# Patient Record
Sex: Male | Born: 1960
Health system: Southern US, Community
[De-identification: ages and names within clinical notes are randomized; demographics above are authoritative.]

## PROBLEM LIST (undated history)

## (undated) DIAGNOSIS — Z8619 Personal history of other infectious and parasitic diseases: Secondary | ICD-10-CM

## (undated) DIAGNOSIS — R739 Hyperglycemia, unspecified: Principal | ICD-10-CM

## (undated) DIAGNOSIS — N433 Hydrocele, unspecified: Secondary | ICD-10-CM

## (undated) DIAGNOSIS — T7840XA Allergy, unspecified, initial encounter: Secondary | ICD-10-CM

## (undated) DIAGNOSIS — I10 Essential (primary) hypertension: Secondary | ICD-10-CM

## (undated) DIAGNOSIS — N50812 Left testicular pain: Secondary | ICD-10-CM

## (undated) DIAGNOSIS — Z0000 Encounter for general adult medical examination without abnormal findings: Secondary | ICD-10-CM

## (undated) DIAGNOSIS — E785 Hyperlipidemia, unspecified: Secondary | ICD-10-CM

## (undated) DIAGNOSIS — D1720 Benign lipomatous neoplasm of skin and subcutaneous tissue of unspecified limb: Secondary | ICD-10-CM

## (undated) DIAGNOSIS — K219 Gastro-esophageal reflux disease without esophagitis: Secondary | ICD-10-CM

## (undated) HISTORY — DX: Personal history of other infectious and parasitic diseases: Z86.19

## (undated) HISTORY — DX: Gastro-esophageal reflux disease without esophagitis: K21.9

## (undated) HISTORY — PX: HERNIA REPAIR: SHX51

## (undated) HISTORY — DX: Hyperlipidemia, unspecified: E78.5

## (undated) HISTORY — DX: Allergy, unspecified, initial encounter: T78.40XA

## (undated) HISTORY — PX: WISDOM TOOTH EXTRACTION: SHX21

## (undated) HISTORY — DX: Hydrocele, unspecified: N43.3

## (undated) HISTORY — DX: Encounter for general adult medical examination without abnormal findings: Z00.00

## (undated) HISTORY — DX: Hyperglycemia, unspecified: R73.9

## (undated) HISTORY — DX: Essential (primary) hypertension: I10

## (undated) HISTORY — DX: Left testicular pain: N50.812

## (undated) HISTORY — PX: EYE SURGERY: SHX253

## (undated) HISTORY — DX: Benign lipomatous neoplasm of skin and subcutaneous tissue of unspecified limb: D17.20

---
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
## 1998-02-02 ENCOUNTER — Ambulatory Visit (HOSPITAL_BASED_OUTPATIENT_CLINIC_OR_DEPARTMENT_OTHER): Admission: RE | Admit: 1998-02-02 | Discharge: 1998-02-02 | Payer: Self-pay | Admitting: General Surgery

## 1999-03-21 ENCOUNTER — Encounter: Payer: Self-pay | Admitting: Family Medicine

## 1999-03-21 ENCOUNTER — Encounter: Admission: RE | Admit: 1999-03-21 | Discharge: 1999-03-21 | Payer: Self-pay | Admitting: Family Medicine

## 2003-03-23 ENCOUNTER — Encounter: Payer: Self-pay | Admitting: Internal Medicine

## 2003-03-30 ENCOUNTER — Encounter: Payer: Self-pay | Admitting: Internal Medicine

## 2007-08-24 ENCOUNTER — Encounter: Payer: Self-pay | Admitting: Internal Medicine

## 2008-02-03 ENCOUNTER — Ambulatory Visit: Payer: Self-pay | Admitting: Internal Medicine

## 2008-02-03 DIAGNOSIS — E782 Mixed hyperlipidemia: Secondary | ICD-10-CM | POA: Insufficient documentation

## 2008-02-03 DIAGNOSIS — J309 Allergic rhinitis, unspecified: Secondary | ICD-10-CM | POA: Insufficient documentation

## 2008-02-03 DIAGNOSIS — K219 Gastro-esophageal reflux disease without esophagitis: Secondary | ICD-10-CM | POA: Insufficient documentation

## 2008-02-03 DIAGNOSIS — E669 Obesity, unspecified: Secondary | ICD-10-CM | POA: Insufficient documentation

## 2008-02-03 DIAGNOSIS — I10 Essential (primary) hypertension: Secondary | ICD-10-CM | POA: Insufficient documentation

## 2008-02-03 LAB — CONVERTED CEMR LAB
ALT: 56 units/L — ABNORMAL HIGH (ref 0–53)
AST: 42 units/L — ABNORMAL HIGH (ref 0–37)
Albumin: 4.6 g/dL (ref 3.5–5.2)
Alkaline Phosphatase: 47 units/L (ref 39–117)
BUN: 18 mg/dL (ref 6–23)
Bilirubin, Direct: 0.3 mg/dL (ref 0.0–0.3)
CO2: 28 meq/L (ref 19–32)
Calcium: 9.9 mg/dL (ref 8.4–10.5)
Chloride: 100 meq/L (ref 96–112)
Cholesterol: 129 mg/dL (ref 0–200)
Creatinine, Ser: 1.4 mg/dL (ref 0.4–1.5)
GFR calc Af Amer: 70 mL/min
GFR calc non Af Amer: 58 mL/min
Glucose, Bld: 125 mg/dL — ABNORMAL HIGH (ref 70–99)
HDL: 30.7 mg/dL — ABNORMAL LOW (ref >39.0)
Hgb A1c MFr Bld: 6.1 % — ABNORMAL HIGH (ref 4.6–6.0)
LDL Cholesterol: 79 mg/dL (ref 0–99)
Potassium: 3.8 meq/L (ref 3.5–5.1)
Sodium: 138 meq/L (ref 135–145)
TSH: 1.16 microintl units/mL (ref 0.35–5.50)
Total Bilirubin: 1.5 mg/dL — ABNORMAL HIGH (ref 0.3–1.2)
Total CHOL/HDL Ratio: 4.2
Total Protein: 7.8 g/dL (ref 6.0–8.3)
Triglycerides: 97 mg/dL (ref 0–149)
VLDL: 19 mg/dL (ref 0–40)

## 2008-07-19 ENCOUNTER — Telehealth (INDEPENDENT_AMBULATORY_CARE_PROVIDER_SITE_OTHER): Payer: Self-pay | Admitting: *Deleted

## 2008-08-15 ENCOUNTER — Ambulatory Visit: Payer: Self-pay | Admitting: Internal Medicine

## 2008-08-15 DIAGNOSIS — R739 Hyperglycemia, unspecified: Secondary | ICD-10-CM | POA: Insufficient documentation

## 2008-08-15 DIAGNOSIS — E1169 Type 2 diabetes mellitus with other specified complication: Secondary | ICD-10-CM | POA: Insufficient documentation

## 2008-08-15 HISTORY — DX: Hyperglycemia, unspecified: R73.9

## 2008-11-09 ENCOUNTER — Ambulatory Visit: Payer: Self-pay | Admitting: Internal Medicine

## 2008-11-09 LAB — CONVERTED CEMR LAB
ALT: 37 units/L (ref 0–53)
AST: 26 units/L (ref 0–37)
Albumin: 4.7 g/dL (ref 3.5–5.2)
Alkaline Phosphatase: 49 units/L (ref 39–117)
BUN: 14 mg/dL (ref 6–23)
Bilirubin, Direct: 0.2 mg/dL (ref 0.0–0.3)
CO2: 25 meq/L (ref 19–32)
Calcium: 9.7 mg/dL (ref 8.4–10.5)
Chloride: 100 meq/L (ref 96–112)
Creatinine, Ser: 1.21 mg/dL (ref 0.40–1.50)
Glucose, Bld: 105 mg/dL — ABNORMAL HIGH (ref 70–99)
Hgb A1c MFr Bld: 5.9 % (ref 4.6–6.1)
Indirect Bilirubin: 0.4 mg/dL (ref 0.0–0.9)
Potassium: 4.8 meq/L (ref 3.5–5.3)
Sodium: 138 meq/L (ref 135–145)
Total Bilirubin: 0.6 mg/dL (ref 0.3–1.2)
Total Protein: 7.2 g/dL (ref 6.0–8.3)

## 2008-11-16 ENCOUNTER — Ambulatory Visit: Payer: Self-pay | Admitting: Internal Medicine

## 2008-11-16 DIAGNOSIS — R0789 Other chest pain: Secondary | ICD-10-CM | POA: Insufficient documentation

## 2009-03-14 ENCOUNTER — Ambulatory Visit: Payer: Self-pay | Admitting: Internal Medicine

## 2009-03-14 LAB — CONVERTED CEMR LAB
ALT: 39 units/L (ref 0–53)
AST: 23 units/L (ref 0–37)
Albumin: 4.4 g/dL (ref 3.5–5.2)
Alkaline Phosphatase: 46 units/L (ref 39–117)
BUN: 15 mg/dL (ref 6–23)
Bilirubin, Direct: 0.2 mg/dL (ref 0.0–0.3)
CO2: 25 meq/L (ref 19–32)
Calcium: 9.1 mg/dL (ref 8.4–10.5)
Chloride: 101 meq/L (ref 96–112)
Cholesterol: 125 mg/dL (ref 0–200)
Creatinine, Ser: 1.14 mg/dL (ref 0.40–1.50)
Glucose, Bld: 105 mg/dL — ABNORMAL HIGH (ref 70–99)
HDL: 34 mg/dL — ABNORMAL LOW (ref >39)
Hgb A1c MFr Bld: 6 % (ref 4.6–6.1)
Indirect Bilirubin: 0.5 mg/dL (ref 0.0–0.9)
LDL Cholesterol: 56 mg/dL (ref 0–99)
Potassium: 4.5 meq/L (ref 3.5–5.3)
Sodium: 140 meq/L (ref 135–145)
Total Bilirubin: 0.7 mg/dL (ref 0.3–1.2)
Total CHOL/HDL Ratio: 3.7
Total Protein: 6.7 g/dL (ref 6.0–8.3)
Triglycerides: 173 mg/dL — ABNORMAL HIGH (ref <150)
VLDL: 35 mg/dL (ref 0–40)

## 2009-03-16 ENCOUNTER — Ambulatory Visit: Payer: Self-pay | Admitting: Internal Medicine

## 2009-08-24 ENCOUNTER — Encounter: Payer: Self-pay | Admitting: Internal Medicine

## 2009-08-24 LAB — CONVERTED CEMR LAB
BUN: 15 mg/dL (ref 6–23)
CO2: 27 meq/L (ref 19–32)
Calcium: 9.2 mg/dL (ref 8.4–10.5)
Chloride: 102 meq/L (ref 96–112)
Creatinine, Ser: 1.28 mg/dL (ref 0.40–1.50)
Glucose, Bld: 106 mg/dL — ABNORMAL HIGH (ref 70–99)
Hgb A1c MFr Bld: 5.7 % — ABNORMAL HIGH (ref <5.7)
Potassium: 4.6 meq/L (ref 3.5–5.3)
Sodium: 137 meq/L (ref 135–145)

## 2009-08-29 ENCOUNTER — Ambulatory Visit: Payer: Self-pay | Admitting: Internal Medicine

## 2009-10-01 ENCOUNTER — Telehealth: Payer: Self-pay | Admitting: Internal Medicine

## 2010-02-05 NOTE — Assessment & Plan Note (Signed)
Summary: 6 month follow up/mhf   Vital Signs:  Patient profile:   50 year old male Height:      71 inches Weight:      300.50 pounds BMI:     42.06 O2 Sat:      96 % on Room air Temp:     98.3 degrees F oral Pulse rate:   75 / minute Pulse rhythm:   regular Resp:     16 per minute BP sitting:   122 / 80  (right arm) Cuff size:   large  Vitals Entered By: Glendell Docker CMA (August 29, 2009 8:04 AM)  O2 Flow:  Room air CC: 6 month follow up Is Patient Diabetic? Yes Pain Assessment Patient in pain? no      [facility]   Primary Care Provider:  D. Thomos Lemons DO  CC:  6 month follow up.  History of Present Illness:  Hypertension Follow-Up      This is a 50 year old man who presents for Hypertension follow-up.  The patient denies lightheadedness and edema.  The patient denies the following associated symptoms: chest pain.  Compliance with medications (by patient report) has been near 100%.  The patient reports that dietary compliance has been fair.    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   Head:  normocephalic and atraumatic.   Neck:  No deformities, masses, or tenderness noted.no carotid bruits.   Lungs:  normal respiratory effort and normal breath sounds.   Heart:   normal rate, regular rhythm, no murmur, and no gallop.   Extremities:  trace left pedal edema and trace right pedal edema.     Impression & Recommendations:  Problem # 1:  DIABETES MELLITUS, TYPE II, BORDERLINE (ICD-790.29) Assessment Unchanged  His updated medication list for this problem includes:    Metformin Hcl 500 Mg Tabs (Metformin hcl) ..... One by mouth three times a day  Labs Reviewed: Creat: 1.28 (08/24/2009)     Problem # 2:  HYPERTENSION (ICD-401.9) Assessment: Unchanged  His updated medication list for this problem includes:    Chlorthalidone 25 Mg Tabs (Chlorthalidone) .Marland Kitchen... Take 1 tablet by mouth once a day    Lisinopril 20 Mg Tabs (Lisinopril) .Marland Kitchen... Take 1 tablet by mouth once a day  BP today: 122/80 Prior BP: 122/80 (03/16/2009)  Labs Reviewed: K+: 4.6 (08/24/2009) Creat: : 1.28 (08/24/2009)   Chol: 125 (03/14/2009)   HDL: 34 (03/14/2009)   LDL: 56 (03/14/2009)   TG: 173 (03/14/2009)  Complete Medication List: 1)  Chlorthalidone 25 Mg Tabs (Chlorthalidone) .... Take 1 tablet by mouth once a day 2)  Lipitor 10 Mg Tabs (Atorvastatin calcium) .... Take 1 tablet by mouth once a  day 3)  Lisinopril 20 Mg Tabs (Lisinopril) .... Take 1 tablet by mouth once a day 4)  Flonase 50 Mcg/act Susp (Fluticasone propionate) .... 2 sprays each nostril once daily as needed 5)  Proair Hfa 108 (90 Base) Mcg/act Aers (Albuterol sulfate) .Marland Kitchen.. 1-2 puffs by mouth once daily as needed 6)  Metformin Hcl 500 Mg Tabs (Metformin hcl) .... One by mouth three times a day  Patient Instructions: 1)  [email or web address]-calorie-counter.[email or web address]/ 2)  Please schedule a follow-up appointment in 6 months. 3)  BMP prior to visit, ICD-9:  401.9 4)  HbgA1C prior to visit, ICD-9:  790.29 5)  FLP, AST, ALT: 272.4 6)  Please return for lab work one (1) week before your next appointment.  Prescriptions: METFORMIN HCL 500 MG TABS (METFORMIN HCL) one by mouth three times a day  #270 x 1   Entered and Authorized  by:   D. Thomos Lemons DO   Signed by:   D. Thomos Lemons DO on 08/29/2009   Method used:   [facility]        [facility] [phone]* (retail)       [address]       [place], [place]  [address]       Ph: [phone]       Fax: [phone]   RxID:   [id number] LISINOPRIL 20 MG TABS (LISINOPRIL) Take 1 tablet by mouth once a day  #90 x 1   Entered and Authorized by:   D. Thomos Lemons DO   Signed by:   D. Thomos Lemons DO on 08/29/2009   Method used:   [facility] to        [facility] [phone]* (retail)       [address]       [place], [place]  [address]       Ph: [phone]       Fax: [phone]   RxID:   [id number] LIPITOR 10 MG TABS (ATORVASTATIN CALCIUM) Take 1 tablet by mouth once a day  #90 x 1   Entered and Authorized by:   D. Thomos Lemons DO   Signed by:   D. Thomos Lemons DO on 08/29/2009   Method used:   [facility]ically to        [facility] [phone]* (retail)       [address]       [place], [place]  [address]       Ph: [phone]       Fax: [phone]   RxID:   [id number] CHLORTHALIDONE 25 MG TABS (CHLORTHALIDONE) Take 1 tablet by mouth once a day  #90 x 1   Entered and Authorized by:   D. Thomos Lemons DO   Signed by:   D. Thomos Lemons DO on 08/29/2009   Method used:   [facility] to        [facility] [phone]* (retail)       [address]       [place], [place]  [address]       Ph: [phone]       Fax: [phone]   RxID:   [id number]   Current Allergies (reviewed today): No known allergies   Prevention & Chronic Care Immunizations   Influenza vaccine: Fluvax Non-MCR  (10/06/2008)  Tetanus booster: 02/03/2008: Tdap    Pneumococcal vaccine: Not documented  Other Screening   Smoking status: never  (08/29/2009)  Lipids   Total Cholesterol: 125  (03/14/2009)   LDL: 56  (03/14/2009)   LDL Direct: Not  documented   HDL: 34  (03/14/2009)   Triglycerides: 173  (03/14/2009)    SGOT (AST): 23  (03/14/2009)   SGPT (ALT): 39  (03/14/2009)   Alkaline phosphatase: 46  (03/14/2009)   Total bilirubin: 0.7  (03/14/2009)  Hypertension   Last Blood Pressure: 122 / 80  (08/29/2009)   Serum creatinine: 1.28  (08/24/2009)   Serum potassium 4.6  (08/24/2009)  Self-Management Support :    Hypertension self-management support: Not documented    Lipid self-management support: Not documented

## 2010-02-05 NOTE — Assessment & Plan Note (Signed)
Summary: 3 MONTH FOLLOW UP/MHF rsc with pt from bump/mhf   Vital Signs:  Patient profile:   50 year old male Weight:      318.50 pounds BMI:     44.58 O2 Sat:      97 % on Room air Temp:     98.2 degrees F oral Pulse rate:   81 / minute Pulse rhythm:   regular Resp:     20 per minute BP sitting:   122 / 80  (right arm) Cuff size:   large  Vitals Entered By: Glendell Docker CMA (March 16, 2009 8:05 AM)  O2 Flow:  Room air CC: Rm 2- 3 Month Folllow up , Type 2 diabetes mellitus follow-up Is Patient Diabetic? No Comments no concerns, refills on flonase and pro air   Primary Care Provider:  DThomos Lemons DO  CC:  Rm 2- 3 Month Folllow up  and Type 2 diabetes mellitus follow-up.  History of Present Illness:        This is a 50 year old man who presents for Type 2 diabetes mellitus borderline follow-up.  The patient reports weight gain.  The patient denies the following symptoms: chest pain.  Since the last visit the patient reports good dietary compliance, noncompliance with medications, and not exercising regularly.  He stopped drinking sodas.  Htn - stable. occ gets gout flare  Hyperlipidemia - stable.  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   Lungs:  normal respiratory effort and normal breath sounds.   Heart:  normal rate, regular rhythm, no murmur, and no gallop.   Extremities:  trace left pedal edema and trace right pedal edema.   Psych:  normally interactive and good eye contact.     Impression & Recommendations:  Problem  # 1:  DIABETES MELLITUS, TYPE II, BORDERLINE (ICD-790.29) He hasn't been taking consistently due to nausea.  pt will take 1/2 tab is afternoon.  Pt advised to join wt watchers.  we discussed option of wt loss surgery.  His updated medication list for this problem includes:    Metformin Hcl 500 Mg Tabs (Metformin hcl) ..... One by mouth three times a day  Problem # 2:  HYPERTENSION (ICD-401.9) well controlled.  he gets occ gout flares (2-3 per yr).  if more frequent gout flares, replace chlorthalidone with amlodipine  His updated medication list for this problem includes:    Chlorthalidone 25 Mg Tabs (Chlorthalidone) .Marland Kitchen... Take 1 tablet by mouth once a day    Lisinopril 20 Mg Tabs (Lisinopril) .Marland Kitchen... Take 1 tablet by mouth once a day  BP today: 122/80 Prior BP: 124/80 (11/16/2008)  Labs Reviewed: K+: 4.5 (03/14/2009) Creat: : 1.14 (03/14/2009)   Chol: 125 (03/14/2009)   HDL: 34 (03/14/2009)   LDL: 56 (03/14/2009)   TG: 173 (03/14/2009)  Problem # 3:  HYPERLIPIDEMIA (ICD-272.4) well controlled.  Maintain current medication regimen.  His updated medication list for this problem includes:    Lipitor 10 Mg Tabs (Atorvastatin calcium) .Marland Kitchen... Take 1 tablet  by mouth once a day  Labs Reviewed: SGOT: 23 (03/14/2009)   SGPT: 39 (03/14/2009)   HDL:34 (03/14/2009), 30.7 (02/03/2008)  LDL:56 (03/14/2009), 79 (02/03/2008)  Chol:125 (03/14/2009), 129 (02/03/2008)  Trig:173 (03/14/2009), 97 (02/03/2008)  Complete Medication List: 1)  Chlorthalidone 25 Mg Tabs (Chlorthalidone) .... Take 1 tablet by mouth once a day 2)  Lipitor 10 Mg Tabs (Atorvastatin calcium) .... Take 1 tablet by mouth once a day 3)  Lisinopril 20 Mg Tabs (Lisinopril) .... Take 1 tablet by mouth once a day 4)  Flonase 50 Mcg/act Susp (Fluticasone propionate) .... 2 sprays each nostril once daily as needed 5)  Proair Hfa 108 (90 Base) Mcg/act Aers (Albuterol sulfate) .Marland Kitchen.. 1-2 puffs by mouth once daily as needed 6)  Metformin Hcl 500  Mg Tabs (Metformin hcl) .... One by mouth three times a day  Patient Instructions: 1)  Follow up visit between July and August of 2011 2)  BMP prior to visit, ICD-9:  401.9 3)  HbgA1C prior to visit, ICD-9: 790.29 4)  Please return for lab work one (1) week before your next appointment.  Prescriptions: PROAIR HFA 108 (90 BASE) MCG/ACT AERS (ALBUTEROL SULFATE) 1-2 puffs by mouth once daily as needed  #1 x 5   Entered and Authorized by:   D. Thomos Lemons DO   Signed by:   D. Thomos Lemons DO on 03/16/2009   Method used:   [facility] to        [facility] [phone]* (retail)       [address]       [place], [place]  [address]       Ph: [phone]       Fax: [phone]   RxID:   [id number] FLONASE 50 MCG/ACT SUSP (FLUTICASONE PROPIONATE) 2 sprays each nostril once daily as needed  #1 x 5   Entered and Authorized by:   D. Thomos Lemons DO   Signed by:   D. Thomos Lemons DO on 03/16/2009   Method used:   Electronically to        [facility] [phone]* (retail)       [address]       [place], [place]  [address]       Ph: [phone]       Fax: [phone]   RxID:   [id number] METFORMIN HCL 500 MG TABS (METFORMIN HCL) one by mouth three times a day  #90 x 3   Entered and Authorized by:   D. Thomos Lemons DO   Signed by:   D. Thomos Lemons DO on 03/16/2009   Method used:   [facility]ronically to        [facility] [phone]* (retail)       [address]       [place], [place]  [address]       Ph: [phone]       Fax: [phone]   RxID:   [id number] LIPITOR 10 MG TABS (ATORVASTATIN CALCIUM) Take 1 tablet by mouth once a day  #30 x 5   Entered and Authorized by:   D. Thomos Lemons DO   Signed by:   D. Thomos Lemons DO on 03/16/2009   Method used:   Electronically to        [facility] [phone]* (retail)  [address]       [place], [place]  [address]       Ph:  [phone]       Fax: [phone]   RxID:   [id number] LISINOPRIL 20 MG TABS (LISINOPRIL) Take 1 tablet by mouth once a day  #30 x 5   Entered and Authorized by:   D. Thomos Lemons DO   Signed by:   D. Thomos Lemons DO on 03/16/2009   Method used:   Electronically to        [facility] [phone]* (retail)       [address]       [place], [place]  [address]       Ph: [phone]       Fax: [phone]   RxID:   [id number] CHLORTHALIDONE 25 MG TABS (CHLORTHALIDONE) Take 1 tablet by mouth once a day  #30 x 5   Entered and Authorized by:   D. Thomos Lemons DO   Signed by:   D. Thomos Lemons DO on 03/16/2009   Method used:   Electronically to        [facility] [phone]* (retail)       [address]       [place], [place]  [address]       Ph: [phone]       Fax: [phone]   RxID:   [id number]   Current Allergies (reviewed today): No known allergies

## 2010-02-05 NOTE — Miscellaneous (Signed)
Summary: Orders Update  Clinical Lists Changes  Orders: Added new Test order of T-Basic Metabolic Panel ([id number]) - Signed Added new Test order of T- Hemoglobin A1C ([id number]) - Signed 

## 2010-02-05 NOTE — Progress Notes (Signed)
Summary: Refill from 8/24 appt  Phone Note Call from Patient Call back at [phone]   Caller: Patient Reason for Call: Refill Medication Summary of Call: Pt did tried to pick up meds today from 08/29/09 appt and pharmacy did not have them, [facility] [place] not [facility] Initial call taken by: Lannette Donath,  October 01, 2009 2:29 PM  Follow-up for Phone Call        prescriptions that were sent on 8/24, verifed with pharmacist, they did not recieve all of the medications that were previoulsy sent. Rxs information has been provided to pharmacist for medicatiions that were sent by Dr Artist Pais in August for Metformin, Lisinopril, Chlorthalidone, and  Lipitor. Call returned to patient, he is aware rx's have been called to pharmacy Follow-up by: Glendell Docker CMA,  October 01, 2009 5:03 PM

## 2010-02-08 ENCOUNTER — Telehealth: Payer: Self-pay | Admitting: Internal Medicine

## 2010-02-13 LAB — CONVERTED CEMR LAB
BUN: 20 mg/dL (ref 6–23)
Chloride: 101 meq/L (ref 96–112)
Glucose, Bld: 114 mg/dL — ABNORMAL HIGH (ref 70–99)
Hgb A1c MFr Bld: 5.6 % (ref <5.7)
LDL Cholesterol: 77 mg/dL (ref 0–99)
Potassium: 4.4 meq/L (ref 3.5–5.3)
Triglycerides: 137 mg/dL (ref <150)
VLDL: 27 mg/dL (ref 0–40)

## 2010-02-13 NOTE — Progress Notes (Signed)
Summary: question on blood work  Phone Note Call from Patient   Caller: Patient Call For: darlene Reason for Call: Talk to Nurse Summary of Call: pt has appt on 02.09.12 with dr. Artist Pais. he was wondering if he needs to come in early for bloodwork. phone# [phone]. please assist. Initial call taken by: Elba Barman,  February 08, 2010 3:10 PM  Follow-up for Phone Call        call returned to patient at [phone], he has been advised of fasting blood work. He states that he wil be out of town on Monday and Tuesday and will have blood work drawn on Wednesday morning in [facility]. Order printed and sent to lab Follow-up by: Glendell Docker CMA,  February 08, 2010 4:25 PM

## 2010-02-14 ENCOUNTER — Encounter: Payer: Self-pay | Admitting: Internal Medicine

## 2010-02-14 ENCOUNTER — Ambulatory Visit (INDEPENDENT_AMBULATORY_CARE_PROVIDER_SITE_OTHER): Payer: BC Managed Care – PPO | Admitting: Internal Medicine

## 2010-02-14 DIAGNOSIS — D179 Benign lipomatous neoplasm, unspecified: Secondary | ICD-10-CM | POA: Insufficient documentation

## 2010-02-14 DIAGNOSIS — R7309 Other abnormal glucose: Secondary | ICD-10-CM

## 2010-02-14 DIAGNOSIS — E785 Hyperlipidemia, unspecified: Secondary | ICD-10-CM

## 2010-02-14 DIAGNOSIS — I10 Essential (primary) hypertension: Secondary | ICD-10-CM

## 2010-02-15 ENCOUNTER — Encounter (INDEPENDENT_AMBULATORY_CARE_PROVIDER_SITE_OTHER): Payer: Self-pay | Admitting: *Deleted

## 2010-02-19 ENCOUNTER — Encounter: Payer: Self-pay | Admitting: Internal Medicine

## 2010-02-21 NOTE — Letter (Signed)
Summary: Primary Care Consult Scheduled Letter  [place] [facility]  [address]   [place], [place] [address]   Phone: [phone]  Fax: [phone]      02/15/2010 MRN: [id number]  Barry Anthony [address] [place], [place]  [address]  [place]    Dear Mr. ROSOL,      We have scheduled an appointment for you.  At the recommendation of Dr.YOO, we have scheduled you a consult with DR Lindie Spruce, [facility] [place] at 10AM .  Their address is_[address], [place]  . The office phone number is [phone].  If this appointment day and time is not convenient for you, please feel free to call the office of the doctor you are being referred to at the number listed above and reschedule the appointment.     It is important for you to keep your scheduled appointments. We are here to make sure you are given good patient care.     Thank you, Darral Dash Patient Care Coordinator [place] [facility]

## 2010-03-05 NOTE — Letter (Signed)
Summary: [facility]   Imported By: Maryln Gottron 02/28/2010 15:59:24  _____________________________________________________________________  External Attachment:    Type:   Image     Comment:   External Document

## 2010-03-05 NOTE — Assessment & Plan Note (Signed)
Summary: 6 month fu/mhf   Vital Signs:  Patient profile:   50 year old male Height:      71 inches Weight:      298.25 pounds BMI:     41.75 O2 Sat:      97 % on Room air Temp:     98.1 degrees F oral Pulse rate:   84 / minute Resp:     16 per minute BP sitting:   120 / 64  (right arm) Cuff size:   large  Vitals Entered By: Glendell Docker CMA (February 14, 2010 8:11 AM)  O2 Flow:  Room air CC: 6 Month Follow up Is Patient Diabetic? No Pain Assessment Patient in pain? no        Primary Care Provider:  Dondra Spry DO  CC:  6 Month Follow up.  History of Present Illness:  50 y/o male co lump on right shoulder size of ping pong ball he first noticed 2 months ago  Htn - stable  DM II -   Preventive Screening-Counseling & Management  Alcohol-Tobacco     Smoking Status: never  Allergies (verified): No Known Drug Allergies  Past History:  Past Medical History: Allergic rhinitis GERD    Hyperlipidemia  Hypertension   Childhood chickenpox  Past Surgical History: Right knee surgery 1981 Tonsillectomy 1969        Physical Exam  General:  alert, well-developed, and well-nourished.   Lungs:  normal respiratory effort and normal breath sounds.   Heart:  normal rate, regular rhythm, and no gallop.   Neurologic:  cranial nerves II-XII intact.   Skin:  3-4 cm fatty lesion on right shouder   Impression & Recommendations:  Problem # 1:  LIPOMA (ICD-214.9)  pt with small golf ball sized lesion on right shoulder arrange surgical excision  Orders: Surgical Referral (Surgery)  Problem # 2:  DIABETES MELLITUS, TYPE II, BORDERLINE (ICD-790.29) Assessment: Improved exericising much more.  mild wt loss pt will start restricting calorie intake continue metformin for now  His updated medication list for this problem includes:    Metformin Hcl 500 Mg Tabs (Metformin hcl) ..... One by mouth three times a day  Labs Reviewed: Creat: 1.28 (08/24/2009)      Problem # 3:  HYPERLIPIDEMIA (ICD-272.4)  His updated medication list for this problem includes:    Lipitor 10 Mg Tabs (Atorvastatin calcium) .Marland Kitchen... Take 1 tablet by mouth once a day  Labs Reviewed: SGOT: 23 (03/14/2009)   SGPT: 39 (03/14/2009)   HDL:34 (03/14/2009), 30.7 (02/03/2008)  LDL:56 (03/14/2009), 79 (02/03/2008)  Chol:125 (03/14/2009), 129 (02/03/2008)  Trig:173 (03/14/2009), 97 (02/03/2008)  Problem # 4:  HYPERTENSION (ICD-401.9) Assessment: Unchanged  His updated medication list for this problem includes:    Chlorthalidone 25 Mg Tabs (Chlorthalidone) .Marland Kitchen... Take 1 tablet by mouth once a day    Lisinopril 20 Mg Tabs (Lisinopril) .Marland Kitchen... Take 1 tablet by mouth once a day  BP today: 120/64 Prior BP: 122/80 (08/29/2009)  Labs Reviewed: K+: 4.6 (08/24/2009) Creat: : 1.28 (08/24/2009)   Chol: 125 (03/14/2009)   HDL: 34 (03/14/2009)   LDL: 56 (03/14/2009)   TG: 173 (03/14/2009)  Complete Medication List: 1)  Chlorthalidone 25 Mg Tabs (Chlorthalidone) .... Take 1 tablet by mouth once a day 2)  Lipitor 10 Mg Tabs (Atorvastatin calcium) .... Take 1 tablet by mouth once a day 3)  Lisinopril 20 Mg Tabs (Lisinopril) .... Take 1 tablet by mouth once a day 4)  Flonase 50 Mcg/act  Susp (Fluticasone propionate) .... 2 sprays each nostril once daily as needed 5)  Proair Hfa 108 (90 Base) Mcg/act Aers (Albuterol sulfate) .Marland Kitchen.. 1-2 puffs by mouth once daily as needed 6)  Metformin Hcl 500 Mg Tabs (Metformin hcl) .... One by mouth three times a day  Patient Instructions: 1)  Please schedule a follow-up appointment in 4 months. 2)  BMP prior to visit, ICD-9:  401.9 3)  HbgA1C prior to visit, ICD-9: 790.29 4)  Please return for lab work one (1) week before your next appointment.  Prescriptions: METFORMIN HCL 500 MG TABS (METFORMIN HCL) one by mouth three times a day  #270 x 1   Entered and Authorized by:   D. Thomos Lemons DO   Signed by:   D. Thomos Lemons DO on 02/14/2010   Method used:    [facility] to        [facility] [phone]* (retail)       [address]       [place], [place]  [address]       Ph: [phone]       Fax: [phone]   RxID:   [id number] LISINOPRIL 20 MG TABS (LISINOPRIL) Take 1 tablet by mouth once a day  #90 x 1   Entered and Authorized by:   D. Thomos Lemons DO   Signed by:   D. Thomos Lemons DO on 02/14/2010   Method used:   Electronically to        [facility] [phone]* (retail)       [address]       [place], [place]  [address]       Ph: [phone]       Fax: [phone]   RxID:   [id number] LIPITOR 10 MG TABS (ATORVASTATIN CALCIUM) Take 1 tablet by mouth once a day  #90 x 1   Entered and Authorized by:   D. Thomos Lemons DO   Signed by:   D. Thomos Lemons DO on 02/14/2010   Method used:   Electronically to        [facility] [phone]* (retail)       [address]       [place], [place]  [address]       Ph: [phone]       Fax: [phone]   RxID:   [id number] CHLORTHALIDONE 25 MG TABS (CHLORTHALIDONE) Take 1 tablet by mouth once a day  #90 x 1   Entered and Authorized by:   D. Thomos Lemons DO   Signed by:   D. Thomos Lemons DO on 02/14/2010   Method used:   Electronically to        [facility] [phone]* (retail)       [address]       [place], [place]  [address]       Ph: [phone]       Fax: [phone]   RxID:   [id number]    Orders Added: 1)  Surgical Referral [Surgery] 2)  Est. Patient Level III [[address]]   Immunization History:  Influenza Immunization History:    Influenza:  historical (11/07/2009)   Immunization History:  Influenza Immunization History:    Influenza:  Historical (11/07/2009)  Current Allergies (reviewed today): No known allergies

## 2010-03-27 ENCOUNTER — Encounter (HOSPITAL_BASED_OUTPATIENT_CLINIC_OR_DEPARTMENT_OTHER)
Admission: RE | Admit: 2010-03-27 | Discharge: 2010-03-27 | Disposition: A | Discharge status: Home / Self Care | Payer: BC Managed Care – PPO | Source: Ambulatory Visit | Attending: General Surgery | Admitting: General Surgery

## 2010-03-27 LAB — CBC
MCH: 29.3 pg (ref 26.0–34.0)
MCHC: 34.9 g/dL (ref 30.0–36.0)
MCV: 84.1 fL (ref 78.0–100.0)
Platelets: 241 10*3/uL (ref 150–400)
RDW: 12.9 % (ref 11.5–15.5)
WBC: 9.9 10*3/uL (ref 4.0–10.5)

## 2010-03-27 LAB — BASIC METABOLIC PANEL
BUN: 15 mg/dL (ref 6–23)
Calcium: 9.3 mg/dL (ref 8.4–10.5)
Creatinine, Ser: 1.3 mg/dL (ref 0.4–1.5)
GFR calc non Af Amer: 59 mL/min — ABNORMAL LOW (ref >60)

## 2010-03-27 LAB — DIFFERENTIAL
Eosinophils Absolute: 0.2 10*3/uL (ref 0.0–0.7)
Eosinophils Relative: 2 % (ref 0–5)
Lymphs Abs: 3.7 10*3/uL (ref 0.7–4.0)
Monocytes Relative: 8 % (ref 3–12)

## 2010-03-29 ENCOUNTER — Ambulatory Visit (HOSPITAL_BASED_OUTPATIENT_CLINIC_OR_DEPARTMENT_OTHER)
Admission: RE | Admit: 2010-03-29 | Discharge: 2010-03-29 | Disposition: A | Discharge status: Home / Self Care | Payer: BC Managed Care – PPO | Source: Ambulatory Visit | Attending: General Surgery | Admitting: General Surgery

## 2010-03-29 ENCOUNTER — Other Ambulatory Visit: Payer: Self-pay | Admitting: General Surgery

## 2010-03-29 DIAGNOSIS — Z01812 Encounter for preprocedural laboratory examination: Secondary | ICD-10-CM | POA: Insufficient documentation

## 2010-03-29 DIAGNOSIS — E119 Type 2 diabetes mellitus without complications: Secondary | ICD-10-CM | POA: Insufficient documentation

## 2010-03-29 DIAGNOSIS — I10 Essential (primary) hypertension: Secondary | ICD-10-CM | POA: Insufficient documentation

## 2010-03-29 DIAGNOSIS — D1739 Benign lipomatous neoplasm of skin and subcutaneous tissue of other sites: Secondary | ICD-10-CM | POA: Insufficient documentation

## 2010-03-29 LAB — GLUCOSE, CAPILLARY: Glucose-Capillary: 97 mg/dL (ref 70–99)

## 2010-04-10 NOTE — Op Note (Signed)
  NAMEJOEDY, Barry Anthony                 ACCOUNT NO.:  [id number]  MEDICAL RECORD NO.:  [id number]           PATIENT TYPE:  LOCATION:                                 FACILITY:  PHYSICIAN:  Cherylynn Ridges, M.D.         DATE OF BIRTH:  DATE OF PROCEDURE:  03/29/2010 DATE OF DISCHARGE:                              OPERATIVE REPORT   PREOPERATIVE DIAGNOSIS:  Right shoulder lipoma.  POSTOPERATIVE DIAGNOSIS:  Right shoulder lipoma, 4 cm in size.  PROCEDURE:  Excision of right shoulder lipoma.  SURGEON:  Cherylynn Ridges, MD  ANESTHESIA:  Monitored anesthesia care with 1% Xylocaine local.  ESTIMATED BLOOD LOSS:  Less than 10 mL.  COMPLICATIONS:  No complications.  CONDITION:  Stable.  FINDINGS:  A 4-cm lipoma with interdigitation.  INDICATION FOR OPERATION:  The patient is a 50 year old with symptomatic right shoulder lipoma with discomfort and pain especially with certain pieces of apparel who now comes in for excision.  OPERATION:  The patient was taken to the operating room, placed on table in supine position.  After an adequate amount of IV sedation was given, a proper time-out was performed identifying the patient and the procedure to be performed and was prepped and draped in usual sterile manner exposing his right shoulder.  The patient was tilted slightly up, but not to the left side.  We marked the area of the lipoma.  We then anesthetized using 1% Xylocaine local with bicarb added.  Next I directly crossed lipoma then made our incision diagonally directly on top of lipoma.  We dissect down to subcutaneous tissue until we got to the soft capsule of the lipoma, which we used as a guide to extend it circumferentially.  There was interdigitation then went into the subcutaneous tissue to the lateral aspect and down towards the chest wall only a small tongue of tissue was noted.  We excised everything in toto, obtained hemostasis, and closed in three layers, deep layer of 3-0  Vicryl and using a 4-0 Vicryl more superficial layer of 4-0 Vicryl.  Then the skin was closed using running subcuticular stitch of 4-0 Monocryl.  All counts were correct.  Sterile dressings applied including Dermabond, Steri-Strips, and Tegaderm.     Cherylynn Ridges, M.D.     JOW/MEDQ  D:  03/29/2010  T:  03/30/2010  Job:  [id number]  Electronically Signed by Jimmye Norman M.D. on 04/10/2010 05:24:36 PM

## 2010-05-15 ENCOUNTER — Encounter (INDEPENDENT_AMBULATORY_CARE_PROVIDER_SITE_OTHER): Payer: Self-pay | Admitting: General Surgery

## 2010-05-17 ENCOUNTER — Other Ambulatory Visit: Payer: Self-pay | Admitting: *Deleted

## 2010-05-17 DIAGNOSIS — I10 Essential (primary) hypertension: Secondary | ICD-10-CM

## 2010-05-17 DIAGNOSIS — R7309 Other abnormal glucose: Secondary | ICD-10-CM

## 2010-05-18 LAB — BASIC METABOLIC PANEL
BUN: 21 mg/dL (ref 6–23)
CO2: 26 mEq/L (ref 19–32)
Calcium: 9.3 mg/dL (ref 8.4–10.5)
Chloride: 101 mEq/L (ref 96–112)
Creat: 1.21 mg/dL (ref 0.40–1.50)

## 2010-05-18 LAB — HEMOGLOBIN A1C: Mean Plasma Glucose: 111 mg/dL (ref <117)

## 2010-05-20 ENCOUNTER — Encounter: Payer: Self-pay | Admitting: Internal Medicine

## 2010-05-21 ENCOUNTER — Ambulatory Visit: Payer: BC Managed Care – PPO | Admitting: Internal Medicine

## 2010-05-31 ENCOUNTER — Ambulatory Visit: Payer: BC Managed Care – PPO | Admitting: Internal Medicine

## 2010-08-06 ENCOUNTER — Ambulatory Visit: Payer: BC Managed Care – PPO | Admitting: Internal Medicine

## 2010-08-06 DIAGNOSIS — Z0289 Encounter for other administrative examinations: Secondary | ICD-10-CM

## 2010-08-08 ENCOUNTER — Other Ambulatory Visit: Payer: Self-pay | Admitting: Internal Medicine

## 2010-08-22 ENCOUNTER — Telehealth: Payer: Self-pay | Admitting: Internal Medicine

## 2010-08-22 NOTE — Telephone Encounter (Signed)
Refill- fluticasone prop spray. Use 2 sprays in each nostril once daily as needed. Qty 16.0gm. Last fill 4.11.11

## 2010-08-23 MED ORDER — FLUTICASONE PROPIONATE 50 MCG/ACT NA SUSP: 2.0000 | Freq: Every day | NASAL | Status: DC

## 2010-08-23 NOTE — Telephone Encounter (Signed)
Rx refill sent to pharmacy,

## 2010-08-27 ENCOUNTER — Encounter: Payer: Self-pay | Admitting: Internal Medicine

## 2010-08-27 ENCOUNTER — Ambulatory Visit (INDEPENDENT_AMBULATORY_CARE_PROVIDER_SITE_OTHER): Payer: BC Managed Care – PPO | Admitting: Internal Medicine

## 2010-08-27 VITALS — BP 130/80 | HR 87 | Temp 98.3°F | Resp 20 | Ht 71.0 in | Wt 284.0 lb

## 2010-08-27 DIAGNOSIS — J069 Acute upper respiratory infection, unspecified: Secondary | ICD-10-CM

## 2010-08-27 DIAGNOSIS — J029 Acute pharyngitis, unspecified: Secondary | ICD-10-CM

## 2010-08-27 LAB — POCT RAPID STREP A (OFFICE): Rapid Strep A Screen: NEGATIVE

## 2010-08-27 NOTE — Progress Notes (Signed)
  Subjective:    Patient ID: Barry Anthony, male    DOB: 28-Apr-1960, 50 y.o.   MRN: [id number]  HPI Pt presents to clinic as a work in for evaluation of  URI sx's. Notes 6d h/o nasal congestion/drainage, lg temperature, ST, mild cough and myalgias. Began zpak 2-3 days ago. No improvement. No known sick exposures. No alleviating or exacerbating factors. No other complaints.  Reviewed pmh, medications, and allergies    Review of Systems see hpi     Objective:   Physical Exam  Nursing note and vitals reviewed. Constitutional: He appears well-developed and well-nourished. No distress.  HENT:  Head: Normocephalic and atraumatic.  Right Ear: External ear and ear canal normal. Tympanic membrane is erythematous. Tympanic membrane is not perforated, not retracted and not bulging.  Left Ear: Tympanic membrane, external ear and ear canal normal.  Nose: Nose normal.  Mouth/Throat: Oropharynx is clear and moist. No oropharyngeal exudate.  Eyes: Conjunctivae are normal. Right eye exhibits no discharge. Left eye exhibits no discharge. No scleral icterus.  Neck: Neck supple.  Cardiovascular: Normal rate, regular rhythm and normal heart sounds.  Exam reveals no gallop and no friction rub.   No murmur heard. Pulmonary/Chest: Effort normal and breath sounds normal. No respiratory distress. He has no wheezes. He has no rales.  Lymphadenopathy:    He has no cervical adenopathy.  Neurological: He is alert.  Skin: Skin is warm and dry. He is not diaphoretic.  Psychiatric: He has a normal mood and affect.          Assessment & Plan:

## 2010-08-27 NOTE — Assessment & Plan Note (Signed)
Rapid strep neg. Continue zpak to completion. If sx's do not improve at end of abx consider alternative abx. Followup if no improvement or worsening.

## 2010-09-17 ENCOUNTER — Ambulatory Visit (INDEPENDENT_AMBULATORY_CARE_PROVIDER_SITE_OTHER): Payer: BC Managed Care – PPO | Admitting: Internal Medicine

## 2010-09-17 ENCOUNTER — Encounter: Payer: Self-pay | Admitting: Internal Medicine

## 2010-09-17 ENCOUNTER — Telehealth: Payer: Self-pay | Admitting: Internal Medicine

## 2010-09-17 DIAGNOSIS — M109 Gout, unspecified: Secondary | ICD-10-CM | POA: Insufficient documentation

## 2010-09-17 DIAGNOSIS — I10 Essential (primary) hypertension: Secondary | ICD-10-CM

## 2010-09-17 DIAGNOSIS — E785 Hyperlipidemia, unspecified: Secondary | ICD-10-CM

## 2010-09-17 DIAGNOSIS — Z79899 Other long term (current) drug therapy: Secondary | ICD-10-CM

## 2010-09-17 DIAGNOSIS — E119 Type 2 diabetes mellitus without complications: Secondary | ICD-10-CM

## 2010-09-17 DIAGNOSIS — R7309 Other abnormal glucose: Secondary | ICD-10-CM

## 2010-09-17 LAB — HEPATIC FUNCTION PANEL
ALT: 18 U/L (ref 0–53)
AST: 22 U/L (ref 0–37)
Bilirubin, Direct: 0.1 mg/dL (ref 0.0–0.3)

## 2010-09-17 LAB — BASIC METABOLIC PANEL
Chloride: 100 mEq/L (ref 96–112)
Creat: 1.18 mg/dL (ref 0.50–1.35)
Potassium: 4.6 mEq/L (ref 3.5–5.3)
Sodium: 138 mEq/L (ref 135–145)

## 2010-09-17 LAB — LIPID PANEL
Cholesterol: 142 mg/dL (ref 0–200)
HDL: 35 mg/dL — ABNORMAL LOW (ref >39)
Total CHOL/HDL Ratio: 4.1 Ratio
Triglycerides: 166 mg/dL — ABNORMAL HIGH (ref <150)

## 2010-09-17 MED ORDER — LISINOPRIL 20 MG PO TABS: 20.0000 mg | ORAL_TABLET | Freq: Every day | ORAL | Status: DC

## 2010-09-17 MED ORDER — COLCHICINE 0.6 MG PO TABS: 0.6000 mg | ORAL_TABLET | Freq: Two times a day (BID) | ORAL | Status: DC | PRN

## 2010-09-17 MED ORDER — ATORVASTATIN CALCIUM 10 MG PO TABS: 10.0000 mg | ORAL_TABLET | Freq: Every day | ORAL | Status: DC

## 2010-09-17 MED ORDER — CHLORTHALIDONE 25 MG PO TABS: 25.0000 mg | ORAL_TABLET | Freq: Every day | ORAL | Status: DC

## 2010-09-17 MED ORDER — FLUTICASONE PROPIONATE 50 MCG/ACT NA SUSP: 2.0000 | Freq: Every day | NASAL | Status: DC

## 2010-09-17 NOTE — Assessment & Plan Note (Signed)
Obtain lipid/lft. 

## 2010-09-17 NOTE — Telephone Encounter (Signed)
chem7 v58.69, cbc 401.9, lipid/lft 272.4

## 2010-09-17 NOTE — Patient Instructions (Signed)
Please schedule chem7 v58.69, cbc 401.9, lipid/lft 272.4 prior to next visit

## 2010-09-17 NOTE — Assessment & Plan Note (Signed)
Obtain chem7, a1c. Consider dc of metformin. Diabetic diet, exercise and wt loss

## 2010-09-17 NOTE — Assessment & Plan Note (Signed)
Normotensive and stable. Continue current regimen. Monitor bp as outpt and followup in clinic as scheduled. Consider dc of chlorthalidone if continues with gout attacks

## 2010-09-17 NOTE — Progress Notes (Signed)
  Subjective:    Patient ID: Barry Anthony, male    DOB: January 02, 1961, 50 y.o.   MRN: [id number]  HPI Pt presents to clinic for followup of multiple medical problems. BP reviewed nl. Excellent glycemic control. Only taking metformin qd. Continues to try and lose wt. Has had recent gout attack in feet. No improvement with indocin. Tolerates statin tx without myalgias or abn lft. No other complaints.  Past Medical History  Diagnosis Date  . Allergic rhinitis   . GERD (gastroesophageal reflux disease)   . Hyperlipidemia   . Hypertension   . History of chicken pox    Past Surgical History  Procedure Date  . Knee surgery 1981    right knee  . Tonsillectomy 1969    reports that he has never smoked. He has never used smokeless tobacco. He reports that he drinks alcohol. His drug history not on file. family history includes Coronary artery disease in his father; Hyperlipidemia in his father; Hypertension in his father; Other in an unspecified family member; and Thyroid cancer in his father. No Known Allergies   Review of Systems see hpi    Objective:   Physical Exam  Physical Exam  Nursing note and vitals reviewed. Constitutional: Appears well-developed and well-nourished. No distress.  HENT:  Head: Normocephalic and atraumatic.  Right Ear: External ear normal.  Left Ear: External ear normal.  Eyes: Conjunctivae are normal. No scleral icterus.  Neck: Neck supple. Cardiovascular: Normal rate, regular rhythm and normal heart sounds.  Exam reveals no gallop and no friction rub.   No murmur heard. Pulmonary/Chest: Effort normal and breath sounds normal. No respiratory distress. He has no wheezes. no rales.  Lymphadenopathy:    He has no cervical adenopathy.  Neurological:Alert.  Skin: Skin is warm and dry. Not diaphoretic.  Psychiatric: Has a normal mood and affect.         Assessment & Plan:

## 2010-09-17 NOTE — Telephone Encounter (Signed)
Patient is coming for 6 month follow up week of 3-13  Please fax lab order for week prior

## 2010-09-17 NOTE — Assessment & Plan Note (Signed)
Failed indocin. Attempt colchicine bid prn. Consider dc of chlorthalidone.

## 2010-09-18 LAB — HEMOGLOBIN A1C: Mean Plasma Glucose: 126 mg/dL — ABNORMAL HIGH (ref <117)

## 2010-09-18 NOTE — Telephone Encounter (Signed)
Labs entered for the first week of March 2013

## 2011-03-18 ENCOUNTER — Ambulatory Visit: Payer: BC Managed Care – PPO | Admitting: Internal Medicine

## 2011-03-28 ENCOUNTER — Other Ambulatory Visit: Payer: Self-pay | Admitting: *Deleted

## 2011-03-28 ENCOUNTER — Other Ambulatory Visit: Payer: Self-pay | Admitting: Internal Medicine

## 2011-03-28 DIAGNOSIS — Z79899 Other long term (current) drug therapy: Secondary | ICD-10-CM

## 2011-03-28 DIAGNOSIS — I10 Essential (primary) hypertension: Secondary | ICD-10-CM

## 2011-03-28 DIAGNOSIS — E785 Hyperlipidemia, unspecified: Secondary | ICD-10-CM

## 2011-03-28 LAB — BASIC METABOLIC PANEL
BUN: 16 mg/dL (ref 6–23)
Calcium: 9.3 mg/dL (ref 8.4–10.5)
Creat: 1.24 mg/dL (ref 0.50–1.35)
Glucose, Bld: 101 mg/dL — ABNORMAL HIGH (ref 70–99)
Potassium: 4.4 mEq/L (ref 3.5–5.3)

## 2011-03-28 LAB — HEPATIC FUNCTION PANEL
ALT: 34 U/L (ref 0–53)
AST: 31 U/L (ref 0–37)
Albumin: 4.5 g/dL (ref 3.5–5.2)
Alkaline Phosphatase: 49 U/L (ref 39–117)
Total Bilirubin: 0.9 mg/dL (ref 0.3–1.2)

## 2011-03-28 LAB — LIPID PANEL
Cholesterol: 146 mg/dL (ref 0–200)
HDL: 35 mg/dL — ABNORMAL LOW (ref >39)

## 2011-03-28 LAB — CBC
MCV: 86.2 fL (ref 78.0–100.0)
Platelets: 262 10*3/uL (ref 150–400)
RBC: 5.36 MIL/uL (ref 4.22–5.81)
RDW: 13.3 % (ref 11.5–15.5)
WBC: 7.8 10*3/uL (ref 4.0–10.5)

## 2011-04-01 ENCOUNTER — Ambulatory Visit (INDEPENDENT_AMBULATORY_CARE_PROVIDER_SITE_OTHER): Payer: BC Managed Care – PPO | Admitting: Internal Medicine

## 2011-04-01 ENCOUNTER — Telehealth: Payer: Self-pay | Admitting: Internal Medicine

## 2011-04-01 ENCOUNTER — Encounter: Payer: Self-pay | Admitting: Internal Medicine

## 2011-04-01 VITALS — BP 118/68 | HR 83 | Temp 98.0°F | Resp 20 | Wt 308.0 lb

## 2011-04-01 DIAGNOSIS — R7309 Other abnormal glucose: Secondary | ICD-10-CM

## 2011-04-01 DIAGNOSIS — E785 Hyperlipidemia, unspecified: Secondary | ICD-10-CM

## 2011-04-01 DIAGNOSIS — I10 Essential (primary) hypertension: Secondary | ICD-10-CM

## 2011-04-01 MED ORDER — LISINOPRIL 20 MG PO TABS: 20.0000 mg | ORAL_TABLET | Freq: Every day | ORAL | Status: DC

## 2011-04-01 MED ORDER — ATORVASTATIN CALCIUM 10 MG PO TABS: 10.0000 mg | ORAL_TABLET | Freq: Every day | ORAL | Status: DC

## 2011-04-01 MED ORDER — CHLORTHALIDONE 25 MG PO TABS: 25.0000 mg | ORAL_TABLET | Freq: Every day | ORAL | Status: DC

## 2011-04-01 NOTE — Assessment & Plan Note (Signed)
hdl remains low. ldl at goal. Continue statin tx at current dose. Increase regular aerobic exercise.

## 2011-04-01 NOTE — Assessment & Plan Note (Signed)
Improved control despite dc of metformin. Encouraged efforts at exercise and wt loss. Obtain chem7 and a1c prior to next appt

## 2011-04-01 NOTE — Progress Notes (Signed)
  Subjective:    Patient ID: Barry Anthony, male    DOB: [birth year], 51 y.o.   MRN: [id number]  HPI Pt presents to clinic for followup of multiple medical problems. Dc'ed metformin after last visit and has been exercising regularly. A1c reviewed improved 5.9. BP reviewed normotensive. No active complaint.   Past Medical History  Diagnosis Date  . Allergic rhinitis   . GERD (gastroesophageal reflux disease)   . Hyperlipidemia   . Hypertension   . History of chicken pox    Past Surgical History  Procedure Date  . Knee surgery 1981    right knee  . Tonsillectomy 1969    reports that he has never smoked. He has never used smokeless tobacco. He reports that he drinks alcohol. His drug history not on file. family history includes Coronary artery disease in his father; Hyperlipidemia in his father; Hypertension in his father; Other in an unspecified family member; and Thyroid cancer in his father. No Known Allergies    Review of Systems see hpi     Objective:   Physical Exam  Physical Exam  Nursing note and vitals reviewed. Constitutional: Appears well-developed and well-nourished. No distress.  HENT:  Head: Normocephalic and atraumatic.  Right Ear: External ear normal.  Left Ear: External ear normal.  Eyes: Conjunctivae are normal. No scleral icterus.  Neck: Neck supple. Carotid bruit is not present.  Cardiovascular: Normal rate, regular rhythm and normal heart sounds.  Exam reveals no gallop and no friction rub.   No murmur heard. Pulmonary/Chest: Effort normal and breath sounds normal. No respiratory distress. He has no wheezes. no rales.  Lymphadenopathy:    He has no cervical adenopathy.  Neurological:Alert.  Skin: Skin is warm and dry. Not diaphoretic.  Psychiatric: Has a normal mood and affect.        Assessment & Plan:

## 2011-04-01 NOTE — Assessment & Plan Note (Signed)
Good control. Dc chlorthalidone. Monitor bp as outpt and f/u in clinic as scheduled.

## 2011-04-01 NOTE — Telephone Encounter (Signed)
Lab orders entered for August 2013. 

## 2011-04-01 NOTE — Patient Instructions (Signed)
Please schedule fasting labs prior to next visit Chem7, a1c-hyperglycemia and lipid/lft-272.4 

## 2011-09-01 ENCOUNTER — Ambulatory Visit: Payer: BC Managed Care – PPO | Admitting: Internal Medicine

## 2011-09-11 LAB — HEPATIC FUNCTION PANEL
ALT: 33 U/L (ref 0–53)
AST: 24 U/L (ref 0–37)
Alkaline Phosphatase: 57 U/L (ref 39–117)
Bilirubin, Direct: 0.2 mg/dL (ref 0.0–0.3)
Total Bilirubin: 0.8 mg/dL (ref 0.3–1.2)

## 2011-09-11 LAB — LIPID PANEL
Cholesterol: 125 mg/dL (ref 0–200)
HDL: 31 mg/dL — ABNORMAL LOW (ref >39)

## 2011-09-11 LAB — BASIC METABOLIC PANEL
BUN: 14 mg/dL (ref 6–23)
Creat: 1.19 mg/dL (ref 0.50–1.35)
Potassium: 4.5 mEq/L (ref 3.5–5.3)

## 2011-09-11 NOTE — Telephone Encounter (Signed)
Pt presented to the lab, future orders released. 

## 2011-09-11 NOTE — Addendum Note (Signed)
Addended by: Mervin Kung A on: 09/11/2011 08:15 AM   Modules accepted: Orders

## 2011-09-12 LAB — HEMOGLOBIN A1C
Hgb A1c MFr Bld: 5.8 % — ABNORMAL HIGH (ref <5.7)
Mean Plasma Glucose: 120 mg/dL — ABNORMAL HIGH (ref <117)

## 2011-09-16 ENCOUNTER — Ambulatory Visit (INDEPENDENT_AMBULATORY_CARE_PROVIDER_SITE_OTHER): Payer: BC Managed Care – PPO | Admitting: Internal Medicine

## 2011-09-16 ENCOUNTER — Telehealth: Payer: Self-pay | Admitting: Internal Medicine

## 2011-09-16 ENCOUNTER — Encounter: Payer: Self-pay | Admitting: Internal Medicine

## 2011-09-16 VITALS — BP 146/84 | HR 91 | Temp 98.0°F | Resp 18 | Ht 71.0 in | Wt 316.0 lb

## 2011-09-16 DIAGNOSIS — Z23 Encounter for immunization: Secondary | ICD-10-CM

## 2011-09-16 DIAGNOSIS — M25519 Pain in unspecified shoulder: Secondary | ICD-10-CM

## 2011-09-16 DIAGNOSIS — I10 Essential (primary) hypertension: Secondary | ICD-10-CM

## 2011-09-16 DIAGNOSIS — M25569 Pain in unspecified knee: Secondary | ICD-10-CM

## 2011-09-16 MED ORDER — DICLOFENAC SODIUM 75 MG PO TBEC: DELAYED_RELEASE_TABLET | ORAL | Status: AC

## 2011-09-16 NOTE — Patient Instructions (Signed)
Please schedule fasting labs prior to next visit Chem7, a1c-hyperglycemia. Lipid-272.4

## 2011-09-16 NOTE — Progress Notes (Signed)
  Subjective:    Patient ID: Barry Anthony, male    DOB: 04-04-1960, 51 y.o.   MRN: [id number]  HPI Pt presents to clinic for followup of multiple medical problems. Blood pressure mildly elevated. Off hydrochlorothiazide. Asymptomatic. Has had previous right knee surgery and notes intermittent swelling since April. States the knee feels loose and unstable. There's been no buckling or fall. Also notes right shoulder popping intermittently without injury or trauma.  Past Medical History  Diagnosis Date  . Allergic rhinitis   . GERD (gastroesophageal reflux disease)   . Hyperlipidemia   . Hypertension   . History of chicken pox    Past Surgical History  Procedure Date  . Knee surgery 1981    right knee  . Tonsillectomy 1969    reports that he has never smoked. He has never used smokeless tobacco. He reports that he drinks alcohol. His drug history not on file. family history includes Coronary artery disease in his father; Hyperlipidemia in his father; Hypertension in his father; Other in an unspecified family member; and Thyroid cancer in his father. No Known Allergies    Review of Systems see hpi     Objective:   Physical Exam  Nursing note and vitals reviewed. Constitutional: He appears well-developed and well-nourished. No distress.  Musculoskeletal:       Right shoulder-no crepitus. Full range of motion. No erythema warmth or effusion. Nontender. Right knee-no erythema warmth or effusion. Full range of motion. Nontender. Able weight-bear and ambulate  Skin: He is not diaphoretic.          Assessment & Plan:

## 2011-09-16 NOTE — Telephone Encounter (Signed)
Please schedule fasting labs prior to next visit  Chem7, a1c-hyperglycemia. Lipid-272.4    Patient has upcoming appointment on 02/03/12. He will be going to [facility] lab.

## 2011-09-20 DIAGNOSIS — M25569 Pain in unspecified knee: Secondary | ICD-10-CM | POA: Insufficient documentation

## 2011-09-20 DIAGNOSIS — M25519 Pain in unspecified shoulder: Secondary | ICD-10-CM | POA: Insufficient documentation

## 2011-09-20 NOTE — Assessment & Plan Note (Signed)
Voltaren as above. Followup if no improvement or worsening.

## 2011-09-20 NOTE — Assessment & Plan Note (Signed)
Obtain plain x-ray of right knee. Attempt Voltaren. Consider orthopedic referral if symptoms persist

## 2011-09-20 NOTE — Assessment & Plan Note (Addendum)
Isolated elevation. Monitor blood pressure as an outpatient and report values. Recommend regular exercise and weight loss

## 2011-09-29 ENCOUNTER — Ambulatory Visit (HOSPITAL_BASED_OUTPATIENT_CLINIC_OR_DEPARTMENT_OTHER)
Admission: RE | Admit: 2011-09-29 | Discharge: 2011-09-29 | Disposition: A | Discharge status: Home / Self Care | Payer: BC Managed Care – PPO | Source: Ambulatory Visit | Attending: Internal Medicine | Admitting: Internal Medicine

## 2011-09-29 DIAGNOSIS — M25519 Pain in unspecified shoulder: Secondary | ICD-10-CM | POA: Insufficient documentation

## 2011-09-29 DIAGNOSIS — M25569 Pain in unspecified knee: Secondary | ICD-10-CM

## 2011-12-01 ENCOUNTER — Telehealth: Payer: Self-pay | Admitting: Internal Medicine

## 2011-12-01 MED ORDER — LISINOPRIL 20 MG PO TABS: 20.0000 mg | ORAL_TABLET | Freq: Every day | ORAL | Status: DC

## 2011-12-01 MED ORDER — ATORVASTATIN CALCIUM 10 MG PO TABS: 10.0000 mg | ORAL_TABLET | Freq: Every day | ORAL | Status: DC

## 2011-12-01 NOTE — Telephone Encounter (Signed)
Rx[s] to pharmacy/[facility] 

## 2011-12-01 NOTE — Telephone Encounter (Signed)
Refill- atorvastatin 10mg  tablets. Take one tablet by mouth every day. Qty 30 last fill 10.25.13  Refill- lisinopril 20mg  tablets. Take one tablet by mouth every day. Qty 30 last fill 10.25.13

## 2012-01-13 ENCOUNTER — Encounter: Payer: BC Managed Care – PPO | Admitting: Internal Medicine

## 2012-01-15 ENCOUNTER — Encounter: Payer: Self-pay | Admitting: Internal Medicine

## 2012-01-15 ENCOUNTER — Ambulatory Visit (INDEPENDENT_AMBULATORY_CARE_PROVIDER_SITE_OTHER): Payer: BC Managed Care – PPO | Admitting: Internal Medicine

## 2012-01-15 VITALS — BP 154/92 | HR 78 | Temp 98.2°F | Resp 18 | Ht 70.0 in | Wt 314.2 lb

## 2012-01-15 DIAGNOSIS — Z0000 Encounter for general adult medical examination without abnormal findings: Secondary | ICD-10-CM

## 2012-01-15 DIAGNOSIS — I10 Essential (primary) hypertension: Secondary | ICD-10-CM

## 2012-01-15 LAB — CBC WITH DIFFERENTIAL/PLATELET
Basophils Absolute: 0 10*3/uL (ref 0.0–0.1)
Basophils Relative: 0 % (ref 0–1)
Eosinophils Absolute: 0.1 10*3/uL (ref 0.0–0.7)
HCT: 46 % (ref 39.0–52.0)
Hemoglobin: 15.9 g/dL (ref 13.0–17.0)
MCH: 29.2 pg (ref 26.0–34.0)
MCHC: 34.6 g/dL (ref 30.0–36.0)
Monocytes Absolute: 0.5 10*3/uL (ref 0.1–1.0)
Monocytes Relative: 7 % (ref 3–12)
Neutro Abs: 4.7 10*3/uL (ref 1.7–7.7)
RDW: 13.6 % (ref 11.5–15.5)

## 2012-01-15 LAB — HEPATIC FUNCTION PANEL
ALT: 33 U/L (ref 0–53)
Alkaline Phosphatase: 62 U/L (ref 39–117)
Indirect Bilirubin: 0.5 mg/dL (ref 0.0–0.9)
Total Protein: 7.1 g/dL (ref 6.0–8.3)

## 2012-01-15 LAB — BASIC METABOLIC PANEL
BUN: 13 mg/dL (ref 6–23)
Creat: 1.19 mg/dL (ref 0.50–1.35)
Potassium: 4.8 mEq/L (ref 3.5–5.3)

## 2012-01-15 LAB — LIPID PANEL
Cholesterol: 160 mg/dL (ref 0–200)
Triglycerides: 192 mg/dL — ABNORMAL HIGH (ref <150)
VLDL: 38 mg/dL (ref 0–40)

## 2012-01-15 MED ORDER — ALBUTEROL SULFATE HFA 108 (90 BASE) MCG/ACT IN AERS: 2.0000 | INHALATION_SPRAY | Freq: Every day | RESPIRATORY_TRACT | Status: DC | PRN

## 2012-01-15 MED ORDER — LISINOPRIL 40 MG PO TABS: 40.0000 mg | ORAL_TABLET | Freq: Every day | ORAL | Status: DC

## 2012-01-16 LAB — URINALYSIS, ROUTINE W REFLEX MICROSCOPIC
Glucose, UA: NEGATIVE mg/dL
Hgb urine dipstick: NEGATIVE
Ketones, ur: NEGATIVE mg/dL
pH: 6.5 (ref 5.0–8.0)

## 2012-01-18 DIAGNOSIS — Z0000 Encounter for general adult medical examination without abnormal findings: Secondary | ICD-10-CM | POA: Insufficient documentation

## 2012-01-18 NOTE — Assessment & Plan Note (Signed)
Nl exam. Obtain cpe labs. ekg obtained demonstrating NSR 70 with nl intervals and axis.

## 2012-01-18 NOTE — Assessment & Plan Note (Signed)
suboptimal control. Increase lisinopril dose. Monitor bp as outpt and f/u in clinic as scheduled.

## 2012-01-18 NOTE — Progress Notes (Signed)
  Subjective:    Patient ID: Barry Anthony, male    DOB: 04/11/1960, 52 y.o.   MRN: [id number]  HPI Pt presents to clinic for annual exam. BP elevated without sx's. No active complaints.  Past Medical History  Diagnosis Date  . Allergic rhinitis   . GERD (gastroesophageal reflux disease)   . Hyperlipidemia   . Hypertension   . History of chicken pox    Past Surgical History  Procedure Date  . Knee surgery 1981    right knee  . Tonsillectomy 1969    reports that he has never smoked. He has never used smokeless tobacco. He reports that he drinks alcohol. His drug history not on file. family history includes Coronary artery disease in his father; Hyperlipidemia in his father; Hypertension in his father; Other in an unspecified family member; and Thyroid cancer in his father. No Known Allergies   Review of Systems see hpi     Objective:   Physical Exam  Physical Exam  Nursing note and vitals reviewed. Constitutional: He appears well-developed and well-nourished. No distress.  HENT:  Head: Normocephalic and atraumatic.  Right Ear: Tympanic membrane and external ear normal.  Left Ear: Tympanic membrane and external ear normal.  Nose: Nose normal.  Mouth/Throat: Uvula is midline, oropharynx is clear and moist and mucous membranes are normal. No oropharyngeal exudate.  Eyes: Conjunctivae and EOM are normal. Pupils are equal, round, and reactive to light. Right eye exhibits no discharge. Left eye exhibits no discharge. No scleral icterus.  Neck: Neck supple. Carotid bruit is not present. No thyromegaly present.  Cardiovascular: Normal rate, regular rhythm and normal heart sounds.  Exam reveals no gallop and no friction rub.   No murmur heard. Pulmonary/Chest: Effort normal and breath sounds normal. No respiratory distress. He has no wheezes. He has no rales.  Abdominal: Soft. He exhibits no distension and no mass. There is no hepatosplenomegaly. There is no tenderness. There is no  rebound. Hernia confirmed negative in the right inguinal area and confirmed negative in the left inguinal area.  Lymphadenopathy:    He has no cervical adenopathy.  Neurological: He is alert.  Skin: Skin is warm and dry. He is not diaphoretic.  Psychiatric: He has a normal mood and affect.        Assessment & Plan:

## 2012-02-03 ENCOUNTER — Ambulatory Visit: Payer: BC Managed Care – PPO | Admitting: Internal Medicine

## 2012-03-15 ENCOUNTER — Ambulatory Visit (INDEPENDENT_AMBULATORY_CARE_PROVIDER_SITE_OTHER): Payer: BC Managed Care – PPO | Admitting: Family Medicine

## 2012-03-15 ENCOUNTER — Encounter: Payer: Self-pay | Admitting: Family Medicine

## 2012-03-15 VITALS — BP 150/90 | HR 76 | Temp 97.7°F | Ht 71.0 in | Wt 312.1 lb

## 2012-03-15 DIAGNOSIS — I10 Essential (primary) hypertension: Secondary | ICD-10-CM

## 2012-03-15 DIAGNOSIS — R739 Hyperglycemia, unspecified: Secondary | ICD-10-CM

## 2012-03-15 DIAGNOSIS — R7309 Other abnormal glucose: Secondary | ICD-10-CM

## 2012-03-15 DIAGNOSIS — R252 Cramp and spasm: Secondary | ICD-10-CM

## 2012-03-15 DIAGNOSIS — E785 Hyperlipidemia, unspecified: Secondary | ICD-10-CM

## 2012-03-15 LAB — RENAL FUNCTION PANEL
Chloride: 99 mEq/L (ref 96–112)
Creat: 1.05 mg/dL (ref 0.50–1.35)
Phosphorus: 3.3 mg/dL (ref 2.3–4.6)
Potassium: 4.5 mEq/L (ref 3.5–5.3)
Sodium: 142 mEq/L (ref 135–145)

## 2012-03-15 LAB — HEMOGLOBIN A1C
Hgb A1c MFr Bld: 5.9 % — ABNORMAL HIGH (ref <5.7)
Mean Plasma Glucose: 123 mg/dL — ABNORMAL HIGH (ref <117)

## 2012-03-15 MED ORDER — LISINOPRIL 40 MG PO TABS: 40.0000 mg | ORAL_TABLET | Freq: Every day | ORAL | Status: DC

## 2012-03-15 MED ORDER — METOPROLOL SUCCINATE ER 25 MG PO TB24: 25.0000 mg | ORAL_TABLET | Freq: Every day | ORAL | Status: DC

## 2012-03-15 MED ORDER — ATORVASTATIN CALCIUM 10 MG PO TABS: 10.0000 mg | ORAL_TABLET | Freq: Every day | ORAL | Status: DC

## 2012-03-15 NOTE — Patient Instructions (Addendum)
MegaRed caps daily by Schiff Aspercreme as needed  Cholesterol Cholesterol is a white, waxy, fat-like protein needed by your body in small amounts. The liver makes all the cholesterol you need. It is carried from the liver by the blood through the blood vessels. Deposits (plaque) may build up on blood vessel walls. This makes the arteries narrower and stiffer. Plaque increases the risk for heart attack and stroke. You cannot feel your cholesterol level even if it is very high. The only way to know is by a blood test to check your lipid (fats) levels. Once you know your cholesterol levels, you should keep a record of the test results. Work with your caregiver to to keep your levels in the desired range. WHAT THE RESULTS MEAN:  Total cholesterol is a rough measure of all the cholesterol in your blood.  LDL is the so-called bad cholesterol. This is the type that deposits cholesterol in the walls of the arteries. You want this level to be low.  HDL is the good cholesterol because it cleans the arteries and carries the LDL away. You want this level to be high.  Triglycerides are fat that the body can either burn for energy or store. High levels are closely linked to heart disease. DESIRED LEVELS:  Total cholesterol below 200.  LDL below 100 for people at risk, below 70 for very high risk.  HDL above 50 is good, above 60 is best.  Triglycerides below 150. HOW TO LOWER YOUR CHOLESTEROL:  Diet.  Choose fish or white meat chicken and [place], roasted or baked. Limit fatty cuts of red meat, fried foods, and processed meats, such as sausage and lunch meat.  Eat lots of fresh fruits and vegetables. Choose whole grains, beans, pasta, potatoes and cereals.  Use only small amounts of olive, corn or canola oils. Avoid butter, mayonnaise, shortening or palm kernel oils. Avoid foods with trans-fats.  Use skim/nonfat milk and low-fat/nonfat yogurt and cheeses. Avoid whole milk, cream, ice cream, egg  yolks and cheeses. Healthy desserts include angel food cake, ginger snaps, animal crackers, hard candy, popsicles, and low-fat/nonfat frozen yogurt. Avoid pastries, cakes, pies and cookies.  Exercise.  A regular program helps decrease LDL and raises HDL.  Helps with weight control.  Do things that increase your activity level like gardening, walking, or taking the stairs.  Medication.  May be prescribed by your caregiver to help lowering cholesterol and the risk for heart disease.  You may need medicine even if your levels are normal if you have several risk factors. HOME CARE INSTRUCTIONS   Follow your diet and exercise programs as suggested by your caregiver.  Take medications as directed.  Have blood work done when your caregiver feels it is necessary. MAKE SURE YOU:   Understand these instructions.  Will watch your condition.  Will get help right away if you are not doing well or get worse. Document Released: 09/17/2000 Document Revised: 03/17/2011 Document Reviewed: 03/10/2007 [facility] Patient Information 2013 [place], [place].

## 2012-03-16 NOTE — Progress Notes (Signed)
Quick Note:  Patient Informed and voiced understanding ______ 

## 2012-03-18 ENCOUNTER — Encounter: Payer: Self-pay | Admitting: Family Medicine

## 2012-03-18 NOTE — Assessment & Plan Note (Signed)
Well controlled, minimize simple carbs 

## 2012-03-18 NOTE — Assessment & Plan Note (Signed)
Tolerating Atorvastatin encouraged to try adding MegaRed krill oil caps and avoid trans fats.

## 2012-03-18 NOTE — Assessment & Plan Note (Signed)
Poor control will add Metoprolol XR 25 mg daily and reassess at next visit.

## 2012-03-18 NOTE — Progress Notes (Signed)
Patient ID: Barry Anthony, male   DOB: 09-28-1960, 52 y.o.   MRN: [id number] Barry Anthony [id number] Sep 23, 1960 03/18/2012      Progress Note-Follow Up  Subjective  Chief Complaint  Chief Complaint  Patient presents with  . Follow-up    2 month    HPI  Patient is a 52 year old Caucasian male who is in today for followup. He is frustrated with his blood pressure control. Believes it's been up. Says he gets an occasional throbbing headache and feels flushed. Denies chest pain or palpitations. No other recent illness. No GI or GU complaints. No shortness of breath or congestion. Is not taking any significant over-the-counter medications. Patient previously took chlorthalidone but did not tolerate it due to his gout. No polyuria or polydipsia and is trying to minimize his carbohydrate intake  Past Medical History  Diagnosis Date  . Allergic rhinitis   . GERD (gastroesophageal reflux disease)   . Hyperlipidemia   . Hypertension   . History of chicken pox     Past Surgical History  Procedure Laterality Date  . Knee surgery  1981    right knee  . Tonsillectomy  1969    Family History  Problem Relation Age of Onset  . Thyroid cancer Father   . Hypertension Father   . Coronary artery disease Father   . Hyperlipidemia Father   . Other      no colon cancer, no prostate canceer    History   Social History  . Marital Status: Married    Spouse Name: N/A    Number of Children: N/A  . Years of Education: N/A   Occupational History  . Not on file.   Social History Main Topics  . Smoking status: Never Smoker   . Smokeless tobacco: Never Used  . Alcohol Use: Yes     Comment: 2 drinks / week  . Drug Use: Not on file  . Sexually Active: Not on file   Other Topics Concern  . Not on file   Social History Narrative   Occupation:  Parts mgr   Married 16 yrs    2 children  10, 12    Never Smoked    Alcohol use-yes (social)     Current Outpatient Prescriptions on File  Prior to Visit  Medication Sig Dispense Refill  . albuterol (PROAIR HFA) 108 (90 BASE) MCG/ACT inhaler Inhale 2 puffs into the lungs daily as needed.  1 Inhaler  6  . fluticasone (FLONASE) 50 MCG/ACT nasal spray Place 2 sprays into the nose daily.  16 g  11  . colchicine 0.6 MG tablet Take 1 tablet (0.6 mg total) by mouth 2 (two) times daily as needed.  60 tablet  3  . [DISCONTINUED] metFORMIN (GLUCOPHAGE) 500 MG tablet Take 500 mg by mouth 1 day or 1 dose.        No current facility-administered medications on file prior to visit.    No Known Allergies  Review of Systems  Review of Systems  Constitutional: Negative for fever and malaise/fatigue.  HENT: Negative for congestion.   Eyes: Negative for discharge.  Respiratory: Negative for shortness of breath.   Cardiovascular: Negative for chest pain, palpitations and leg swelling.  Gastrointestinal: Negative for nausea, abdominal pain and diarrhea.  Genitourinary: Negative for dysuria.  Musculoskeletal: Negative for falls.  Skin: Negative for rash.  Neurological: Positive for headaches. Negative for loss of consciousness.  Endo/Heme/Allergies: Negative for polydipsia.  Psychiatric/Behavioral: Negative for depression and suicidal ideas.  The patient is not nervous/anxious and does not have insomnia.     Objective  BP 150/90  Pulse 76  Temp(Src) 97.7 F (36.5 C) (Oral)  Ht 5\' 11"  (1.803 m)  Wt 312 lb 1.3 oz (141.559 kg)  BMI 43.55 kg/m2  SpO2 97%  Physical Exam  Physical Exam  Constitutional: He is oriented to person, place, and time and well-developed, well-nourished, and in no distress. No distress.  HENT:  Head: Normocephalic and atraumatic.  Eyes: Conjunctivae are normal.  Neck: Neck supple. No thyromegaly present.  Cardiovascular: Normal rate, regular rhythm and normal heart sounds.   No murmur heard. Pulmonary/Chest: Effort normal and breath sounds normal. No respiratory distress.  Abdominal: He exhibits no  distension and no mass. There is no tenderness.  Musculoskeletal: He exhibits no edema.  Neurological: He is alert and oriented to person, place, and time.  Skin: Skin is warm.  Psychiatric: Memory, affect and judgment normal.    Lab Results  Component Value Date   TSH 1.391 01/15/2012   Lab Results  Component Value Date   WBC 7.9 01/15/2012   HGB 15.9 01/15/2012   HCT 46.0 01/15/2012   MCV 84.6 01/15/2012   PLT 256 01/15/2012   Lab Results  Component Value Date   CREATININE 1.05 03/15/2012   BUN 15 03/15/2012   NA 142 03/15/2012   K 4.5 03/15/2012   CL 99 03/15/2012   CO2 26 03/15/2012   Lab Results  Component Value Date   ALT 33 01/15/2012   AST 26 01/15/2012   ALKPHOS 62 01/15/2012   BILITOT 0.7 01/15/2012   Lab Results  Component Value Date   CHOL 160 01/15/2012   Lab Results  Component Value Date   HDL 38* 01/15/2012   Lab Results  Component Value Date   LDLCALC 84 01/15/2012   Lab Results  Component Value Date   TRIG 192* 01/15/2012   Lab Results  Component Value Date   CHOLHDL 4.2 01/15/2012     Assessment & Plan  HYPERTENSION Poor control will add Metoprolol XR 25 mg daily and reassess at next visit.  HYPERLIPIDEMIA Tolerating Atorvastatin encouraged to try adding MegaRed krill oil caps and avoid trans fats.  DIABETES MELLITUS, TYPE II, BORDERLINE Well controlled, minimize simple carbs

## 2012-05-17 ENCOUNTER — Ambulatory Visit: Payer: BC Managed Care – PPO | Admitting: Family Medicine

## 2012-06-02 ENCOUNTER — Ambulatory Visit: Payer: BC Managed Care – PPO | Admitting: Family Medicine

## 2012-06-07 ENCOUNTER — Encounter: Payer: Self-pay | Admitting: Family Medicine

## 2012-06-07 ENCOUNTER — Ambulatory Visit (INDEPENDENT_AMBULATORY_CARE_PROVIDER_SITE_OTHER): Payer: BC Managed Care – PPO | Admitting: Family Medicine

## 2012-06-07 VITALS — BP 148/82 | HR 78 | Temp 98.3°F | Ht 71.0 in | Wt 315.1 lb

## 2012-06-07 DIAGNOSIS — E785 Hyperlipidemia, unspecified: Secondary | ICD-10-CM

## 2012-06-07 DIAGNOSIS — R7309 Other abnormal glucose: Secondary | ICD-10-CM

## 2012-06-07 DIAGNOSIS — I10 Essential (primary) hypertension: Secondary | ICD-10-CM

## 2012-06-07 MED ORDER — METOPROLOL SUCCINATE ER 50 MG PO TB24: 50.0000 mg | ORAL_TABLET | Freq: Every day | ORAL | Status: DC

## 2012-06-07 NOTE — Patient Instructions (Signed)
Labs prior hgba1c, tsh, lipid, renal, cbc, hepatic,  Hypertension As your heart beats, it forces blood through your arteries. This force is your blood pressure. If the pressure is too high, it is called hypertension (HTN) or high blood pressure. HTN is dangerous because you may have it and not know it. High blood pressure may mean that your heart has to work harder to pump blood. Your arteries may be narrow or stiff. The extra work puts you at risk for heart disease, stroke, and other problems.  Blood pressure consists of two numbers, a higher number over a lower, 110/72, for example. It is stated as "110 over 72." The ideal is below 120 for the top number (systolic) and under 80 for the bottom (diastolic). Write down your blood pressure today. You should pay close attention to your blood pressure if you have certain conditions such as:  Heart failure.  Prior heart attack.  Diabetes  Chronic kidney disease.  Prior stroke.  Multiple risk factors for heart disease. To see if you have HTN, your blood pressure should be measured while you are seated with your arm held at the level of the heart. It should be measured at least twice. A one-time elevated blood pressure reading (especially in the Emergency Department) does not mean that you need treatment. There may be conditions in which the blood pressure is different between your right and left arms. It is important to see your caregiver soon for a recheck. Most people have essential hypertension which means that there is not a specific cause. This type of high blood pressure may be lowered by changing lifestyle factors such as:  Stress.  Smoking.  Lack of exercise.  Excessive weight.  Drug/tobacco/alcohol use.  Eating less salt. Most people do not have symptoms from high blood pressure until it has caused damage to the body. Effective treatment can often prevent, delay or reduce that damage. TREATMENT  When a cause has been identified,  treatment for high blood pressure is directed at the cause. There are a large number of medications to treat HTN. These fall into several categories, and your caregiver will help you select the medicines that are best for you. Medications may have side effects. You should review side effects with your caregiver. If your blood pressure stays high after you have made lifestyle changes or started on medicines,   Your medication(s) may need to be changed.  Other problems may need to be addressed.  Be certain you understand your prescriptions, and know how and when to take your medicine.  Be sure to follow up with your caregiver within the time frame advised (usually within two weeks) to have your blood pressure rechecked and to review your medications.  If you are taking more than one medicine to lower your blood pressure, make sure you know how and at what times they should be taken. Taking two medicines at the same time can result in blood pressure that is too low. SEEK IMMEDIATE MEDICAL CARE IF:  You develop a severe headache, blurred or changing vision, or confusion.  You have unusual weakness or numbness, or a faint feeling.  You have severe chest or abdominal pain, vomiting, or breathing problems. MAKE SURE YOU:   Understand these instructions.  Will watch your condition.  Will get help right away if you are not doing well or get worse. Document Released: 12/23/2004 Document Revised: 03/17/2011 Document Reviewed: 08/13/2007 [facility] Patient Information 2014 [place], [place].

## 2012-06-08 NOTE — Progress Notes (Signed)
Patient ID: Barry Anthony, male   DOB: Dec 02, 1960, 52 y.o.   MRN: [id number] Barry Anthony [id number] Sep 26, 1960 06/08/2012      Progress Note-Follow Up  Subjective  Chief Complaint  Chief Complaint  Patient presents with  . Follow-up    on BP    HPI  Patient is a 52 year old male in today for followup. He reports feeling better since the addition of metoprolol. Says he said less headaches and less flushing. Denies worsening fatigue. No chest pain, palpitations, shortness of breath no GI or GU complaints. No fevers or recent illness.  Past Medical History  Diagnosis Date  . Allergic rhinitis   . GERD (gastroesophageal reflux disease)   . Hyperlipidemia   . Hypertension   . History of chicken pox     Past Surgical History  Procedure Laterality Date  . Knee surgery  1981    right knee  . Tonsillectomy  1969    Family History  Problem Relation Age of Onset  . Thyroid cancer Father   . Hypertension Father   . Coronary artery disease Father   . Hyperlipidemia Father   . Other      no colon cancer, no prostate canceer    History   Social History  . Marital Status: Married    Spouse Name: N/A    Number of Children: N/A  . Years of Education: N/A   Occupational History  . Not on file.   Social History Main Topics  . Smoking status: Never Smoker   . Smokeless tobacco: Never Used  . Alcohol Use: Yes     Comment: 2 drinks / week  . Drug Use: Not on file  . Sexually Active: Not on file   Other Topics Concern  . 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 albuterol (PROAIR HFA) 108 (90 BASE) MCG/ACT inhaler Inhale 2 puffs into the lungs daily as needed.  1 Inhaler  6  . atorvastatin (LIPITOR) 10 MG tablet Take 1 tablet (10 mg total) by mouth daily.  30 tablet  6   . fluticasone (FLONASE) 50 MCG/ACT nasal spray Place 2 sprays into the nose daily.  16 g  11  . lisinopril (PRINIVIL,ZESTRIL) 40 MG tablet Take 1 tablet (40 mg total) by mouth daily.  30 tablet  6  . colchicine 0.6 MG tablet Take 1 tablet (0.6 mg total) by mouth 2 (two) times daily as needed.  60 tablet  3  . [DISCONTINUED] metFORMIN (GLUCOPHAGE) 500 MG tablet Take 500 mg by mouth 1 day or 1 dose.        No current facility-administered medications on file prior to visit.    No Known Allergies  Review of Systems  Review of Systems  Constitutional: Negative for fever and malaise/fatigue.  HENT: Negative for congestion.   Eyes: Negative for discharge.  Respiratory: Negative for shortness of breath.   Cardiovascular: Negative for chest pain, palpitations and leg swelling.  Gastrointestinal: Negative for nausea, abdominal pain and diarrhea.  Genitourinary: Negative for dysuria.  Musculoskeletal: Negative for falls.  Skin: Negative for rash.  Neurological: Negative for loss of consciousness and headaches.  Endo/Heme/Allergies: Negative for polydipsia.  Psychiatric/Behavioral: Negative for depression and suicidal  ideas. The patient is not nervous/anxious and does not have insomnia.     Objective  BP 148/82  Pulse 78  Temp(Src) 98.3 F (36.8 C) (Oral)  Ht 5\' 11"  (1.803 m)  Wt 315 lb 1.3 oz (142.919 kg)  BMI 43.96 kg/m2  SpO2 96%  Physical Exam  Physical Exam  Constitutional: He is oriented to person, place, and time and well-developed, well-nourished, and in no distress. No distress.  HENT:  Head: Normocephalic and atraumatic.  Eyes: Conjunctivae are normal.  Neck: Neck supple. No thyromegaly present.  Cardiovascular: Normal rate, regular rhythm and normal heart sounds.   No murmur heard. Pulmonary/Chest: Effort normal and breath sounds normal. No respiratory distress.  Abdominal: He exhibits no distension and no mass. There is no tenderness.  Musculoskeletal: He exhibits  no edema.  Neurological: He is alert and oriented to person, place, and time.  Skin: Skin is warm.  Psychiatric: Memory, affect and judgment normal.    Lab Results  Component Value Date   TSH 1.391 01/15/2012   Lab Results  Component Value Date   WBC 7.9 01/15/2012   HGB 15.9 01/15/2012   HCT 46.0 01/15/2012   MCV 84.6 01/15/2012   PLT 256 01/15/2012   Lab Results  Component Value Date   CREATININE 1.05 03/15/2012   BUN 15 03/15/2012   NA 142 03/15/2012   K 4.5 03/15/2012   CL 99 03/15/2012   CO2 26 03/15/2012   Lab Results  Component Value Date   ALT 33 01/15/2012   AST 26 01/15/2012   ALKPHOS 62 01/15/2012   BILITOT 0.7 01/15/2012   Lab Results  Component Value Date   CHOL 160 01/15/2012   Lab Results  Component Value Date   HDL 38* 01/15/2012   Lab Results  Component Value Date   LDLCALC 84 01/15/2012   Lab Results  Component Value Date   TRIG 192* 01/15/2012   Lab Results  Component Value Date   CHOLHDL 4.2 01/15/2012     Assessment & Plan  HYPERTENSION Feeling better with less headaches, flushing with addition of Metoprolol but still not controlled, increase Metoprolol XR 50 mg po daily.   HYPERLIPIDEMIA Tolerating Atorvastatin add CoQ10 and avoid trans fats.  DIABETES MELLITUS, TYPE II, BORDERLINE Minimize simple carbs hgba1c is below 6.0

## 2012-06-08 NOTE — Assessment & Plan Note (Signed)
Feeling better with less headaches, flushing with addition of Metoprolol but still not controlled, increase Metoprolol XR 50 mg po daily.

## 2012-06-08 NOTE — Assessment & Plan Note (Signed)
Tolerating Atorvastatin add CoQ10 and avoid trans fats.

## 2012-06-08 NOTE — Assessment & Plan Note (Signed)
Minimize simple carbs hgba1c is below 6.0

## 2012-06-16 ENCOUNTER — Ambulatory Visit: Payer: BC Managed Care – PPO | Admitting: Family

## 2012-09-07 ENCOUNTER — Ambulatory Visit (INDEPENDENT_AMBULATORY_CARE_PROVIDER_SITE_OTHER): Payer: BC Managed Care – PPO | Admitting: Family Medicine

## 2012-09-07 ENCOUNTER — Encounter: Payer: Self-pay | Admitting: Family Medicine

## 2012-09-07 VITALS — BP 150/100 | HR 82 | Temp 97.9°F | Ht 71.0 in | Wt 327.1 lb

## 2012-09-07 DIAGNOSIS — M25561 Pain in right knee: Secondary | ICD-10-CM

## 2012-09-07 DIAGNOSIS — I10 Essential (primary) hypertension: Secondary | ICD-10-CM

## 2012-09-07 DIAGNOSIS — R739 Hyperglycemia, unspecified: Secondary | ICD-10-CM

## 2012-09-07 DIAGNOSIS — E785 Hyperlipidemia, unspecified: Secondary | ICD-10-CM

## 2012-09-07 DIAGNOSIS — E669 Obesity, unspecified: Secondary | ICD-10-CM

## 2012-09-07 DIAGNOSIS — M25569 Pain in unspecified knee: Secondary | ICD-10-CM

## 2012-09-07 DIAGNOSIS — R7309 Other abnormal glucose: Secondary | ICD-10-CM

## 2012-09-07 LAB — LIPID PANEL
Cholesterol: 144 mg/dL (ref 0–200)
LDL Cholesterol: 57 mg/dL (ref 0–99)
Triglycerides: 251 mg/dL — ABNORMAL HIGH (ref <150)

## 2012-09-07 LAB — RENAL FUNCTION PANEL
CO2: 28 mEq/L (ref 19–32)
Chloride: 102 mEq/L (ref 96–112)
Creat: 1.26 mg/dL (ref 0.50–1.35)
Glucose, Bld: 97 mg/dL (ref 70–99)

## 2012-09-07 LAB — HEPATIC FUNCTION PANEL
ALT: 27 U/L (ref 0–53)
AST: 26 U/L (ref 0–37)
Albumin: 4.6 g/dL (ref 3.5–5.2)
Alkaline Phosphatase: 56 U/L (ref 39–117)
Total Bilirubin: 0.7 mg/dL (ref 0.3–1.2)

## 2012-09-07 LAB — CBC
HCT: 46.1 % (ref 39.0–52.0)
MCV: 84.9 fL (ref 78.0–100.0)
RBC: 5.43 MIL/uL (ref 4.22–5.81)
RDW: 13.7 % (ref 11.5–15.5)
WBC: 7.9 10*3/uL (ref 4.0–10.5)

## 2012-09-07 MED ORDER — ATORVASTATIN CALCIUM 10 MG PO TABS: 10.0000 mg | ORAL_TABLET | Freq: Every day | ORAL | Status: DC

## 2012-09-07 MED ORDER — FLUTICASONE PROPIONATE 50 MCG/ACT NA SUSP: 2.0000 | Freq: Every day | NASAL | Status: DC

## 2012-09-07 MED ORDER — METOPROLOL SUCCINATE ER 50 MG PO TB24: 50.0000 mg | ORAL_TABLET | Freq: Every day | ORAL | Status: DC

## 2012-09-07 MED ORDER — LISINOPRIL 40 MG PO TABS: 40.0000 mg | ORAL_TABLET | Freq: Every day | ORAL | Status: DC

## 2012-09-07 NOTE — Assessment & Plan Note (Signed)
Well controlled on last check repeat hgba1c today

## 2012-09-07 NOTE — Progress Notes (Signed)
Patient ID: Barry Anthony, male   DOB: October 25, 1960, 52 y.o.   MRN: [id number] Barry Anthony [id number] 05-29-1960 09/07/2012      Progress Note-Follow Up  Subjective  Chief Complaint  Chief Complaint  Patient presents with  . Follow-up    3 month    HPI  Patient is a 52 year old Caucasian male in today for followup he he has not been exercising due to persistent knee pain. He has pain in both knees and is finding his mobility. He has been eating poorly as well. [facility] but now is not doing so. Denies recent illness. Denies chest pain, palpitations, shortness of breath, GI or GU complaints. 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 Allergic rhinitis   . GERD (gastroesophageal reflux disease)   . Hyperlipidemia   . Hypertension   . History of chicken pox     Past Surgical History  Procedure Laterality Date  . Knee surgery  1981    right knee  . Tonsillectomy  1969    Family History  Problem Relation Age of Onset  . Thyroid cancer Father   . Hypertension Father   . Coronary artery disease Father   . Hyperlipidemia Father   . Other      no colon cancer, no prostate canceer    History   Social History  . Marital Status: Married    Spouse Name: N/A    Number of Children: N/A  . Years of Education: N/A   Occupational History  . Not on file.   Social History Main Topics  . Smoking status: Never Smoker   . Smokeless tobacco: Never Used  . Alcohol Use: Yes     Comment: 2 drinks / week  . Drug Use: Not on file  . Sexual Activity: Not on file   Other Topics Concern  . 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 albuterol (PROAIR HFA)  108 (90 BASE) MCG/ACT inhaler Inhale 2 puffs into the lungs daily as needed.  1 Inhaler  6  . colchicine 0.6 MG tablet Take 1 tablet (0.6 mg total) by mouth 2 (two) times daily as needed.  60 tablet  3  . [DISCONTINUED] metFORMIN (GLUCOPHAGE) 500 MG tablet Take 500 mg by mouth 1 day or 1 dose.        No current facility-administered medications on file prior to visit.    No Known Allergies  Review of Systems  Review of Systems  Constitutional: Positive for malaise/fatigue. Negative for fever.  HENT: Negative for congestion.   Eyes: Negative for discharge.  Respiratory: Negative for shortness of breath.   Cardiovascular: Negative for chest pain, palpitations and leg swelling.  Gastrointestinal: Negative for nausea, abdominal pain and diarrhea.  Genitourinary: Negative for dysuria.  Musculoskeletal: Positive for joint pain. Negative for falls.  Skin: Negative for rash.  Neurological: Negative for loss of consciousness and headaches.  Endo/Heme/Allergies: Negative for polydipsia.  Psychiatric/Behavioral: Negative for depression and suicidal ideas. The patient is not nervous/anxious and does not have insomnia.     Objective  BP 150/100  Pulse 82  Temp(Src) 97.9 F (36.6 C) (Oral)  Ht 5\' 11"  (1.803 m)  Wt 327 lb 1.3 oz (148.363 kg)  BMI 45.64 kg/m2  SpO2 97%  Physical Exam  Physical Exam  Constitutional: He is oriented to person, place, and time and well-developed, well-nourished, and in no distress. No distress.  HENT:  Head: Normocephalic and atraumatic.  Eyes: Conjunctivae are normal.  Neck: Neck supple. No thyromegaly present.  Cardiovascular: Normal rate, regular rhythm and normal heart sounds.   No murmur heard. Pulmonary/Chest: Effort normal and breath sounds normal. No respiratory distress.  Abdominal: He exhibits no distension and no mass. There is no tenderness.  Musculoskeletal: He exhibits no edema.  Neurological: He is alert and oriented to person, place, and  time.  Skin: Skin is warm.  Psychiatric: Memory, affect and judgment normal.    Lab Results  Component Value Date   TSH 1.391 01/15/2012   Lab Results  Component Value Date   WBC 7.9 09/07/2012   HGB 16.1 09/07/2012   HCT 46.1 09/07/2012   MCV 84.9 09/07/2012   PLT 238 09/07/2012   Lab Results  Component Value Date   CREATININE 1.05 03/15/2012   BUN 15 03/15/2012   NA 142 03/15/2012   K 4.5 03/15/2012   CL 99 03/15/2012   CO2 26 03/15/2012   Lab Results  Component Value Date   ALT 33 01/15/2012   AST 26 01/15/2012   ALKPHOS 62 01/15/2012   BILITOT 0.7 01/15/2012   Lab Results  Component Value Date   CHOL 160 01/15/2012   Lab Results  Component Value Date   HDL 38* 01/15/2012   Lab Results  Component Value Date   LDLCALC 84 01/15/2012   Lab Results  Component Value Date   TRIG 192* 01/15/2012   Lab Results  Component Value Date   CHOLHDL 4.2 01/15/2012     Assessment & Plan  DIABETES MELLITUS, TYPE II, BORDERLINE Well controlled on last check repeat hgba1c today  OBESITY Encouraged DASH diet and increase exercise.  Knee pain Is limiting his ability to exercise, consider evaluation by ortho. Start salon pas, megared caps and glucosamine.   HYPERLIPIDEMIA Repeat lipd panel continue Lipitor, given refill today  HYPERTENSION Did not take meds prior to visit and has been feeling well so will not change meds today

## 2012-09-07 NOTE — Assessment & Plan Note (Signed)
Did not take meds prior to visit and has been feeling well so will not change meds today

## 2012-09-07 NOTE — Assessment & Plan Note (Signed)
Repeat lipd panel continue Lipitor, given refill today

## 2012-09-07 NOTE — Patient Instructions (Addendum)
DASH Diet The DASH diet stands for "Dietary Approaches to Stop Hypertension." It is a healthy eating plan that has been shown to reduce high blood pressure (hypertension) in as little as 14 days, while also possibly providing other significant health benefits. These other health benefits include reducing the risk of breast cancer after menopause and reducing the risk of type 2 diabetes, heart disease, colon cancer, and stroke. Health benefits also include weight loss and slowing kidney failure in patients with chronic kidney disease.  DIET GUIDELINES  Limit salt (sodium). Your diet should contain less than 1500 mg of sodium daily.  Limit refined or processed carbohydrates. Your diet should include mostly whole grains. Desserts and added sugars should be used sparingly.  Include small amounts of heart-healthy fats. These types of fats include nuts, oils, and tub margarine. Limit saturated and trans fats. These fats have been shown to be harmful in the body. CHOOSING FOODS  The following food groups are based on a 2000 calorie diet. See your Registered Dietitian for individual calorie needs. Grains and Grain Products (6 to 8 servings daily)  Eat More Often: Whole-wheat bread, brown rice, whole-grain or wheat pasta, quinoa, popcorn without added fat or salt (air popped).  Eat Less Often: White bread, white pasta, white rice, cornbread. Vegetables (4 to 5 servings daily)  Eat More Often: Fresh, frozen, and canned vegetables. Vegetables may be raw, steamed, roasted, or grilled with a minimal amount of fat.  Eat Less Often/Avoid: Creamed or fried vegetables. Vegetables in a cheese sauce. Fruit (4 to 5 servings daily)  Eat More Often: All fresh, canned (in natural juice), or frozen fruits. Dried fruits without added sugar. One hundred percent fruit juice ( cup [237 mL] daily).  Eat Less Often: Dried fruits with added sugar. Canned fruit in light or heavy syrup. [employer], Fish, and Poultry  (2 servings or less daily. One serving is 3 to 4 oz [85-114 g]).  Eat More Often: Ninety percent or leaner ground beef, tenderloin, sirloin. Round cuts of beef, chicken breast, [place] breast. All fish. Grill, bake, or broil your meat. Nothing should be fried.  Eat Less Often/Avoid: Fatty cuts of meat, [place], or chicken leg, thigh, or wing. Fried cuts of meat or fish. Dairy (2 to 3 servings)  Eat More Often: Low-fat or fat-free milk, low-fat plain or light yogurt, reduced-fat or part-skim cheese.  Eat Less Often/Avoid: Milk (whole, 2%).Whole milk yogurt. Full-fat cheeses. Nuts, Seeds, and Legumes (4 to 5 servings per week)  Eat More Often: All without added salt.  Eat Less Often/Avoid: Salted nuts and seeds, canned beans with added salt. Fats and Sweets (limited)  Eat More Often: Vegetable oils, tub margarines without trans fats, sugar-free gelatin. Mayonnaise and salad dressings.  Eat Less Often/Avoid: Coconut oils, palm oils, butter, stick margarine, cream, half and half, cookies, candy, pie. FOR MORE INFORMATION The Dash Diet Eating Plan: www.dashdiet.org Document Released: 12/12/2010 Document Revised: 03/17/2011 Document Reviewed: 12/12/2010 [facility] Patient Information 2014 [place], [place]. Colonoscopy A colonoscopy is an exam to evaluate your entire colon. In this exam, your colon is cleansed. A long fiberoptic tube is inserted through your rectum and into your colon. The fiberoptic scope (endoscope) is a long bundle of enclosed and very flexible fibers. These fibers transmit light to the area examined and send images from that area to your caregiver. Discomfort is usually minimal. You may be given a drug to help you sleep (sedative) during or prior to the procedure. This exam helps to  detect lumps (tumors), polyps, inflammation, and areas of bleeding. Your caregiver may also take a small piece of tissue (biopsy) that will be examined under a microscope. LET YOUR CAREGIVER KNOW  ABOUT:   Allergies to food or medicine.  Medicines taken, including vitamins, herbs, eyedrops, over-the-counter medicines, and creams.  Use of steroids (by mouth or creams).  Previous problems with anesthetics or numbing medicines.  History of bleeding problems or blood clots.  Previous surgery.  Other health problems, including diabetes and kidney problems.  Possibility of pregnancy, if this applies. BEFORE THE PROCEDURE   A clear liquid diet may be required for 2 days before the exam.  Ask your caregiver about changing or stopping your regular medications.  Liquid injections (enemas) or laxatives may be required.  A large amount of electrolyte solution may be given to you to drink over a short period of time. This solution is used to clean out your colon.  You should be present 60 minutes prior to your procedure or as directed by your caregiver. AFTER THE PROCEDURE   If you received a sedative or pain relieving medication, you will need to arrange for someone to drive you home.  Occasionally, there is a little blood passed with the first bowel movement. Do not be concerned. FINDING OUT THE RESULTS OF YOUR TEST Not all test results are available during your visit. If your test results are not back during the visit, make an appointment with your caregiver to find out the results. Do not assume everything is normal if you have not heard from your caregiver or the medical facility. It is important for you to follow up on all of your test results. HOME CARE INSTRUCTIONS   It is not unusual to pass moderate amounts of gas and experience mild abdominal cramping following the procedure. This is due to air being used to inflate your colon during the exam. Walking or a warm pack on your belly (abdomen) may help.  You may resume all normal meals and activities after sedatives and medicines have worn off.  Only take over-the-counter or prescription medicines for pain, discomfort, or  fever as directed by your caregiver. Do not use aspirin or blood thinners if a biopsy was taken. Consult your caregiver for medicine usage if biopsies were taken. SEEK IMMEDIATE MEDICAL CARE IF:   You have a fever.  You pass large blood clots or fill a toilet with blood following the procedure. This may also occur 10 to 14 days following the procedure. This is more likely if a biopsy was taken.  You develop abdominal pain that keeps getting worse and cannot be relieved with medicine. Document Released: 12/21/1999 Document Revised: 03/17/2011 Document Reviewed: 08/05/2007 [facility] Patient Information 2014 [place], [place].

## 2012-09-07 NOTE — Assessment & Plan Note (Signed)
Is limiting his ability to exercise, consider evaluation by ortho. Start salon pas, megared caps and glucosamine.

## 2012-09-07 NOTE — Assessment & Plan Note (Signed)
Encouraged DASH diet and increase exercise.

## 2012-09-08 MED ORDER — ATORVASTATIN CALCIUM 20 MG PO TABS: 20.0000 mg | ORAL_TABLET | Freq: Every day | ORAL | Status: DC

## 2012-09-08 NOTE — Progress Notes (Signed)
Quick Note:  Patient Informed and voiced understanding ______ 

## 2012-09-08 NOTE — Addendum Note (Signed)
Addended by: Court Joy on: 09/08/2012 12:47 PM   Modules accepted: Orders

## 2012-12-07 ENCOUNTER — Ambulatory Visit (HOSPITAL_BASED_OUTPATIENT_CLINIC_OR_DEPARTMENT_OTHER)
Admission: RE | Admit: 2012-12-07 | Discharge: 2012-12-07 | Disposition: A | Discharge status: Home / Self Care | Payer: BC Managed Care – PPO | Source: Ambulatory Visit | Attending: Family Medicine | Admitting: Family Medicine

## 2012-12-07 ENCOUNTER — Telehealth: Payer: Self-pay | Admitting: Family Medicine

## 2012-12-07 ENCOUNTER — Encounter: Payer: Self-pay | Admitting: Family Medicine

## 2012-12-07 ENCOUNTER — Ambulatory Visit (INDEPENDENT_AMBULATORY_CARE_PROVIDER_SITE_OTHER): Payer: BC Managed Care – PPO | Admitting: Family Medicine

## 2012-12-07 ENCOUNTER — Other Ambulatory Visit: Payer: Self-pay | Admitting: Family Medicine

## 2012-12-07 ENCOUNTER — Telehealth: Payer: Self-pay | Admitting: *Deleted

## 2012-12-07 VITALS — BP 132/100 | HR 80 | Temp 98.2°F | Ht 70.0 in | Wt 327.1 lb

## 2012-12-07 DIAGNOSIS — R739 Hyperglycemia, unspecified: Secondary | ICD-10-CM

## 2012-12-07 DIAGNOSIS — R7309 Other abnormal glucose: Secondary | ICD-10-CM

## 2012-12-07 DIAGNOSIS — I10 Essential (primary) hypertension: Secondary | ICD-10-CM

## 2012-12-07 DIAGNOSIS — N433 Hydrocele, unspecified: Secondary | ICD-10-CM | POA: Insufficient documentation

## 2012-12-07 DIAGNOSIS — N509 Disorder of male genital organs, unspecified: Secondary | ICD-10-CM | POA: Insufficient documentation

## 2012-12-07 DIAGNOSIS — E785 Hyperlipidemia, unspecified: Secondary | ICD-10-CM

## 2012-12-07 DIAGNOSIS — N50819 Testicular pain, unspecified: Secondary | ICD-10-CM

## 2012-12-07 DIAGNOSIS — Z23 Encounter for immunization: Secondary | ICD-10-CM

## 2012-12-07 DIAGNOSIS — E669 Obesity, unspecified: Secondary | ICD-10-CM

## 2012-12-07 LAB — RENAL FUNCTION PANEL
BUN: 13 mg/dL (ref 6–23)
Calcium: 9.3 mg/dL (ref 8.4–10.5)
Phosphorus: 2.6 mg/dL (ref 2.3–4.6)
Potassium: 4.9 mEq/L (ref 3.5–5.3)

## 2012-12-07 LAB — CBC
MCV: 83.9 fL (ref 78.0–100.0)
Platelets: 249 10*3/uL (ref 150–400)
RBC: 5.35 MIL/uL (ref 4.22–5.81)
RDW: 13.8 % (ref 11.5–15.5)
WBC: 6.3 10*3/uL (ref 4.0–10.5)

## 2012-12-07 LAB — HEPATIC FUNCTION PANEL
Albumin: 4.2 g/dL (ref 3.5–5.2)
Indirect Bilirubin: 0.5 mg/dL (ref 0.0–0.9)
Total Bilirubin: 0.6 mg/dL (ref 0.3–1.2)
Total Protein: 6.7 g/dL (ref 6.0–8.3)

## 2012-12-07 LAB — LIPID PANEL
Cholesterol: 130 mg/dL (ref 0–200)
HDL: 38 mg/dL — ABNORMAL LOW (ref >39)
Triglycerides: 202 mg/dL — ABNORMAL HIGH (ref <150)

## 2012-12-07 LAB — URIC ACID: Uric Acid, Serum: 9.1 mg/dL — ABNORMAL HIGH (ref 4.0–7.8)

## 2012-12-07 LAB — HEMOGLOBIN A1C
Hgb A1c MFr Bld: 6.2 % — ABNORMAL HIGH (ref <5.7)
Mean Plasma Glucose: 131 mg/dL — ABNORMAL HIGH (ref <117)

## 2012-12-07 MED ORDER — NEBIVOLOL HCL 10 MG PO TABS: 10.0000 mg | ORAL_TABLET | Freq: Every day | ORAL | Status: DC

## 2012-12-07 MED ORDER — PNEUMOCOCCAL 13-VAL CONJ VACC IM SUSP: 0.5000 mL | Freq: Once | INTRAMUSCULAR | Status: DC

## 2012-12-07 NOTE — Progress Notes (Signed)
Patient ID: Barry Anthony, male   DOB: Sep 18, 1960, 52 y.o.   MRN: [id number] [name] [id number] 11/23/1960 12/07/2012      Progress Note-Follow Up  Subjective  Chief Complaint  Chief Complaint  Patient presents with  . Follow-up    3 month  . Injections    prevnar    HPI  Patient is a 52 year old Caucasian male who is in today for followup. He has been tried exercise more. Has been going to the gym 3 times a week. But he does feel somewhat better although he is struggling with some increase in left knee pain. No falls redness or warmth the pain is worse with movement. He reports less headaches with metoprolol but has had some trouble with erectile dysfunction which is new. No other recent illness. He does note recent trouble with some testicular pain more on the left than the right. Not associated with voiding or flatus. Comes and goes. No fevers or chills. No GI or GU concerns otherwise  Past Medical History  Diagnosis Date  . Allergic rhinitis   . GERD (gastroesophageal reflux disease)   . Hyperlipidemia   . Hypertension   . History of chicken pox     Past Surgical History  Procedure Laterality Date  . Knee surgery  1981    right knee  . Tonsillectomy  1969    Family History  Problem Relation Age of Onset  . Thyroid cancer Father   . Hypertension Father   . Coronary artery disease Father   . Hyperlipidemia Father   . Other      no colon cancer, no prostate canceer    History   Social History  . Marital Status: Married    Spouse Name: N/A    Number of Children: N/A  . Years of Education: N/A   Occupational History  . Not on file.   Social History Main Topics  . Smoking status: Never Smoker   . Smokeless tobacco: Never Used  . Alcohol Use: Yes     Comment: 2 drinks / week  . Drug Use: Not on file  . Sexual Activity: Not on file   Other Topics Concern  . Not on file   Social History Narrative   Occupation:  Parts mgr   Married 16 yrs    2  children  10, 12    Never Smoked    Alcohol use-yes (social)     Current Outpatient Prescriptions on File Prior to Visit  Medication Sig Dispense Refill  . albuterol (PROAIR HFA) 108 (90 BASE) MCG/ACT inhaler Inhale 2 puffs into the lungs daily as needed.  1 Inhaler  6  . atorvastatin (LIPITOR) 20 MG tablet Take 1 tablet (20 mg total) by mouth daily.  90 tablet  1  . colchicine 0.6 MG tablet Take 1 tablet (0.6 mg total) by mouth 2 (two) times daily as needed.  60 tablet  3  . fluticasone (FLONASE) 50 MCG/ACT nasal spray Place 2 sprays into the nose daily.  48 g  3  . lisinopril (PRINIVIL,ZESTRIL) 40 MG tablet Take 1 tablet (40 mg total) by mouth daily.  90 tablet  3  . [DISCONTINUED] metFORMIN (GLUCOPHAGE) 500 MG tablet Take 500 mg by mouth 1 day or 1 dose.        No current facility-administered medications on file prior to visit.    No Known Allergies  Review of Systems  Review of Systems  Constitutional: Negative for fever and malaise/fatigue.  HENT: Positive for congestion.   Eyes: Negative for discharge.  Respiratory: Negative for shortness of breath.   Cardiovascular: Negative for chest pain, palpitations and leg swelling.  Gastrointestinal: Negative for nausea, abdominal pain and diarrhea.  Genitourinary: Negative for dysuria.       Testicular pain  Musculoskeletal: Negative for falls.  Skin: Negative for rash.  Neurological: Negative for loss of consciousness and headaches.  Endo/Heme/Allergies: Negative for polydipsia.  Psychiatric/Behavioral: Negative for depression and suicidal ideas. The patient is not nervous/anxious and does not have insomnia.     Objective  BP 132/100  Pulse 80  Temp(Src) 98.2 F (36.8 C) (Oral)  Ht 5\' 10"  (1.778 m)  Wt 327 lb 1.3 oz (148.363 kg)  BMI 46.93 kg/m2  SpO2 97%  Physical Exam  Physical Exam  Constitutional: He is oriented to person, place, and time and well-developed, well-nourished, and in no distress. No distress.   HENT:  Head: Normocephalic and atraumatic.  Eyes: Conjunctivae are normal.  Neck: Neck supple. No thyromegaly present.  Cardiovascular: Normal rate, regular rhythm and normal heart sounds.   No murmur heard. Pulmonary/Chest: Effort normal and breath sounds normal. No respiratory distress.  Abdominal: He exhibits no distension and no mass. There is no tenderness.  Genitourinary: Penis normal. No discharge found.  Left testicle, 1 cm nodule at caudal aspect  Musculoskeletal: He exhibits no edema.  Neurological: He is alert and oriented to person, place, and time.  Skin: Skin is warm.  Psychiatric: Memory, affect and judgment normal.    Lab Results  Component Value Date   TSH 1.417 09/07/2012   Lab Results  Component Value Date   WBC 7.9 09/07/2012   HGB 16.1 09/07/2012   HCT 46.1 09/07/2012   MCV 84.9 09/07/2012   PLT 238 09/07/2012   Lab Results  Component Value Date   CREATININE 1.26 09/07/2012   BUN 14 09/07/2012   NA 138 09/07/2012   K 4.8 09/07/2012   CL 102 09/07/2012   CO2 28 09/07/2012   Lab Results  Component Value Date   ALT 27 09/07/2012   AST 26 09/07/2012   ALKPHOS 56 09/07/2012   BILITOT 0.7 09/07/2012   Lab Results  Component Value Date   CHOL 144 09/07/2012   Lab Results  Component Value Date   HDL 37* 09/07/2012   Lab Results  Component Value Date   LDLCALC 57 09/07/2012   Lab Results  Component Value Date   TRIG 251* 09/07/2012   Lab Results  Component Value Date   CHOLHDL 3.9 09/07/2012     Assessment & Plan  HYPERTENSION Will switch to Bystolic, given samples of 10 mg daily  Bilateral hydrocele C/o some testicular pain, ultrasound revealed hydroceles b/l. Will need to see urology if pain persists.   OBESITY Encouraged DASH diet, has been going to the gyn 3 days a week, encouraged increased exercise as tolerated.  HYPERLIPIDEMIA Mild, avoid trans fats, minimize simple carbs and saturated fats, add krill oil caps  DIABETES MELLITUS, TYPE II, BORDERLINE hgba1c  6.2, minimize simple carbs.

## 2012-12-07 NOTE — Telephone Encounter (Signed)
4 months with MD, labs at or prior lipid, renal, cbc, tsh, hepatic  Patient has appointment 04/11/13 and will be going to [facility] lab

## 2012-12-07 NOTE — Telephone Encounter (Signed)
Message copied by Baldwin [place] on Tue Dec 07, 2012  1:34 PM ------      Message from: Danise Edge A      Created: Tue Dec 07, 2012  1:03 PM       Notify ultrasound reassuring, no massess or torsion only small, hydroceles (fluid collections) b/l no need for further evaluation unless symptoms worsen ------

## 2012-12-07 NOTE — Telephone Encounter (Signed)
Lab order placed.

## 2012-12-07 NOTE — Progress Notes (Signed)
Pre visit review using our clinic review tool, if applicable. No additional management support is needed unless otherwise documented below in the visit note. 

## 2012-12-07 NOTE — Telephone Encounter (Signed)
Spoke with patient made aware of U/S results. Advised to call the office if symptoms worsen.

## 2012-12-07 NOTE — Patient Instructions (Signed)

## 2012-12-08 ENCOUNTER — Encounter: Payer: Self-pay | Admitting: Family Medicine

## 2012-12-08 DIAGNOSIS — N433 Hydrocele, unspecified: Secondary | ICD-10-CM

## 2012-12-08 HISTORY — DX: Hydrocele, unspecified: N43.3

## 2012-12-08 LAB — URINALYSIS
Glucose, UA: NEGATIVE mg/dL
Leukocytes, UA: NEGATIVE
Protein, ur: NEGATIVE mg/dL
Urobilinogen, UA: 0.2 mg/dL (ref 0.0–1.0)

## 2012-12-08 NOTE — Assessment & Plan Note (Signed)
Mild, avoid trans fats, minimize simple carbs and saturated fats, add krill oil caps

## 2012-12-08 NOTE — Assessment & Plan Note (Signed)
Encouraged DASH diet, has been going to the gyn 3 days a week, encouraged increased exercise as tolerated.

## 2012-12-08 NOTE — Assessment & Plan Note (Signed)
Will switch to Bystolic, given samples of 10 mg daily

## 2012-12-08 NOTE — Assessment & Plan Note (Signed)
C/o some testicular pain, ultrasound revealed hydroceles b/l. Will need to see urology if pain persists.

## 2012-12-08 NOTE — Assessment & Plan Note (Signed)
hgba1c 6.2, minimize simple carbs.

## 2012-12-09 NOTE — Progress Notes (Signed)
Quick Note:  Patient Informed and voiced understanding.  RX's sent in (pt asked for advair to be sent to pharmacy also)  Lab order placed ______

## 2013-01-04 ENCOUNTER — Ambulatory Visit (INDEPENDENT_AMBULATORY_CARE_PROVIDER_SITE_OTHER): Payer: BC Managed Care – PPO | Admitting: Family Medicine

## 2013-01-04 VITALS — BP 138/84 | HR 68 | Resp 16 | Ht 70.0 in | Wt 326.0 lb

## 2013-01-04 DIAGNOSIS — I10 Essential (primary) hypertension: Secondary | ICD-10-CM

## 2013-01-04 DIAGNOSIS — J309 Allergic rhinitis, unspecified: Secondary | ICD-10-CM

## 2013-01-04 MED ORDER — NEBIVOLOL HCL 10 MG PO TABS: 10.0000 mg | ORAL_TABLET | Freq: Every day | ORAL | Status: DC

## 2013-01-10 ENCOUNTER — Encounter: Payer: Self-pay | Admitting: Family Medicine

## 2013-01-10 NOTE — Assessment & Plan Note (Signed)
Well controlled, no changes 

## 2013-01-10 NOTE — Progress Notes (Signed)
Patient ID: Barry Anthony, male   DOB: 05/20/1960, 53 y.o.   MRN: [id number] Barry Anthony [id number] May 25, 1960 01/10/2013      Progress Note-Follow Up  Subjective  Chief Complaint  Chief Complaint  Patient presents with  . Blood Pressure Check    Pt here for nurse visit BP check. No new complaints. Pt requests refill of bystolic. Gave 2 bxs and copay card and sent refill.    HPI  Patient is a 53 year old male in today. Has not any cough but mostly itchy scratchy throat. Struggling with fatigue and ear pressure as well. No fevers or chills. No GI or GU complaints. No chest pain or palpitations. No shortness of breath. Has not tried any over-the-counter medications thus far.  Past Medical History  Diagnosis Date  . Allergic rhinitis   . GERD (gastroesophageal reflux disease)   . Hyperlipidemia   . Hypertension   . History of chicken pox   . Bilateral hydrocele 12/08/2012    Past Surgical History  Procedure Laterality Date  . Knee surgery  1981    right knee  . Tonsillectomy  1969    Family History  Problem Relation Age of Onset  . Thyroid cancer Father   . Hypertension Father   . Coronary artery disease Father   . Hyperlipidemia Father   . Other      no colon cancer, no prostate canceer    History   Social History  . Marital Status: Married    Spouse Name: N/A    Number of Children: N/A  . Years of Education: N/A   Occupational History  . Not on file.   Social History Main Topics  . Smoking status: Never Smoker   . Smokeless tobacco: Never Used  . Alcohol Use: Yes     Comment: 2 drinks / week  . Drug Use: Not on file  . Sexual Activity: Not on file   Other Topics Concern  . 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 albuterol (PROAIR HFA) 108 (90  BASE) MCG/ACT inhaler Inhale 2 puffs into the lungs daily as needed.  1 Inhaler  6  . atorvastatin (LIPITOR) 20 MG tablet Take 1 tablet (20 mg total) by mouth daily.  90 tablet  1  . colchicine 0.6 MG tablet Take 1 tablet (0.6 mg total) by mouth 2 (two) times daily as needed.  60 tablet  3  . fluticasone (FLONASE) 50 MCG/ACT nasal spray Place 2 sprays into the nose daily.  48 g  3  . lisinopril (PRINIVIL,ZESTRIL) 40 MG tablet Take 1 tablet (40 mg total) by mouth daily.  90 tablet  3  . [DISCONTINUED] metFORMIN (GLUCOPHAGE) 500 MG tablet Take 500 mg by mouth 1 day or 1 dose.        No current facility-administered medications on file prior to visit.    No Known Allergies  Review of Systems  Review of Systems  Constitutional: Negative for fever and malaise/fatigue.  HENT: Positive for congestion and ear pain. Negative for sore throat.   Eyes: Negative for discharge.  Respiratory: Negative for shortness of breath.   Cardiovascular: Negative for chest pain, palpitations and leg swelling.  Gastrointestinal: Negative for nausea,  abdominal pain and diarrhea.  Genitourinary: Negative for dysuria.  Musculoskeletal: Negative for falls.  Skin: Negative for rash.  Neurological: Negative for loss of consciousness and headaches.  Endo/Heme/Allergies: Negative for polydipsia.  Psychiatric/Behavioral: Negative for depression and suicidal ideas. The patient is not nervous/anxious and does not have insomnia.     Objective  BP 138/84  Pulse 68  Resp 16  Ht 5\' 10"  (1.778 m)  Wt 326 lb (147.873 kg)  BMI 46.78 kg/m2  SpO2 97%  Physical Exam  Physical Exam  Constitutional: He is oriented to person, place, and time and well-developed, well-nourished, and in no distress. No distress.  HENT:  Head: Normocephalic and atraumatic.  Eyes: Conjunctivae are normal.  Neck: Neck supple. No thyromegaly present.  Cardiovascular: Normal rate, regular rhythm and normal heart sounds.   No murmur  heard. Pulmonary/Chest: Effort normal and breath sounds normal. No respiratory distress.  Abdominal: He exhibits no distension and no mass. There is no tenderness.  Musculoskeletal: He exhibits no edema.  Neurological: He is alert and oriented to person, place, and time.  Skin: Skin is warm.  Psychiatric: Memory, affect and judgment normal.    Lab Results  Component Value Date   TSH 0.982 12/07/2012   Lab Results  Component Value Date   WBC 6.3 12/07/2012   HGB 15.7 12/07/2012   HCT 44.9 12/07/2012   MCV 83.9 12/07/2012   PLT 249 12/07/2012   Lab Results  Component Value Date   CREATININE 1.16 12/07/2012   BUN 13 12/07/2012   NA 138 12/07/2012   K 4.9 12/07/2012   CL 101 12/07/2012   CO2 27 12/07/2012   Lab Results  Component Value Date   ALT 31 12/07/2012   AST 23 12/07/2012   ALKPHOS 59 12/07/2012   BILITOT 0.6 12/07/2012   Lab Results  Component Value Date   CHOL 130 12/07/2012   Lab Results  Component Value Date   HDL 38* 12/07/2012   Lab Results  Component Value Date   LDLCALC 52 12/07/2012   Lab Results  Component Value Date   TRIG 202* 12/07/2012   Lab Results  Component Value Date   CHOLHDL 3.4 12/07/2012     Assessment & Plan  HYPERTENSION Well controlled, no changes  ALLERGIC RHINITIS Try Flonase and antihistamines daily

## 2013-01-10 NOTE — Assessment & Plan Note (Signed)
Try Flonase and antihistamines daily

## 2013-01-21 ENCOUNTER — Other Ambulatory Visit: Payer: Self-pay

## 2013-01-21 DIAGNOSIS — I10 Essential (primary) hypertension: Secondary | ICD-10-CM

## 2013-01-21 MED ORDER — NEBIVOLOL HCL 10 MG PO TABS: 10.0000 mg | ORAL_TABLET | Freq: Every day | ORAL | Status: DC

## 2013-01-24 ENCOUNTER — Other Ambulatory Visit: Payer: Self-pay

## 2013-01-24 DIAGNOSIS — I10 Essential (primary) hypertension: Secondary | ICD-10-CM

## 2013-01-24 MED ORDER — NEBIVOLOL HCL 10 MG PO TABS: 10.0000 mg | ORAL_TABLET | Freq: Every day | ORAL | Status: DC

## 2013-03-28 ENCOUNTER — Other Ambulatory Visit: Payer: Self-pay

## 2013-03-28 MED ORDER — COLCHICINE 0.6 MG PO TABS: 0.6000 mg | ORAL_TABLET | Freq: Two times a day (BID) | ORAL | Status: DC | PRN

## 2013-03-28 NOTE — Telephone Encounter (Signed)
Pt is calling in asking for a refill on his colchicine? I went to refill it and got this warning:  Drug-Drug Interaction Report  Colchicine / Statins  Significance: Major  Warning: The combination of atorvastatin with colchicine may result in an increased potential (greater than for each agent alone) for myopathy.  Onset: Delayed Document Level: Suspected  Interacting Medications/Orders:  Colchicine Oral or Non-Oral, Systemic Statins Oral, Systemic  1. colchicine Order: colchicine 0.6 MG tablet Route: Oral Start: 03/28/2013 End: none Frequency: 2 times daily PRN 1. atorvastatin Order ([id number]): atorvastatin (LIPITOR) 20 MG tablet Route: Oral Start: 09/08/2012 End: none Frequency: Daily   Management Code: Professional review suggested  Effects: The combination of atorvastatin with colchicine may result in an increased potential (greater than for each agent alone) for myopathy.  Mechanism: An additive effect for the potential for each agent to cause myopathy may be occurring. Statins may increase plasma concentrations of colchicine through P-glycoprotein inhibition. The presence of renal dysfunction may also play a role.  Management: Clinical monitoring for signs of myopathy is indicated when colchicine and atorvastatin are co-administered.

## 2013-04-11 ENCOUNTER — Ambulatory Visit: Payer: BC Managed Care – PPO | Admitting: Family Medicine

## 2013-04-26 ENCOUNTER — Ambulatory Visit: Payer: BC Managed Care – PPO | Admitting: Family Medicine

## 2013-05-05 ENCOUNTER — Encounter: Payer: Self-pay | Admitting: Physician Assistant

## 2013-05-05 ENCOUNTER — Ambulatory Visit (INDEPENDENT_AMBULATORY_CARE_PROVIDER_SITE_OTHER): Payer: BC Managed Care – PPO | Admitting: Physician Assistant

## 2013-05-05 VITALS — BP 126/78 | HR 70 | Temp 98.6°F | Resp 18 | Ht 70.0 in | Wt 300.8 lb

## 2013-05-05 DIAGNOSIS — J029 Acute pharyngitis, unspecified: Secondary | ICD-10-CM

## 2013-05-05 DIAGNOSIS — J329 Chronic sinusitis, unspecified: Secondary | ICD-10-CM

## 2013-05-05 LAB — POCT RAPID STREP A (OFFICE): RAPID STREP A SCREEN: NEGATIVE

## 2013-05-05 MED ORDER — HYDROCOD POLST-CHLORPHEN POLST 10-8 MG/5ML PO LQCR: 5.0000 mL | Freq: Two times a day (BID) | ORAL | Status: DC | PRN

## 2013-05-05 MED ORDER — AMOXICILLIN-POT CLAVULANATE 875-125 MG PO TABS: 1.0000 | ORAL_TABLET | Freq: Two times a day (BID) | ORAL | Status: DC

## 2013-05-05 NOTE — Progress Notes (Signed)
Patient presents to clinic today c/o head congestions, sinus pressure, sinus pain, PND, brown and thick mucus and non-productive cough x 2.5 weeks.  Patient endorses right-sided neck pain and tension.  Patient denies dysphagia, SOB, fever, chills.  Denies recent travel or sick contact.  Past Medical History  Diagnosis Date  . Allergic rhinitis   . GERD (gastroesophageal reflux disease)   . Hyperlipidemia   . Hypertension   . History of chicken pox   . Bilateral hydrocele 12/08/2012    Current Outpatient Prescriptions on File Prior to Visit  Medication Sig Dispense Refill  . albuterol (PROAIR HFA) 108 (90 BASE) MCG/ACT inhaler Inhale 2 puffs into the lungs daily as needed.  1 Inhaler  6  . atorvastatin (LIPITOR) 20 MG tablet Take 1 tablet (20 mg total) by mouth daily.  90 tablet  1  . colchicine 0.6 MG tablet Take 1 tablet (0.6 mg total) by mouth 2 (two) times daily as needed.  60 tablet  0  . lisinopril (PRINIVIL,ZESTRIL) 40 MG tablet Take 1 tablet (40 mg total) by mouth daily.  90 tablet  3  . nebivolol (BYSTOLIC) 10 MG tablet Take 1 tablet (10 mg total) by mouth daily.  30 tablet  3  . [DISCONTINUED] metFORMIN (GLUCOPHAGE) 500 MG tablet Take 500 mg by mouth 1 day or 1 dose.        No current facility-administered medications on file prior to visit.    No Known Allergies  Family History  Problem Relation Age of Onset  . Thyroid cancer Father   . Hypertension Father   . Coronary artery disease Father   . Hyperlipidemia Father   . Other      no colon cancer, no prostate canceer    History   Social History  . Marital Status: Married    Spouse Name: N/A    Number of Children: N/A  . Years of Education: N/A   Social History Main Topics  . Smoking status: Never Smoker   . Smokeless tobacco: Never Used  . Alcohol Use: Yes     Comment: 2 drinks / week  . Drug Use: None  . Sexual Activity: None   Other Topics Concern  . 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  All other ROS are negative.  BP 126/78  Pulse 70  Temp(Src) 98.6 F (37 C) (Oral)  Resp 18  Ht 5\' 10"  (1.778 m)  Wt 300 lb 12 oz (136.419 kg)  BMI 43.15 kg/m2  SpO2 97%  Physical Exam  Vitals reviewed. Constitutional: He is oriented to person, place, and time and well-developed, well-nourished, and in no distress.  HENT:  Head: Normocephalic and atraumatic.  Right Ear: External ear normal.  Left Ear: External ear normal.  Nose: Nose normal.  Mouth/Throat: Oropharynx is clear and moist. No oropharyngeal exudate.  TM within normal limits.    Eyes: Conjunctivae are normal. Pupils are equal, round, and reactive to light.  Neck: Neck supple.  Cardiovascular: Normal rate, normal heart sounds and intact distal pulses.   Pulmonary/Chest: Effort normal and breath sounds normal. No respiratory distress. He has no wheezes. He has no rales. He exhibits no tenderness.  Lymphadenopathy:    He has no cervical adenopathy.  Neurological: He is alert and oriented  to person, place, and time.  Skin: Skin is warm and dry. No rash noted.  Psychiatric: Affect normal.   Assessment/Plan: Sinusitis Rx Augmentin.  Rx Tussionex for cough. Take antibiotic as prescribed.  Increase fluid intake.  Rest.  Saline nasal spray. Mucinex. Humidifier in bedroom. Warm showers to break up congestion.  Please call or return to clinic if symptoms are not improving.

## 2013-05-05 NOTE — Patient Instructions (Signed)
Please take antibiotic as directed.  Increase fluid intake.  Use Saline nasal spray.  Take a daily multivitamin. Use Tussionex for cough.  Place a humidifier in the bedroom.  Please call or return clinic if symptoms are not improving.  Sinusitis Sinusitis is redness, soreness, and swelling (inflammation) of the paranasal sinuses. Paranasal sinuses are air pockets within the bones of your face (beneath the eyes, the middle of the forehead, or above the eyes). In healthy paranasal sinuses, mucus is able to drain out, and air is able to circulate through them by way of your nose. However, when your paranasal sinuses are inflamed, mucus and air can become trapped. This can allow bacteria and other germs to grow and cause infection. Sinusitis can develop quickly and last only a short time (acute) or continue over a long period (chronic). Sinusitis that lasts for more than 12 weeks is considered chronic.  CAUSES  Causes of sinusitis include:  Allergies.  Structural abnormalities, such as displacement of the cartilage that separates your nostrils (deviated septum), which can decrease the air flow through your nose and sinuses and affect sinus drainage.  Functional abnormalities, such as when the small hairs (cilia) that line your sinuses and help remove mucus do not work properly or are not present. SYMPTOMS  Symptoms of acute and chronic sinusitis are the same. The primary symptoms are pain and pressure around the affected sinuses. Other symptoms include:  Upper toothache.  Earache.  Headache.  Bad breath.  Decreased sense of smell and taste.  A cough, which worsens when you are lying flat.  Fatigue.  Fever.  Thick drainage from your nose, which often is green and may contain pus (purulent).  Swelling and warmth over the affected sinuses. DIAGNOSIS  Your caregiver will perform a physical exam. During the exam, your caregiver may:  Look in your nose for signs of abnormal growths in  your nostrils (nasal polyps).  Tap over the affected sinus to check for signs of infection.  View the inside of your sinuses (endoscopy) with a special imaging device with a light attached (endoscope), which is inserted into your sinuses. If your caregiver suspects that you have chronic sinusitis, one or more of the following tests may be recommended:  Allergy tests.  Nasal culture A sample of mucus is taken from your nose and sent to a lab and screened for bacteria.  Nasal cytology A sample of mucus is taken from your nose and examined by your caregiver to determine if your sinusitis is related to an allergy. TREATMENT  Most cases of acute sinusitis are related to a viral infection and will resolve on their own within 10 days. Sometimes medicines are prescribed to help relieve symptoms (pain medicine, decongestants, nasal steroid sprays, or saline sprays).  However, for sinusitis related to a bacterial infection, your caregiver will prescribe antibiotic medicines. These are medicines that will help kill the bacteria causing the infection.  Rarely, sinusitis is caused by a fungal infection. In theses cases, your caregiver will prescribe antifungal medicine. For some cases of chronic sinusitis, surgery is needed. Generally, these are cases in which sinusitis recurs more than 3 times per year, despite other treatments. HOME CARE INSTRUCTIONS   Drink plenty of water. Water helps thin the mucus so your sinuses can drain more easily.  Use a humidifier.  Inhale steam 3 to 4 times a day (for example, sit in the bathroom with the shower running).  Apply a warm, moist washcloth to your face 3 to   4 times a day, or as directed by your caregiver.  Use saline nasal sprays to help moisten and clean your sinuses.  Take over-the-counter or prescription medicines for pain, discomfort, or fever only as directed by your caregiver. SEEK IMMEDIATE MEDICAL CARE IF:  You have increasing pain or severe  headaches.  You have nausea, vomiting, or drowsiness.  You have swelling around your face.  You have vision problems.  You have a stiff neck.  You have difficulty breathing. MAKE SURE YOU:   Understand these instructions.  Will watch your condition.  Will get help right away if you are not doing well or get worse. Document Released: 12/23/2004 Document Revised: 03/17/2011 Document Reviewed: 01/07/2011 [facility] Patient Information 2014 [place], [place].

## 2013-05-06 DIAGNOSIS — J329 Chronic sinusitis, unspecified: Secondary | ICD-10-CM | POA: Insufficient documentation

## 2013-05-06 NOTE — Assessment & Plan Note (Signed)
Rx Augmentin.  Rx Tussionex for cough. Take antibiotic as prescribed.  Increase fluid intake.  Rest.  Saline nasal spray. Mucinex. Humidifier in bedroom. Warm showers to break up congestion.  Please call or return to clinic if symptoms are not improving.

## 2013-05-06 NOTE — Addendum Note (Signed)
Addended by: Rockwell Germany on: 05/06/2013 12:39 PM   Modules accepted: Orders

## 2013-05-10 LAB — CBC
HCT: 42.2 % (ref 39.0–52.0)
Hemoglobin: 14.7 g/dL (ref 13.0–17.0)
MCH: 29.1 pg (ref 26.0–34.0)
MCHC: 34.8 g/dL (ref 30.0–36.0)
MCV: 83.4 fL (ref 78.0–100.0)
Platelets: 293 10*3/uL (ref 150–400)
RBC: 5.06 MIL/uL (ref 4.22–5.81)
RDW: 13.7 % (ref 11.5–15.5)
WBC: 11 10*3/uL — ABNORMAL HIGH (ref 4.0–10.5)

## 2013-05-11 LAB — HEPATIC FUNCTION PANEL
ALT: 14 U/L (ref 0–53)
AST: 17 U/L (ref 0–37)
Albumin: 4.3 g/dL (ref 3.5–5.2)
Alkaline Phosphatase: 66 U/L (ref 39–117)
Bilirubin, Direct: 0.2 mg/dL (ref 0.0–0.3)
Indirect Bilirubin: 0.6 mg/dL (ref 0.2–1.2)
Total Bilirubin: 0.8 mg/dL (ref 0.2–1.2)
Total Protein: 7.1 g/dL (ref 6.0–8.3)

## 2013-05-11 LAB — RENAL FUNCTION PANEL
Albumin: 4.3 g/dL (ref 3.5–5.2)
BUN: 13 mg/dL (ref 6–23)
CO2: 25 mEq/L (ref 19–32)
Calcium: 9.1 mg/dL (ref 8.4–10.5)
Chloride: 98 mEq/L (ref 96–112)
Creat: 1.08 mg/dL (ref 0.50–1.35)
Glucose, Bld: 113 mg/dL — ABNORMAL HIGH (ref 70–99)
Phosphorus: 3.1 mg/dL (ref 2.3–4.6)
Potassium: 4.2 mEq/L (ref 3.5–5.3)
Sodium: 135 mEq/L (ref 135–145)

## 2013-05-11 LAB — LIPID PANEL
Cholesterol: 129 mg/dL (ref 0–200)
HDL: 36 mg/dL — ABNORMAL LOW (ref >39)
LDL Cholesterol: 70 mg/dL (ref 0–99)
Total CHOL/HDL Ratio: 3.6 Ratio
Triglycerides: 117 mg/dL (ref <150)
VLDL: 23 mg/dL (ref 0–40)

## 2013-05-11 LAB — TSH: TSH: 1.343 u[IU]/mL (ref 0.350–4.500)

## 2013-05-12 ENCOUNTER — Ambulatory Visit: Payer: BC Managed Care – PPO | Admitting: Family Medicine

## 2013-05-17 ENCOUNTER — Telehealth: Payer: Self-pay

## 2013-05-17 ENCOUNTER — Ambulatory Visit (INDEPENDENT_AMBULATORY_CARE_PROVIDER_SITE_OTHER): Payer: BC Managed Care – PPO | Admitting: Family Medicine

## 2013-05-17 ENCOUNTER — Encounter: Payer: Self-pay | Admitting: Family Medicine

## 2013-05-17 VITALS — BP 142/84 | HR 64 | Temp 98.2°F | Ht 71.0 in | Wt 296.1 lb

## 2013-05-17 DIAGNOSIS — I10 Essential (primary) hypertension: Secondary | ICD-10-CM

## 2013-05-17 DIAGNOSIS — J309 Allergic rhinitis, unspecified: Secondary | ICD-10-CM

## 2013-05-17 DIAGNOSIS — K219 Gastro-esophageal reflux disease without esophagitis: Secondary | ICD-10-CM

## 2013-05-17 DIAGNOSIS — N529 Male erectile dysfunction, unspecified: Secondary | ICD-10-CM

## 2013-05-17 DIAGNOSIS — E785 Hyperlipidemia, unspecified: Secondary | ICD-10-CM

## 2013-05-17 DIAGNOSIS — E669 Obesity, unspecified: Secondary | ICD-10-CM

## 2013-05-17 MED ORDER — ATORVASTATIN CALCIUM 20 MG PO TABS: 20.0000 mg | ORAL_TABLET | Freq: Every day | ORAL | Status: DC

## 2013-05-17 MED ORDER — SILDENAFIL CITRATE 50 MG PO TABS: 50.0000 mg | ORAL_TABLET | Freq: Every day | ORAL | Status: DC | PRN

## 2013-05-17 MED ORDER — NEBIVOLOL HCL 10 MG PO TABS: 10.0000 mg | ORAL_TABLET | Freq: Every day | ORAL | Status: DC

## 2013-05-17 NOTE — Telephone Encounter (Signed)
FYI  Patient called and verified that he was only taking the 10 mg Lipitor.  Pt will make sure the rx is the 20 mg when he goes to the pharmacy

## 2013-05-17 NOTE — Progress Notes (Signed)
Pre visit review using our clinic review tool, if applicable. No additional management support is needed unless otherwise documented below in the visit note. 

## 2013-05-17 NOTE — Patient Instructions (Signed)
DASH Diet  The DASH diet stands for "Dietary Approaches to Stop Hypertension." It is a healthy eating plan that has been shown to reduce high blood pressure (hypertension) in as little as 14 days, while also possibly providing other significant health benefits. These other health benefits include reducing the risk of breast cancer after menopause and reducing the risk of type 2 diabetes, heart disease, colon cancer, and stroke. Health benefits also include weight loss and slowing kidney failure in patients with chronic kidney disease.   DIET GUIDELINES  · Limit salt (sodium). Your diet should contain less than 1500 mg of sodium daily.  · Limit refined or processed carbohydrates. Your diet should include mostly whole grains. Desserts and added sugars should be used sparingly.  · Include small amounts of heart-healthy fats. These types of fats include nuts, oils, and tub margarine. Limit saturated and trans fats. These fats have been shown to be harmful in the body.  CHOOSING FOODS   The following food groups are based on a 2000 calorie diet. See your Registered Dietitian for individual calorie needs.  Grains and Grain Products (6 to 8 servings daily)  · Eat More Often: Whole-wheat bread, brown rice, whole-grain or wheat pasta, quinoa, popcorn without added fat or salt (air popped).  · Eat Less Often: White bread, white pasta, white rice, cornbread.  Vegetables (4 to 5 servings daily)  · Eat More Often: Fresh, frozen, and canned vegetables. Vegetables may be raw, steamed, roasted, or grilled with a minimal amount of fat.  · Eat Less Often/Avoid: Creamed or fried vegetables. Vegetables in a cheese sauce.  Fruit (4 to 5 servings daily)  · Eat More Often: All fresh, canned (in natural juice), or frozen fruits. Dried fruits without added sugar. One hundred percent fruit juice (½ cup [237 mL] daily).  · Eat Less Often: Dried fruits with added sugar. Canned fruit in light or heavy syrup.  Lean Meats, Fish, and Poultry (2  servings or less daily. One serving is 3 to 4 oz [85-114 g]).  · Eat More Often: Ninety percent or leaner ground beef, tenderloin, sirloin. Round cuts of beef, chicken breast, turkey breast. All fish. Grill, bake, or broil your meat. Nothing should be fried.  · Eat Less Often/Avoid: Fatty cuts of meat, turkey, or chicken leg, thigh, or wing. Fried cuts of meat or fish.  Dairy (2 to 3 servings)  · Eat More Often: Low-fat or fat-free milk, low-fat plain or light yogurt, reduced-fat or part-skim cheese.  · Eat Less Often/Avoid: Milk (whole, 2%). Whole milk yogurt. Full-fat cheeses.  Nuts, Seeds, and Legumes (4 to 5 servings per week)  · Eat More Often: All without added salt.  · Eat Less Often/Avoid: Salted nuts and seeds, canned beans with added salt.  Fats and Sweets (limited)  · Eat More Often: Vegetable oils, tub margarines without trans fats, sugar-free gelatin. Mayonnaise and salad dressings.  · Eat Less Often/Avoid: Coconut oils, palm oils, butter, stick margarine, cream, half and half, cookies, candy, pie.  FOR MORE INFORMATION  The Dash Diet Eating Plan: www.dashdiet.org  Document Released: 12/12/2010 Document Revised: 03/17/2011 Document Reviewed: 12/12/2010  [employer]® Patient Information ©2014 [employer].

## 2013-05-22 ENCOUNTER — Encounter: Payer: Self-pay | Admitting: Family Medicine

## 2013-05-22 DIAGNOSIS — N529 Male erectile dysfunction, unspecified: Secondary | ICD-10-CM | POA: Insufficient documentation

## 2013-05-22 NOTE — Assessment & Plan Note (Signed)
Encouraged DASH diet, decrease po intake and increase exercise as tolerated. Needs 7-8 hours of sleep nightly. Avoid trans fats, eat small, frequent meals every 4-5 hours with lean proteins, complex carbs and healthy fats. Minimize simple carbs, GMO foods. 

## 2013-05-22 NOTE — Assessment & Plan Note (Signed)
Given rx for Viagra, warned about pssible side effects.

## 2013-05-22 NOTE — Assessment & Plan Note (Signed)
Avoid offending foods, start probiotics. Do not eat large meals in late evening and consider raising head of bed.  

## 2013-05-22 NOTE — Assessment & Plan Note (Signed)
responds to [facility]

## 2013-05-22 NOTE — Assessment & Plan Note (Signed)
Well controlled, no changes to meds. Encouraged heart healthy diet such as the DASH diet and exercise as tolerated.  °

## 2013-05-22 NOTE — Progress Notes (Signed)
Patient ID: Barry Anthony, male   DOB: 10-Jun-1960, 53 y.o.   MRN: [id number] Barry Anthony [id number] 04/13/1960 05/22/2013      Progress Note-Follow Up  Subjective  Chief Complaint  Chief Complaint  Patient presents with  . Follow-up    4 month    HPI  Patient is a 53 year old male in today for routine medical care. He is is feeling fairly well. Does acknowledge recently having trouble with intermittent use erectile dysfunction. He has trouble starting and maintaining at times. Otherwise feels fairly well. He did have a recent sore throat and lymphadenitis but those symptoms have resolved. Denies polyuria polydipsia.Denies CP/palp/SOB/HA/congestion/fevers/GI or GU c/o. Taking meds as prescribed  Past Medical History  Diagnosis Date  . Allergic rhinitis   . GERD (gastroesophageal reflux disease)   . Hyperlipidemia   . Hypertension   . History of chicken pox   . Bilateral hydrocele 12/08/2012    Past Surgical History  Procedure Laterality Date  . Knee surgery  1981    right knee  . Tonsillectomy  1969    Family History  Problem Relation Age of Onset  . Thyroid cancer Father   . Hypertension Father   . Coronary artery disease Father   . Hyperlipidemia Father   . Other      no colon cancer, no prostate canceer    History   Social History  . Marital Status: Married    Spouse Name: N/A    Number of Children: N/A  . Years of Education: N/A   Occupational History  . Not on file.   Social History Main Topics  . Smoking status: Never Smoker   . Smokeless tobacco: Never Used  . Alcohol Use: Yes     Comment: 2 drinks / week  . Drug Use: Not on file  . Sexual Activity: Not on file   Other Topics Concern  . 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 albuterol  (PROAIR HFA) 108 (90 BASE) MCG/ACT inhaler Inhale 2 puffs into the lungs daily as needed.  1 Inhaler  6  . colchicine 0.6 MG tablet Take 1 tablet (0.6 mg total) by mouth 2 (two) times daily as needed.  60 tablet  0  . fluticasone (FLONASE) 50 MCG/ACT nasal spray Place 2 sprays into the nose daily as needed.      Marland Kitchen lisinopril (PRINIVIL,ZESTRIL) 40 MG tablet Take 1 tablet (40 mg total) by mouth daily.  90 tablet  3  . [DISCONTINUED] metFORMIN (GLUCOPHAGE) 500 MG tablet Take 500 mg by mouth 1 day or 1 dose.        No current facility-administered medications on file prior to visit.    No Known Allergies  Review of Systems  Review of Systems  Constitutional: Negative for fever and malaise/fatigue.  HENT: Negative for congestion.   Eyes: Negative for discharge.  Respiratory: Negative for shortness of breath.   Cardiovascular: Negative for chest pain, palpitations and leg swelling.  Gastrointestinal: Negative for nausea, abdominal pain and diarrhea.  Genitourinary: Negative for dysuria.  Musculoskeletal: Negative for falls.  Skin: Negative for rash.  Neurological: Negative for loss of consciousness and headaches.  Endo/Heme/Allergies: Negative for polydipsia.  Psychiatric/Behavioral:  Negative for depression and suicidal ideas. The patient is not nervous/anxious and does not have insomnia.     Objective  BP 142/84  Pulse 64  Temp(Src) 98.2 F (36.8 C) (Oral)  Ht 5\' 11"  (1.803 m)  Wt 296 lb 1.3 oz (134.301 kg)  BMI 41.31 kg/m2  SpO2 97%  Physical Exam  Physical Exam  Constitutional: He is oriented to person, place, and time and well-developed, well-nourished, and in no distress. No distress.  HENT:  Head: Normocephalic and atraumatic.  Eyes: Conjunctivae are normal.  Neck: Neck supple. No thyromegaly present.  Cardiovascular: Normal rate, regular rhythm and normal heart sounds.   No murmur heard. Pulmonary/Chest: Effort normal and breath sounds normal. No respiratory  distress.  Abdominal: He exhibits no distension and no mass. There is no tenderness.  Musculoskeletal: He exhibits no edema.  Neurological: He is alert and oriented to person, place, and time.  Skin: Skin is warm.  Psychiatric: Memory, affect and judgment normal.    Lab Results  Component Value Date   TSH 1.343 05/10/2013   Lab Results  Component Value Date   WBC 11.0* 05/10/2013   HGB 14.7 05/10/2013   HCT 42.2 05/10/2013   MCV 83.4 05/10/2013   PLT 293 05/10/2013   Lab Results  Component Value Date   CREATININE 1.08 05/10/2013   BUN 13 05/10/2013   NA 135 05/10/2013   K 4.2 05/10/2013   CL 98 05/10/2013   CO2 25 05/10/2013   Lab Results  Component Value Date   ALT 14 05/10/2013   AST 17 05/10/2013   ALKPHOS 66 05/10/2013   BILITOT 0.8 05/10/2013   Lab Results  Component Value Date   CHOL 129 05/10/2013   Lab Results  Component Value Date   HDL 36* 05/10/2013   Lab Results  Component Value Date   LDLCALC 70 05/10/2013   Lab Results  Component Value Date   TRIG 117 05/10/2013   Lab Results  Component Value Date   CHOLHDL 3.6 05/10/2013     Assessment & Plan  HYPERTENSION Well controlled, no changes to meds. Encouraged heart healthy diet such as the DASH diet and exercise as tolerated.   OBESITY Encouraged DASH diet, decrease po intake and increase exercise as tolerated. Needs 7-8 hours of sleep nightly. Avoid trans fats, eat small, frequent meals every 4-5 hours with lean proteins, complex carbs and healthy fats. Minimize simple carbs, GMO foods.  ED (erectile dysfunction) Given rx for Viagra, warned about pssible side effects.  GERD Avoid offending foods, start probiotics. Do not eat large meals in late evening and consider raising head of bed.   HYPERLIPIDEMIA Tolerating statin, encouraged heart healthy diet, avoid trans fats, minimize simple carbs and saturated fats. Increase exercise as tolerated  ALLERGIC RHINITIS responds to [facility]

## 2013-05-22 NOTE — Assessment & Plan Note (Signed)
Tolerating statin, encouraged heart healthy diet, avoid trans fats, minimize simple carbs and saturated fats. Increase exercise as tolerated 

## 2013-06-03 ENCOUNTER — Ambulatory Visit: Payer: BC Managed Care – PPO | Admitting: Family Medicine

## 2013-07-19 ENCOUNTER — Ambulatory Visit (INDEPENDENT_AMBULATORY_CARE_PROVIDER_SITE_OTHER): Payer: BC Managed Care – PPO | Admitting: Family

## 2013-07-19 ENCOUNTER — Encounter: Payer: Self-pay | Admitting: Medical

## 2013-07-19 VITALS — BP 146/78 | HR 61 | Temp 98.2°F | Resp 16 | Ht 71.0 in | Wt 290.0 lb

## 2013-07-19 DIAGNOSIS — M542 Cervicalgia: Secondary | ICD-10-CM

## 2013-07-19 MED ORDER — CYCLOBENZAPRINE HCL 5 MG PO TABS: 5.0000 mg | ORAL_TABLET | Freq: Every evening | ORAL | Status: DC | PRN

## 2013-07-19 MED ORDER — METHYLPREDNISOLONE 4 MG PO KIT: PACK | ORAL | Status: DC

## 2013-07-19 NOTE — Progress Notes (Signed)
Pre visit review using our clinic review tool, if applicable. No additional management support is needed unless otherwise documented below in the visit note. 

## 2013-07-19 NOTE — Patient Instructions (Signed)
Start medrol dose pak and flexeril (muscle relaxant) at bedtime for neck pain.  Please call if symptoms worsen, or if not improved in 2 weeks.

## 2013-07-19 NOTE — Progress Notes (Signed)
Subjective:    Patient ID: Barry Anthony, male    DOB: 08/15/1960, 53 y.o.   MRN: [id number]  HPI  Barry Anthony is a 53 yr old male who presents today with chief complaint of neck pain.  Pain has been present since 5/15 and radiates in tho both shoulders. Reports associated swelling around the right clavicle for 2 weeks.   Initially had some neck pain on the right since the winter time.  Neck feels stiff.  Has tried ibuprofen on occasion with mild improvement.  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     Review of Systems See HPI  Past Medical History  Diagnosis Date  . Allergic rhinitis   . GERD (gastroesophageal reflux disease)   . Hyperlipidemia   . Hypertension   . History of chicken pox   . Bilateral hydrocele 12/08/2012    History   Social History  . Marital Status: Married    Spouse Name: N/A    Number of Children: N/A  . Years of Education: N/A   Occupational History  . Not on file.   Social History Main Topics  . Smoking status: Never Smoker   . Smokeless tobacco: Never Used  . Alcohol Use: Yes     Comment: 2 drinks / week  . Drug Use: Not on file  . Sexual Activity: Not on file   Other Topics Concern  . Not on file   Social History Narrative   Occupation:  Parts mgr   Married 16 yrs    2 children  29, 12    Never Smoked    Alcohol use-yes (social)     Past Surgical History  Procedure Laterality Date  . Knee surgery  1981    right knee  . Tonsillectomy  1969    Family History  Problem Relation Age of Onset  . Thyroid cancer Father   . Hypertension Father   . Coronary artery disease Father   . Hyperlipidemia Father   . Other      no colon cancer, no prostate canceer    No Known Allergies  Current Outpatient Prescriptions on File Prior to Visit  Medication Sig Dispense Refill  . albuterol (PROAIR HFA) 108 (90 BASE) MCG/ACT inhaler Inhale 2 puffs into the lungs daily as needed.  1 Inhaler  6  . atorvastatin (LIPITOR) 20 MG tablet  Take 1 tablet (20 mg total) by mouth daily.  90 tablet  1  . colchicine 0.6 MG tablet Take 1 tablet (0.6 mg total) by mouth 2 (two) times daily as needed.  60 tablet  0  . fluticasone (FLONASE) 50 MCG/ACT nasal spray Place 2 sprays into the nose daily as needed.      Marland Kitchen lisinopril (PRINIVIL,ZESTRIL) 40 MG tablet Take 1 tablet (40 mg total) by mouth daily.  90 tablet  3  . nebivolol (BYSTOLIC) 10 MG tablet Take 1 tablet (10 mg total) by mouth daily.  30 tablet  3  . sildenafil (VIAGRA) 50 MG tablet Take 1 tablet (50 mg total) by mouth daily as needed for erectile dysfunction.  10 tablet  0  . [DISCONTINUED] metFORMIN (GLUCOPHAGE) 500 MG tablet Take 500 mg by mouth 1 day or 1 dose.        No current facility-administered medications on file prior to visit.    BP 146/78  Pulse 61  Temp(Src) 98.2 F (36.8 C) (Oral)  Resp 16  Ht 5\' 11"  (1.803 m)  Wt 290 lb (  131.543 kg)  BMI 40.46 kg/m2  SpO2 96%       Objective:   Physical Exam  Constitutional: He appears well-developed and well-nourished. No distress.  HENT:  Head: Normocephalic and atraumatic.  Neck:  Posterior cervical tenderness to palpation  No supraclavicular Lymphadenopathy noted  Cardiovascular: Normal rate and regular rhythm.   No murmur heard. Pulmonary/Chest: Effort normal and breath sounds normal. No respiratory distress. He has no wheezes. He has no rales. He exhibits no tenderness.  Musculoskeletal: He exhibits no edema.  + tenderness right trapezius. Slight soft tissue swelling overlying right clavicle.  Lymphadenopathy:    He has no cervical adenopathy.          Assessment & Plan:

## 2013-07-21 DIAGNOSIS — M542 Cervicalgia: Secondary | ICD-10-CM | POA: Insufficient documentation

## 2013-07-21 NOTE — Assessment & Plan Note (Signed)
Likely underlying cervical disc disease. Trial of medrol dose pak, HS flexeril PRN.  If symptoms worsen or if symptoms do not improve consider MRI of the cspine.

## 2013-08-16 ENCOUNTER — Telehealth: Payer: Self-pay

## 2013-08-16 DIAGNOSIS — E785 Hyperlipidemia, unspecified: Secondary | ICD-10-CM

## 2013-08-16 DIAGNOSIS — R7309 Other abnormal glucose: Secondary | ICD-10-CM

## 2013-08-16 DIAGNOSIS — I10 Essential (primary) hypertension: Secondary | ICD-10-CM

## 2013-08-16 LAB — LIPID PANEL
CHOL/HDL RATIO: 3 ratio
Cholesterol: 112 mg/dL (ref 0–200)
HDL: 37 mg/dL — AB (ref >39)
LDL Cholesterol: 48 mg/dL (ref 0–99)
Triglycerides: 134 mg/dL (ref <150)
VLDL: 27 mg/dL (ref 0–40)

## 2013-08-16 LAB — HEPATIC FUNCTION PANEL
ALT: 19 U/L (ref 0–53)
AST: 22 U/L (ref 0–37)
Albumin: 4.3 g/dL (ref 3.5–5.2)
Alkaline Phosphatase: 43 U/L (ref 39–117)
Bilirubin, Direct: 0.2 mg/dL (ref 0.0–0.3)
Indirect Bilirubin: 0.6 mg/dL (ref 0.2–1.2)
TOTAL PROTEIN: 6.9 g/dL (ref 6.0–8.3)
Total Bilirubin: 0.8 mg/dL (ref 0.2–1.2)

## 2013-08-16 LAB — CBC
HCT: 41.7 % (ref 39.0–52.0)
Hemoglobin: 14.2 g/dL (ref 13.0–17.0)
MCH: 28.9 pg (ref 26.0–34.0)
MCHC: 34.1 g/dL (ref 30.0–36.0)
MCV: 84.8 fL (ref 78.0–100.0)
PLATELETS: 260 10*3/uL (ref 150–400)
RBC: 4.92 MIL/uL (ref 4.22–5.81)
RDW: 14.5 % (ref 11.5–15.5)
WBC: 6.2 10*3/uL (ref 4.0–10.5)

## 2013-08-16 LAB — RENAL FUNCTION PANEL
ALBUMIN: 4.3 g/dL (ref 3.5–5.2)
BUN: 15 mg/dL (ref 6–23)
CO2: 28 meq/L (ref 19–32)
Calcium: 9.6 mg/dL (ref 8.4–10.5)
Chloride: 101 mEq/L (ref 96–112)
Creat: 1.15 mg/dL (ref 0.50–1.35)
GLUCOSE: 104 mg/dL — AB (ref 70–99)
Phosphorus: 3.6 mg/dL (ref 2.3–4.6)
Potassium: 5 mEq/L (ref 3.5–5.3)
Sodium: 137 mEq/L (ref 135–145)

## 2013-08-16 LAB — HEMOGLOBIN A1C
Hgb A1c MFr Bld: 5.6 % (ref <5.7)
Mean Plasma Glucose: 114 mg/dL (ref <117)

## 2013-08-16 LAB — TSH: TSH: 1.388 u[IU]/mL (ref 0.350–4.500)

## 2013-08-16 NOTE — Telephone Encounter (Signed)
Patient came in to have labs drawn. I put in orders

## 2013-08-18 ENCOUNTER — Ambulatory Visit (INDEPENDENT_AMBULATORY_CARE_PROVIDER_SITE_OTHER): Payer: BC Managed Care – PPO | Admitting: Family Medicine

## 2013-08-18 ENCOUNTER — Ambulatory Visit (HOSPITAL_BASED_OUTPATIENT_CLINIC_OR_DEPARTMENT_OTHER)
Admission: RE | Admit: 2013-08-18 | Discharge: 2013-08-18 | Disposition: A | Discharge status: Home / Self Care | Payer: BC Managed Care – PPO | Source: Ambulatory Visit | Attending: Family Medicine | Admitting: Family Medicine

## 2013-08-18 ENCOUNTER — Encounter: Payer: Self-pay | Admitting: Family Medicine

## 2013-08-18 VITALS — BP 124/80 | HR 63 | Temp 98.1°F | Ht 71.0 in | Wt 280.1 lb

## 2013-08-18 DIAGNOSIS — R7309 Other abnormal glucose: Secondary | ICD-10-CM

## 2013-08-18 DIAGNOSIS — M542 Cervicalgia: Secondary | ICD-10-CM

## 2013-08-18 DIAGNOSIS — M25511 Pain in right shoulder: Secondary | ICD-10-CM

## 2013-08-18 DIAGNOSIS — E785 Hyperlipidemia, unspecified: Secondary | ICD-10-CM

## 2013-08-18 DIAGNOSIS — M47812 Spondylosis without myelopathy or radiculopathy, cervical region: Secondary | ICD-10-CM | POA: Diagnosis not present

## 2013-08-18 DIAGNOSIS — N529 Male erectile dysfunction, unspecified: Secondary | ICD-10-CM

## 2013-08-18 DIAGNOSIS — I10 Essential (primary) hypertension: Secondary | ICD-10-CM

## 2013-08-18 DIAGNOSIS — E669 Obesity, unspecified: Secondary | ICD-10-CM

## 2013-08-18 DIAGNOSIS — M25519 Pain in unspecified shoulder: Secondary | ICD-10-CM | POA: Diagnosis present

## 2013-08-18 DIAGNOSIS — K219 Gastro-esophageal reflux disease without esophagitis: Secondary | ICD-10-CM

## 2013-08-18 MED ORDER — SILDENAFIL CITRATE 20 MG PO TABS: ORAL_TABLET | ORAL | Status: DC

## 2013-08-18 NOTE — Patient Instructions (Signed)
With PSA  Basic Carbohydrate Counting for Diabetes Mellitus Carbohydrate counting is a method for keeping track of the amount of carbohydrates you eat. Eating carbohydrates naturally increases the level of sugar (glucose) in your blood, so it is important for you to know the amount that is okay for you to have in every meal. Carbohydrate counting helps keep the level of glucose in your blood within normal limits. The amount of carbohydrates allowed is different for every person. A dietitian can help you calculate the amount that is right for you. Once you know the amount of carbohydrates you can have, you can count the carbohydrates in the foods you want to eat. Carbohydrates are found in the following foods:  Grains, such as breads and cereals.  Dried beans and soy products.  Starchy vegetables, such as potatoes, peas, and corn.  Fruit and fruit juices.  Milk and yogurt.  Sweets and snack foods, such as cake, cookies, candy, chips, soft drinks, and fruit drinks. CARBOHYDRATE COUNTING There are two ways to count the carbohydrates in your food. You can use either of the methods or a combination of both. Reading the "Nutrition Facts" on [place] The "Nutrition Facts" is an area that is included on the labels of almost all packaged food and beverages in the [place]. It includes the serving size of that food or beverage and information about the nutrients in each serving of the food, including the grams (g) of carbohydrate per serving.  Decide the number of servings of this food or beverage that you will be able to eat or drink. Multiply that number of servings by the number of grams of carbohydrate that is listed on the label for that serving. The total will be the amount of carbohydrates you will be having when you eat or drink this food or beverage. Learning Standard Serving Sizes of Food When you eat food that is not packaged or does not include "Nutrition Facts" on the label, you  need to measure the servings in order to count the amount of carbohydrates.A serving of most carbohydrate-rich foods contains about 15 g of carbohydrates. The following list includes serving sizes of carbohydrate-rich foods that provide 15 g ofcarbohydrate per serving:   1 slice of bread (1 oz) or 1 six-inch tortilla.    of a hamburger bun or English muffin.  4-6 crackers.   cup unsweetened dry cereal.    cup hot cereal.   cup rice or pasta.    cup mashed potatoes or  of a large baked potato.  1 cup fresh fruit or one small piece of fruit.    cup canned or frozen fruit or fruit juice.  1 cup milk.   cup plain fat-free yogurt or yogurt sweetened with artificial sweeteners.   cup cooked dried beans or starchy vegetable, such as peas, corn, or potatoes.  Decide the number of standard-size servings that you will eat. Multiply that number of servings by 15 (the grams of carbohydrates in that serving). For example, if you eat 2 cups of strawberries, you will have eaten 2 servings and 30 g of carbohydrates (2 servings x 15 g = 30 g). For foods such as soups and casseroles, in which more than one food is mixed in, you will need to count the carbohydrates in each food that is included. EXAMPLE OF CARBOHYDRATE COUNTING Sample Dinner  3 oz chicken breast.   cup of brown rice.   cup of corn.  1 cup milk.   1  cup strawberries with sugar-free whipped topping.  Carbohydrate Calculation Step 1: Identify the foods that contain carbohydrates:   Rice.   Corn.   Milk.   Strawberries. Step 2:Calculate the number of servings eaten of each:   2 servings of rice.   1 serving of corn.   1 serving of milk.   1 serving of strawberries. Step 3: Multiply each of those number of servings by 15 g:   2 servings of rice x 15 g = 30 g.   1 serving of corn x 15 g = 15 g.   1 serving of milk x 15 g = 15 g.   1 serving of strawberries x 15 g = 15 g. Step 4:  Add together all of the amounts to find the total grams of carbohydrates eaten: 30 g + 15 g + 15 g + 15 g = 75 g. Document Released: 12/23/2004 Document Revised: 05/09/2013 Document Reviewed: 11/19/2012 [facility] Patient Information 2015 [place], [place]. This information is not intended to replace advice given to you by your health care provider. Make sure you discuss any questions you have with your health care provider.

## 2013-08-18 NOTE — Progress Notes (Signed)
Pre visit review using our clinic review tool, if applicable. No additional management support is needed unless otherwise documented below in the visit note. 

## 2013-08-21 ENCOUNTER — Encounter: Payer: Self-pay | Admitting: Family Medicine

## 2013-08-21 DIAGNOSIS — M25519 Pain in unspecified shoulder: Secondary | ICD-10-CM | POA: Insufficient documentation

## 2013-08-21 DIAGNOSIS — M542 Cervicalgia: Secondary | ICD-10-CM | POA: Insufficient documentation

## 2013-08-21 NOTE — Assessment & Plan Note (Signed)
hgba1c acceptable, minimize simple carbs. Increase exercise as tolerated. Continue current meds 

## 2013-08-21 NOTE — Assessment & Plan Note (Signed)
Try moist heat and topical treatments. Referred to sports med for further consideration

## 2013-08-21 NOTE — Assessment & Plan Note (Signed)
Recent good weight loss with reduction in portion sizes. Encouraged DASH diet, decrease po intake and increase exercise as tolerated. Needs 7-8 hours of sleep nightly. Avoid trans fats, eat small, frequent meals every 4-5 hours with lean proteins, complex carbs and healthy fats. Minimize simple carbs, GMO foods.

## 2013-08-21 NOTE — Assessment & Plan Note (Signed)
Avoid offending foods, start probiotics. Do not eat large meals in late evening and consider raising head of bed.  

## 2013-08-21 NOTE — Assessment & Plan Note (Signed)
Tolerating statin, encouraged heart healthy diet, avoid trans fats, minimize simple carbs and saturated fats. Increase exercise as tolerated 

## 2013-08-21 NOTE — Assessment & Plan Note (Signed)
Well controlled, no changes to meds. Encouraged heart healthy diet such as the DASH diet and exercise as tolerated.  °

## 2013-08-21 NOTE — Progress Notes (Signed)
Patient ID: Barry Anthony, male   DOB: [birth year], 53 y.o.   MRN: [id number] Barry Anthony [id number] 09-Oct-1960 08/21/2013      Progress Note-Follow Up  Subjective  Chief Complaint  Chief Complaint  Patient presents with  . Follow-up    3 month    HPI  Patient is a 53 year old male in today for routine medical care. He is doing fairly well. No polyuria or polydipsia. He's begun to walk more and eating less try to cut back on carbs. His biggest complaint is of persistent right-sided neck and right shoulder pain. He feels there is some swelling over his right clavicle and pain as a result. Ibuprofen is helpful temporarily. No radicular symptoms. No recent illness. Denies CP/palp/SOB/HA/congestion/fevers/GI or GU c/o. Taking meds as prescribed  Past Medical History  Diagnosis Date  . Allergic rhinitis   . GERD (gastroesophageal reflux disease)   . Hyperlipidemia   . Hypertension   . History of chicken pox   . Bilateral hydrocele 12/08/2012    Past Surgical History  Procedure Laterality Date  . Knee surgery  1981    right knee  . Tonsillectomy  1969    Family History  Problem Relation Age of Onset  . Thyroid cancer Father   . Hypertension Father   . Coronary artery disease Father   . Hyperlipidemia Father   . Other      no colon cancer, no prostate canceer    History   Social History  . Marital Status: Married    Spouse Name: N/A    Number of Children: N/A  . Years of Education: N/A   Occupational History  . Not on file.   Social History Main Topics  . Smoking status: Never Smoker   . Smokeless tobacco: Never Used  . Alcohol Use: Yes     Comment: 2 drinks / week  . Drug Use: Not on file  . Sexual Activity: Not on file   Other Topics Concern  . Not on file   Social History Narrative   Occupation:  Parts mgr   Married 16 yrs    2 children  10, 12    Never Smoked    Alcohol use-yes (social)     Current Outpatient Prescriptions on File Prior to Visit   Medication Sig Dispense Refill  . albuterol (PROAIR HFA) 108 (90 BASE) MCG/ACT inhaler Inhale 2 puffs into the lungs daily as needed.  1 Inhaler  6  . atorvastatin (LIPITOR) 20 MG tablet Take 1 tablet (20 mg total) by mouth daily.  90 tablet  1  . colchicine 0.6 MG tablet Take 1 tablet (0.6 mg total) by mouth 2 (two) times daily as needed.  60 tablet  0  . fluticasone (FLONASE) 50 MCG/ACT nasal spray Place 2 sprays into the nose daily as needed.      Marland Kitchen lisinopril (PRINIVIL,ZESTRIL) 40 MG tablet Take 1 tablet (40 mg total) by mouth daily.  90 tablet  3  . nebivolol (BYSTOLIC) 10 MG tablet Take 1 tablet (10 mg total) by mouth daily.  30 tablet  3  . [DISCONTINUED] metFORMIN (GLUCOPHAGE) 500 MG tablet Take 500 mg by mouth 1 day or 1 dose.        No current facility-administered medications on file prior to visit.    No Known Allergies  Review of Systems  Review of Systems  Constitutional: Negative for fever and malaise/fatigue.  HENT: Negative for congestion.   Eyes: Negative for discharge.  Respiratory: Negative for shortness of breath.   Cardiovascular: Negative for chest pain, palpitations and leg swelling.  Gastrointestinal: Negative for nausea, abdominal pain and diarrhea.  Genitourinary: Negative for dysuria.  Musculoskeletal: Positive for joint pain and neck pain. Negative for falls.       Right shoulder  Skin: Negative for rash.  Neurological: Negative for loss of consciousness and headaches.  Endo/Heme/Allergies: Negative for polydipsia.  Psychiatric/Behavioral: Negative for depression and suicidal ideas. The patient is not nervous/anxious and does not have insomnia.     Objective  BP 124/80  Pulse 63  Temp(Src) 98.1 F (36.7 C) (Oral)  Ht 5\' 11"  (1.803 m)  Wt 280 lb 1.3 oz (127.043 kg)  BMI 39.08 kg/m2  SpO2 97%  Physical Exam  Physical Exam  Constitutional: He is oriented to person, place, and time and well-developed, well-nourished, and in no distress. No  distress.  HENT:  Head: Normocephalic and atraumatic.  Eyes: Conjunctivae are normal.  Neck: Neck supple. No thyromegaly present.  Cardiovascular: Normal rate, regular rhythm and normal heart sounds.   No murmur heard. Pulmonary/Chest: Effort normal and breath sounds normal. No respiratory distress.  Abdominal: He exhibits no distension and no mass. There is no tenderness.  Musculoskeletal: He exhibits no edema.  Notes hypertrophy in tissue over right clavicle  Neurological: He is alert and oriented to person, place, and time.  Skin: Skin is warm.  Psychiatric: Memory, affect and judgment normal.    Lab Results  Component Value Date   TSH 1.388 08/16/2013   Lab Results  Component Value Date   WBC 6.2 08/16/2013   HGB 14.2 08/16/2013   HCT 41.7 08/16/2013   MCV 84.8 08/16/2013   PLT 260 08/16/2013   Lab Results  Component Value Date   CREATININE 1.15 08/16/2013   BUN 15 08/16/2013   NA 137 08/16/2013   K 5.0 08/16/2013   CL 101 08/16/2013   CO2 28 08/16/2013   Lab Results  Component Value Date   ALT 19 08/16/2013   AST 22 08/16/2013   ALKPHOS 43 08/16/2013   BILITOT 0.8 08/16/2013   Lab Results  Component Value Date   CHOL 112 08/16/2013   Lab Results  Component Value Date   HDL 37* 08/16/2013   Lab Results  Component Value Date   LDLCALC 48 08/16/2013   Lab Results  Component Value Date   TRIG 134 08/16/2013   Lab Results  Component Value Date   CHOLHDL 3.0 08/16/2013     Assessment & Plan  HYPERTENSION Well controlled, no changes to meds. Encouraged heart healthy diet such as the DASH diet and exercise as tolerated.   GERD Avoid offending foods, start probiotics. Do not eat large meals in late evening and consider raising head of bed.   OBESITY Recent good weight loss with reduction in portion sizes. Encouraged DASH diet, decrease po intake and increase exercise as tolerated. Needs 7-8 hours of sleep nightly. Avoid trans fats, eat small, frequent meals every 4-5  hours with lean proteins, complex carbs and healthy fats. Minimize simple carbs, GMO foods.  HYPERLIPIDEMIA Tolerating statin, encouraged heart healthy diet, avoid trans fats, minimize simple carbs and saturated fats. Increase exercise as tolerated  DIABETES MELLITUS, TYPE II, BORDERLINE hgba1c acceptable, minimize simple carbs. Increase exercise as tolerated. Continue current meds  Pain in joint, shoulder region Try moist heat and topical treatments. Referred to sports med for further consideration

## 2013-08-26 ENCOUNTER — Ambulatory Visit: Payer: BC Managed Care – PPO | Admitting: Family Medicine

## 2013-08-30 ENCOUNTER — Ambulatory Visit (INDEPENDENT_AMBULATORY_CARE_PROVIDER_SITE_OTHER): Payer: BC Managed Care – PPO | Admitting: Family Medicine

## 2013-08-30 ENCOUNTER — Ambulatory Visit (INDEPENDENT_AMBULATORY_CARE_PROVIDER_SITE_OTHER): Payer: BC Managed Care – PPO

## 2013-08-30 ENCOUNTER — Encounter: Payer: Self-pay | Admitting: Family Medicine

## 2013-08-30 VITALS — BP 136/78 | HR 67 | Ht 71.0 in | Wt 285.0 lb

## 2013-08-30 DIAGNOSIS — M25511 Pain in right shoulder: Secondary | ICD-10-CM

## 2013-08-30 DIAGNOSIS — M25519 Pain in unspecified shoulder: Secondary | ICD-10-CM

## 2013-08-30 DIAGNOSIS — M67919 Unspecified disorder of synovium and tendon, unspecified shoulder: Secondary | ICD-10-CM

## 2013-08-30 DIAGNOSIS — M7551 Bursitis of right shoulder: Secondary | ICD-10-CM

## 2013-08-30 DIAGNOSIS — M719 Bursopathy, unspecified: Secondary | ICD-10-CM

## 2013-08-30 MED ORDER — MELOXICAM 15 MG PO TABS: 15.0000 mg | ORAL_TABLET | Freq: Every day | ORAL | Status: DC

## 2013-08-30 NOTE — Assessment & Plan Note (Signed)
Patient did have mild bursitis of the right shoulder today. Patient did respond well to an injection. I do not think there is any cervical radiculopathy at this time the patient has a negative Spurling's test. Patient also does have what appears to be an [place] separation that could be giving him some malalignment to the shoulder. Patient was given home exercises, icing protocol, as well as anti-inflammatories prescribed. Patient will come back again in 3 weeks for further evaluation and treatment.

## 2013-08-30 NOTE — Patient Instructions (Signed)
Very nice to meet you Ice 20 minutes 2 times daily. Usually after activity and before bed. Exercises 3 times a week.  Meloixcam daily for 10 days then as needed Vitamin D 2000 IU daily.  Screen at eye level.  Keyboard with elbows at 90 degreses In chair you are going to place tennisball between shoulder blades and duct tape  Come back again in 3 weeks.

## 2013-08-30 NOTE — Assessment & Plan Note (Signed)
Patient does have what appears to be in sternoclavicular separation. There is mild deformity noted compared to the contralateral side. We discussed an icing protocol that I think will be beneficial. Patient was given home exercise program as well. Patient has no fevers or chills or any abnormal weight loss recently saw did not think that this is infectious. Patient does have any of those signs he will come back for further evaluation. Patient does have an overlying cyst is likely from the trauma itself. We will monitor closely. Continuing to give him pain we may consider aspiration of the cyst as well as further imaging. Patient will come back in 3 weeks.

## 2013-08-30 NOTE — Progress Notes (Signed)
[facility] [address], [place] [address] Phone: [phone] Subjective:    I'm seeing this patient by the request  of:  Penni Homans, MD   CC: Right shoulder pain  MEQ:[id number] MELVIN WHITEFORD is a 53 y.o. male coming in with complaint of right-sided shoulder pain as well as some right-sided neck pain. Patient has had this pain for quite some time. Patient states that it seems to be getting worse. Patient was sent for x-rays previously of his neck. I did review his next x-rays. August 18, 2013 cervical neck x-rays do show cervical spondylosis with C5-6 and C6-7 being the most concerning. There is also foraminal narrowing at this level especially on the right. The patient states he recently thinks he may have heard a multiple month ago when he was lifting. States that it seemed to start near his chest wall and out towards his shoulder. Patient states now has more of a dull aching pain that seems to be localized in the shoulder and can radiate towards his neck. Denies any significant radiation, numbness or weakness of the arm. States that the soreness can keep him up at night it does not wake him up at night. Does respond to anti-inflammatories. Patient was the severity of 6/10.    Past medical history, social, surgical and family history all reviewed in electronic medical record.   Review of Systems: No headache, visual changes, nausea, vomiting, diarrhea, constipation, dizziness, abdominal pain, skin rash, fevers, chills, night sweats, weight loss, swollen lymph nodes, body aches, joint swelling, muscle aches, chest pain, shortness of breath, mood changes.   Objective Blood pressure 136/78, pulse 67, height 5\' 11"  (1.803 m), weight 285 lb (129.275 kg), SpO2 96.00%.  General: No apparent distress alert and oriented x3 mood and affect normal, dressed appropriately.  HEENT: Pupils equal, extraocular movements intact  Respiratory: Patient's speak in  full sentences and does not appear short of breath  Cardiovascular: No lower extremity edema, non tender, no erythema  Skin: Warm dry intact with no signs of infection or rash on extremities or on axial skeleton.  Abdomen: Soft nontender  Neuro: Cranial nerves II through XII are intact, neurovascularly intact in all extremities with 2+ DTRs and 2+ pulses.  Lymph: No lymphadenopathy of posterior or anterior cervical chain or axillae bilaterally.  Gait normal with good balance and coordination.  MSK:  Non tender with full range of motion and good stability and symmetric strength and tone of elbows, wrist, hip, knee and ankles bilaterally.  Neck: Inspection unremarkable. No palpable stepoffs. Negative Spurling's maneuver. Full neck range of motion Grip strength and sensation normal in bilateral hands Strength good C4 to T1 distribution No sensory change to C4 to T1 Negative Hoffman sign bilaterally Reflexes normal  Shoulder: Right Inspection reveals no abnormalities, atrophy or asymmetry. Palpation is normal with no tenderness over AC joint or bicipital groove. ROM is full in all planes passively. Rotator cuff strength normal throughout. signs of impingement with positive Neer and Hawkin's tests, but negative empty can sign. Speeds and Yergason's tests normal. No labral pathology noted with negative Obrien's, negative clunk and good stability. Normal scapular function observed. No painful arc and no drop arm sign. No apprehension sign On exam the patient also does have some mild abnormality of the [place] joints with it seems to be anterior compared to the contralateral side. Contralateral shoulder unremarkable  MSK US performed of: Right This study was ordered, performed, and interpreted by Thedore Mins  Kathlene Yano D.O.  Shoulder:   Supraspinatus:  Appears normal on long and transverse views, Bursal bulge seen with shoulder abduction on impingement view. Infraspinatus:  Appears normal on long and  transverse views. Significant increase in Doppler flow Subscapularis:  Appears normal on long and transverse views. Positive bursa Teres Minor:  Appears normal on long and transverse views. AC joint:  Capsule undistended, no geyser sign. Glenohumeral Joint:  Appears normal without effusion. Glenoid Labrum:  Intact without visualized tears. Biceps Tendon:  Appears normal on long and transverse views, no fraying of tendon, tendon located in intertubercular groove, no subluxation with shoulder internal or external rotation. [place] joint shows mild separation as well as what appears to be a post traumatic cyst overlying area just lateral to joint  Impression: Subacromial bursitis mild [place] separation  Procedure: Real-time Ultrasound Guided Injection of right glenohumeral joint Device: GE Logiq E  Ultrasound guided injection is preferred based studies that show increased duration, increased effect, greater accuracy, decreased procedural pain, increased response rate with ultrasound guided versus blind injection.  Verbal informed consent obtained.  Time-out conducted.  Noted no overlying erythema, induration, or other signs of local infection.  Skin prepped in a sterile fashion.  Local anesthesia: Topical Ethyl chloride.  With sterile technique and under real time ultrasound guidance:  Joint visualized.  23g 1  inch needle inserted posterior approach. Pictures taken for needle placement. Patient did have injection of 2 cc of 1% lidocaine, 2 cc of 0.5% Marcaine, and 1.0 cc of Kenalog 40 mg/dL. Completed without difficulty  Pain immediately resolved suggesting accurate placement of the medication.  Advised to call if fevers/chills, erythema, induration, drainage, or persistent bleeding.  Images permanently stored and available for review in the ultrasound unit.  Impression: Technically successful ultrasound guided injection.     Impression and Recommendations:     This case required medical  decision making of moderate complexity.

## 2013-09-20 ENCOUNTER — Ambulatory Visit (INDEPENDENT_AMBULATORY_CARE_PROVIDER_SITE_OTHER): Payer: BC Managed Care – PPO | Admitting: Family Medicine

## 2013-09-20 ENCOUNTER — Encounter: Payer: Self-pay | Admitting: Family Medicine

## 2013-09-20 VITALS — BP 142/80 | HR 68 | Ht 71.0 in | Wt 282.0 lb

## 2013-09-20 DIAGNOSIS — M5010 Cervical disc disorder with radiculopathy, unspecified cervical region: Secondary | ICD-10-CM | POA: Insufficient documentation

## 2013-09-20 DIAGNOSIS — M25511 Pain in right shoulder: Secondary | ICD-10-CM

## 2013-09-20 DIAGNOSIS — M67919 Unspecified disorder of synovium and tendon, unspecified shoulder: Secondary | ICD-10-CM

## 2013-09-20 DIAGNOSIS — M5412 Radiculopathy, cervical region: Secondary | ICD-10-CM

## 2013-09-20 DIAGNOSIS — M7551 Bursitis of right shoulder: Secondary | ICD-10-CM

## 2013-09-20 DIAGNOSIS — M719 Bursopathy, unspecified: Secondary | ICD-10-CM

## 2013-09-20 DIAGNOSIS — M25519 Pain in unspecified shoulder: Secondary | ICD-10-CM

## 2013-09-20 MED ORDER — GABAPENTIN 100 MG PO CAPS: 200.0000 mg | ORAL_CAPSULE | Freq: Every day | ORAL | Status: DC

## 2013-09-20 MED ORDER — DICLOFENAC SODIUM 2 % TD SOLN: TRANSDERMAL | Status: DC

## 2013-09-20 NOTE — Assessment & Plan Note (Signed)
Patient did respond very well to right shoulder bursitis. Patient will continue with home exercises 2 times a week for another 6 weeks. I do believe the patient's pain is secondary to more of cervical radiculopathy at this time. We will focus on that.

## 2013-09-20 NOTE — Assessment & Plan Note (Signed)
Patient's sternoclavicular sprain seems to be improving. Patient still has a capsulitis versus a potential overlying cyst that could be contributing to some of his discomfort but it is getting smaller over the course of time. Patient given a prescription for topical anti-inflammatory that ankle be helpful. Patient will return if it seems to get bigger or if he has any systemic findings. Overall the patient is doing relatively well. This is not a lymph node. As long as it continues to improve with conservative therapy no further imaging is necessary.

## 2013-09-20 NOTE — Progress Notes (Signed)
[facility] [address], [place] [address] Phone: [phone] Subjective:     CC: Right shoulder pain follow up  FIE:[id number] Barry Anthony is a 53 y.o. male coming in with complaint of right-sided shoulder pain as well as some right-sided neck pain. Patient has had this pain for quite some time. Patient states that it seems to be getting worse. Patient was sent for x-rays previously of his neck. I did review his next x-rays. August 18, 2013 cervical neck x-rays do show cervical spondylosis with C5-6 and C6-7 being the most concerning. There is also foraminal narrowing at this level especially on the right. Patient did have some subacromial bursitis and was injected into the shoulder. Patient states that this pain has improved significantly. Patient is still having some mild discomfort mostly over the trapezius on the right side. States that if he moves his neck in certain angle he can ache the pain worse. Denies any weakness no. Patient states though sometimes it is hard to get comfortable at night. States that it seems to be better in the morning. Has tried some over-the-counter medicines with mild improvement.  Patient also had what appeared to be an [place] separation. Patient states that this is getting better slowly as well. Not quite as tender. Denies any fevers or chills or any abnormal weight loss.     Past medical history, social, surgical and family history all reviewed in electronic medical record.   Review of Systems: No headache, visual changes, nausea, vomiting, diarrhea, constipation, dizziness, abdominal pain, skin rash, fevers, chills, night sweats, weight loss, swollen lymph nodes, body aches, joint swelling, muscle aches, chest pain, shortness of breath, mood changes.   Objective Blood pressure 142/80, pulse 68, height 5\' 11"  (1.803 m), weight 282 lb (127.914 kg), SpO2 96.00%.  General: No apparent distress alert and oriented x3 mood  and affect normal, dressed appropriately.  HEENT: Pupils equal, extraocular movements intact  Respiratory: Patient's speak in full sentences and does not appear short of breath  Cardiovascular: No lower extremity edema, non tender, no erythema  Skin: Warm dry intact with no signs of infection or rash on extremities or on axial skeleton.  Abdomen: Soft nontender  Neuro: Cranial nerves II through XII are intact, neurovascularly intact in all extremities with 2+ DTRs and 2+ pulses.  Lymph: No lymphadenopathy of posterior or anterior cervical chain or axillae bilaterally.  Gait normal with good balance and coordination.  MSK:  Non tender with full range of motion and good stability and symmetric strength and tone of elbows, wrist, hip, knee and ankles bilaterally.  Neck: Inspection unremarkable. No palpable stepoffs. Positive Spurling's maneuver right. Full neck range of motion Grip strength and sensation normal in bilateral hands Strength good C4 to T1 distribution No sensory change to C4 to T1 Negative Hoffman sign bilaterally Reflexes normal  Shoulder: Right Inspection reveals no abnormalities, atrophy or asymmetry. Palpation is normal with no tenderness over AC joint or bicipital groove. ROM is full in all planes passively. Rotator cuff strength normal throughout. No signs of impingement Speeds and Yergason's tests normal. No labral pathology noted with negative Obrien's, negative clunk and good stability. Normal scapular function observed. No painful arc and no drop arm sign. No apprehension sign On exam the patient also does have some mild abnormality of the  joints with it seems to be anterior compared to the contralateral side but improved from previous exam Contralateral shoulder unremarkable  MSK US performed of:  Right This study was ordered, performed, and interpreted by Charlann Boxer D.O.  Shoulder:   Supraspinatus:  Appears normal on long and transverse views, Bursal  bulge seen with shoulder abduction on impingement view. Infraspinatus:  Appears normal on long and transverse views. Significant increase in Doppler flow Subscapularis:  Appears normal on long and transverse views. Positive bursa Teres Minor:  Appears normal on long and transverse views. AC joint:  Capsule undistended, no geyser sign. Glenohumeral Joint:  Appears normal without effusion. Glenoid Labrum:  Intact without visualized tears. Biceps Tendon:  Appears normal on long and transverse views, no fraying of tendon, tendon located in intertubercular groove, no subluxation with shoulder internal or external rotation. [place] joint shows mild separation as well as what appears to be a post traumatic cyst overlying area just lateral to joint  Impression: Resolve subacromial bursitis improving [place] capsulitis.      Impression and Recommendations:     This case required medical decision making of moderate complexity.

## 2013-09-20 NOTE — Patient Instructions (Signed)
Good to see you Barry Anthony the meloxicam daily Ice is your friend Gabapentin at night 100mg  first week then 200mg  at night thereafte Physical therapy will be calling you.  Pennsaid topically when you need it.  Love to see you again in 3-4 weeks.

## 2013-09-20 NOTE — Assessment & Plan Note (Addendum)
I believe the patient's pain is secondary to cervical radiculopathy. X-rays ordered today. Patient will be started on a very low dose gabapentin at night I think would be helpful. Patient will also try the topical medication as needed. Patient will be sent to formal physical therapy and will try a home traction unit that I think would be helpful. Patient and will come back and see me again in 3-4 weeks after status having a better routine and neck exercises. Patient continues to have difficulty remaining consider further imaging such as MRI.  Spent greater than 25 minutes with patient face-to-face and had greater than 50% of counseling including as described above in assessment and plan.

## 2013-10-07 ENCOUNTER — Telehealth: Payer: Self-pay | Admitting: Family Medicine

## 2013-10-07 DIAGNOSIS — I10 Essential (primary) hypertension: Secondary | ICD-10-CM

## 2013-10-07 MED ORDER — NEBIVOLOL HCL 10 MG PO TABS: 10.0000 mg | ORAL_TABLET | Freq: Every day | ORAL | Status: DC

## 2013-10-07 NOTE — Telephone Encounter (Signed)
Caller name: Armanii  Call back [phone] Pharmacy: [facility] [address]  Reason for call:  Pt wants a refill on Rx nebivolol (BYSTOLIC) 10 MG tablet

## 2013-10-10 ENCOUNTER — Telehealth: Payer: Self-pay | Admitting: Family Medicine

## 2013-10-10 MED ORDER — CARVEDILOL 3.125 MG PO TABS: 3.1250 mg | ORAL_TABLET | Freq: Two times a day (BID) | ORAL | Status: DC

## 2013-10-10 NOTE — Telephone Encounter (Signed)
Patient informed and states he found a $50 off coupon online and was going to try that. But he wanted the Carvedilol still sent to pharmacy incase it doesn't work.  Carvedilol sent and pt informed that if he does switch he needs to call back and schedule and appt for BP check (w/nurse) in 3-4 weeks.  Pt stated he would call and leave a message tomorrow with what medication he ends up getting

## 2013-10-10 NOTE — Telephone Encounter (Signed)
OK to d/c Bystolic and start Carvedilol 3.125 mg tabs 1 tab po bid but then will need a bp check in 3-4 weeks. Disp #60 with 1 rf til we check bp

## 2013-10-10 NOTE — Telephone Encounter (Signed)
Pt states when he went to pick up his rx with his new insurance the rx was $100.00, pt would like to know if he can get an rx cheaper called in

## 2013-10-10 NOTE — Addendum Note (Signed)
Addended by: Varney Daily on: 10/10/2013 04:52 PM   Modules accepted: Orders

## 2013-10-10 NOTE — Telephone Encounter (Signed)
Error/gd °

## 2013-10-17 ENCOUNTER — Other Ambulatory Visit: Payer: Self-pay | Admitting: Family Medicine

## 2013-10-18 ENCOUNTER — Ambulatory Visit: Payer: BC Managed Care – PPO | Admitting: Family Medicine

## 2013-10-18 NOTE — Telephone Encounter (Signed)
Refill done.  

## 2013-10-20 ENCOUNTER — Ambulatory Visit (INDEPENDENT_AMBULATORY_CARE_PROVIDER_SITE_OTHER): Payer: BC Managed Care – PPO | Admitting: Family Medicine

## 2013-10-20 ENCOUNTER — Encounter: Payer: Self-pay | Admitting: Family Medicine

## 2013-10-20 VITALS — BP 132/72 | HR 53 | Ht 71.0 in | Wt 290.0 lb

## 2013-10-20 DIAGNOSIS — M25511 Pain in right shoulder: Secondary | ICD-10-CM

## 2013-10-20 DIAGNOSIS — M19019 Primary osteoarthritis, unspecified shoulder: Secondary | ICD-10-CM

## 2013-10-20 DIAGNOSIS — M5010 Cervical disc disorder with radiculopathy, unspecified cervical region: Secondary | ICD-10-CM

## 2013-10-20 DIAGNOSIS — M129 Arthropathy, unspecified: Secondary | ICD-10-CM

## 2013-10-20 DIAGNOSIS — M7551 Bursitis of right shoulder: Secondary | ICD-10-CM

## 2013-10-20 MED ORDER — MELOXICAM 15 MG PO TABS: 15.0000 mg | ORAL_TABLET | Freq: Every day | ORAL | Status: DC

## 2013-10-20 NOTE — Progress Notes (Signed)
[facility] [address], [place] [address] Phone: [phone] Subjective:     CC: Right shoulder pain follow up  ELF:[id number] Barry Anthony is a 53 y.o. male coming in with complaint of right-sided shoulder pain as well as some right-sided neck pain. Patient has had this pain for quite some time. Patient states that it seems to be getting worse. Patient was sent for x-rays previously of his neck. I did review his next x-rays. August 18, 2013 cervical neck x-rays do show cervical spondylosis with C5-6 and C6-7 being the most concerning. There is also foraminal narrowing at this level especially on the right. Patient did have some subacromial bursitis and was injected into the shoulder.patient states at this time there is days when he can go through old and not even think about his shoulder. States that there is some movement such as reaching across his body can be minorly painful. Otherwise patient states that he is approximately 75-80% better. Denies any radiation of the arm or any new symptoms. Overall patient is very happy with the results. Continuing with conservative therapy including the home exercises and icing. Patient does like taking of meloxicam as needed and uses the topical anti-inflammatories sparingly   Patient also had what appeared to be an [place] separation. Patient states it is not painful at all anymore.     Past medical history, social, surgical and family history all reviewed in electronic medical record.   Review of Systems: No headache, visual changes, nausea, vomiting, diarrhea, constipation, dizziness, abdominal pain, skin rash, fevers, chills, night sweats, weight loss, swollen lymph nodes, body aches, joint swelling, muscle aches, chest pain, shortness of breath, mood changes.   Objective Blood pressure 132/72, pulse 53, height 5\' 11"  (1.803 m), weight 290 lb (131.543 kg), SpO2 98.00%.  General: No apparent distress alert and  oriented x3 mood and affect normal, dressed appropriately.  HEENT: Pupils equal, extraocular movements intact  Respiratory: Patient's speak in full sentences and does not appear short of breath  Cardiovascular: No lower extremity edema, non tender, no erythema  Skin: Warm dry intact with no signs of infection or rash on extremities or on axial skeleton.  Abdomen: Soft nontender  Neuro: Cranial nerves II through XII are intact, neurovascularly intact in all extremities with 2+ DTRs and 2+ pulses.  Lymph: No lymphadenopathy of posterior or anterior cervical chain or axillae bilaterally.  Gait normal with good balance and coordination.  MSK:  Non tender with full range of motion and good stability and symmetric strength and tone of elbows, wrist, hip, knee and ankles bilaterally.  Neck: Inspection unremarkable. No palpable stepoffs. Still slightly positive Spurling's maneuver right but improved Full neck range of motion Grip strength and sensation normal in bilateral hands Strength good C4 to T1 distribution No sensory change to C4 to T1 Negative Hoffman sign bilaterally Reflexes normal  Shoulder: Right Inspection reveals no abnormalities, atrophy or asymmetry. Palpation is normal with no tenderness over AC joint or bicipital groove. ROM is full in all planes passively. Rotator cuff strength normal throughout. No signs of impingement Speeds and Yergason's tests normal. No labral pathology noted with negative Obrien's, negative clunk and good stability. Normal scapular function observed. No painful arc and no drop arm sign. No apprehension sign On exam the patient also does have some mild abnormality of the [place] joints with it seems to be anterior compared to the contralateral side but nontender and stable Contralateral shoulder unremarkable  Impression and Recommendations:     This case required medical decision making of moderate complexity.

## 2013-10-20 NOTE — Assessment & Plan Note (Signed)
Patient is doing remarkably well after the injection. Patient has full range of motion with some mild pain with cross over this likely secondary to the acromioclavicular joint. Patient is if he has any worsening symptoms he'll come back and see me again and can consider doing an injection in the a.c. joint.

## 2013-10-20 NOTE — Patient Instructions (Signed)
Good to see you Ice when you need it at the end of the day Pennsaid when acting up Meloxicam as needed Exercises at least 2 times a week.  Continue the posture exercises If pain worsens on top of shoulder can do AC injection.  Otherwise see me when you need me.

## 2013-10-20 NOTE — Assessment & Plan Note (Signed)
I do believe the patient does have some cervical radiculopathy that is actually giving him some of his discomfort. We will continue to monitor closely. Encourage him to continue to work on the postural exercises as well as the home exercises. We also discussed the icing and the over-the-counter medicines I think will be beneficial. The patient continues to do relatively well we can follow up on an as-needed basis. Patient will come in for any type of exacerbation. Patient encouraged to continue to try to titrate down on any medications of possible a refill for meloxicam was given today.

## 2013-10-20 NOTE — Assessment & Plan Note (Signed)
Does have enlargement on the right side that likely secondary to separation [place] but is no longer tender. I do not see any differences and growing in size at this time. Patient is that he'll return if anything does change dramatically.

## 2013-11-27 ENCOUNTER — Other Ambulatory Visit: Payer: Self-pay | Admitting: Family Medicine

## 2013-11-28 NOTE — Telephone Encounter (Signed)
Follow-up Instructions  08.13.15 AVS   Return in about 6 months (around 02/18/2014) for annual exam, lipid, renal, cbc, tsh, hepatic, hgba1c prior.     Rx request to pharmacy/[facility]

## 2013-12-09 ENCOUNTER — Other Ambulatory Visit: Payer: Self-pay | Admitting: Internal Medicine

## 2014-02-07 ENCOUNTER — Encounter: Payer: Self-pay | Admitting: Family Medicine

## 2014-02-07 ENCOUNTER — Ambulatory Visit (INDEPENDENT_AMBULATORY_CARE_PROVIDER_SITE_OTHER): Payer: 59 | Admitting: Family Medicine

## 2014-02-07 VITALS — BP 143/72 | HR 54 | Temp 97.9°F | Ht 70.5 in | Wt 284.6 lb

## 2014-02-07 DIAGNOSIS — E669 Obesity, unspecified: Secondary | ICD-10-CM

## 2014-02-07 DIAGNOSIS — M7551 Bursitis of right shoulder: Secondary | ICD-10-CM

## 2014-02-07 DIAGNOSIS — Z1211 Encounter for screening for malignant neoplasm of colon: Secondary | ICD-10-CM

## 2014-02-07 DIAGNOSIS — E782 Mixed hyperlipidemia: Secondary | ICD-10-CM

## 2014-02-07 DIAGNOSIS — I10 Essential (primary) hypertension: Secondary | ICD-10-CM

## 2014-02-07 DIAGNOSIS — E119 Type 2 diabetes mellitus without complications: Secondary | ICD-10-CM

## 2014-02-07 DIAGNOSIS — Z23 Encounter for immunization: Secondary | ICD-10-CM

## 2014-02-07 DIAGNOSIS — Z0000 Encounter for general adult medical examination without abnormal findings: Secondary | ICD-10-CM

## 2014-02-07 DIAGNOSIS — E1169 Type 2 diabetes mellitus with other specified complication: Secondary | ICD-10-CM

## 2014-02-07 LAB — COMPREHENSIVE METABOLIC PANEL
ALK PHOS: 47 U/L (ref 39–117)
ALT: 20 U/L (ref 0–53)
AST: 27 U/L (ref 0–37)
Albumin: 4.5 g/dL (ref 3.5–5.2)
BUN: 16 mg/dL (ref 6–23)
CALCIUM: 9.5 mg/dL (ref 8.4–10.5)
CHLORIDE: 102 meq/L (ref 96–112)
CO2: 27 mEq/L (ref 19–32)
Creatinine, Ser: 1.2 mg/dL (ref 0.40–1.50)
GFR: 67.73 mL/min (ref >60.00)
Glucose, Bld: 96 mg/dL (ref 70–99)
Potassium: 4 mEq/L (ref 3.5–5.1)
SODIUM: 136 meq/L (ref 135–145)
Total Bilirubin: 0.8 mg/dL (ref 0.2–1.2)
Total Protein: 7.2 g/dL (ref 6.0–8.3)

## 2014-02-07 LAB — CBC
HEMATOCRIT: 44.5 % (ref 39.0–52.0)
HEMOGLOBIN: 15.3 g/dL (ref 13.0–17.0)
MCHC: 34.3 g/dL (ref 30.0–36.0)
MCV: 85.8 fl (ref 78.0–100.0)
PLATELETS: 260 10*3/uL (ref 150.0–400.0)
RBC: 5.19 Mil/uL (ref 4.22–5.81)
RDW: 13.2 % (ref 11.5–15.5)
WBC: 7.9 10*3/uL (ref 4.0–10.5)

## 2014-02-07 LAB — LIPID PANEL
Cholesterol: 107 mg/dL (ref 0–200)
HDL: 34.5 mg/dL — AB (ref >39.00)
LDL Cholesterol: 49 mg/dL (ref 0–99)
NONHDL: 72.5
TRIGLYCERIDES: 119 mg/dL (ref 0.0–149.0)
Total CHOL/HDL Ratio: 3
VLDL: 23.8 mg/dL (ref 0.0–40.0)

## 2014-02-07 LAB — HEMOGLOBIN A1C: Hgb A1c MFr Bld: 5.9 % (ref 4.6–6.5)

## 2014-02-07 LAB — TSH: TSH: 1.21 u[IU]/mL (ref 0.35–4.50)

## 2014-02-07 MED ORDER — ATORVASTATIN CALCIUM 20 MG PO TABS: 20.0000 mg | ORAL_TABLET | Freq: Every day | ORAL | Status: DC

## 2014-02-07 MED ORDER — LISINOPRIL 40 MG PO TABS: 40.0000 mg | ORAL_TABLET | Freq: Every day | ORAL | Status: DC

## 2014-02-07 NOTE — Progress Notes (Signed)
Pre visit review using our clinic review tool, if applicable. No additional management support is needed unless otherwise documented below in the visit note. 

## 2014-02-07 NOTE — Patient Instructions (Signed)
Preventive Care for Adults A healthy lifestyle and preventive care can promote health and wellness. Preventive health guidelines for men include the following key practices:  A routine yearly physical is a good way to check with your health care provider about your health and preventative screening. It is a chance to share any concerns and updates on your health and to receive a thorough exam.  Visit your dentist for a routine exam and preventative care every 6 months. Brush your teeth twice a day and floss once a day. Good oral hygiene prevents tooth decay and gum disease.  The frequency of eye exams is based on your age, health, family medical history, use of contact lenses, and other factors. Follow your health care provider's recommendations for frequency of eye exams.  Eat a healthy diet. Foods such as vegetables, fruits, whole grains, low-fat dairy products, and lean protein foods contain the nutrients you need without too many calories. Decrease your intake of foods high in solid fats, added sugars, and salt. Eat the right amount of calories for you.Get information about a proper diet from your health care provider, if necessary.  Regular physical exercise is one of the most important things you can do for your health. Most adults should get at least 150 minutes of moderate-intensity exercise (any activity that increases your heart rate and causes you to sweat) each week. In addition, most adults need muscle-strengthening exercises on 2 or more days a week.  Maintain a healthy weight. The body mass index (BMI) is a screening tool to identify possible weight problems. It provides an estimate of body fat based on height and weight. Your health care provider can find your BMI and can help you achieve or maintain a healthy weight.For adults 20 years and older:  A BMI below 18.5 is considered underweight.  A BMI of 18.5 to 24.9 is normal.  A BMI of 25 to 29.9 is considered overweight.  A BMI  of 30 and above is considered obese.  Maintain normal blood lipids and cholesterol levels by exercising and minimizing your intake of saturated fat. Eat a balanced diet with plenty of fruit and vegetables. Blood tests for lipids and cholesterol should begin at age 50 and be repeated every 5 years. If your lipid or cholesterol levels are high, you are over 50, or you are at high risk for heart disease, you may need your cholesterol levels checked more frequently.Ongoing high lipid and cholesterol levels should be treated with medicines if diet and exercise are not working.  If you smoke, find out from your health care provider how to quit. If you do not use tobacco, do not start.  Lung cancer screening is recommended for adults aged 73-80 years who are at high risk for developing lung cancer because of a history of smoking. A yearly low-dose CT scan of the lungs is recommended for people who have at least a 30-pack-year history of smoking and are a current smoker or have quit within the past 15 years. A pack year of smoking is smoking an average of 1 pack of cigarettes a day for 1 year (for example: 1 pack a day for 30 years or 2 packs a day for 15 years). Yearly screening should continue until the smoker has stopped smoking for at least 15 years. Yearly screening should be stopped for people who develop a health problem that would prevent them from having lung cancer treatment.  If you choose to drink alcohol, do not have more than  2 drinks per day. One drink is considered to be 12 ounces (355 mL) of beer, 5 ounces (148 mL) of wine, or 1.5 ounces (44 mL) of liquor.  Avoid use of street drugs. Do not share needles with anyone. Ask for help if you need support or instructions about stopping the use of drugs.  High blood pressure causes heart disease and increases the risk of stroke. Your blood pressure should be checked at least every 1-2 years. Ongoing high blood pressure should be treated with  medicines, if weight loss and exercise are not effective.  If you are 45-79 years old, ask your health care provider if you should take aspirin to prevent heart disease.  Diabetes screening involves taking a blood sample to check your fasting blood sugar level. This should be done once every 3 years, after age 45, if you are within normal weight and without risk factors for diabetes. Testing should be considered at a younger age or be carried out more frequently if you are overweight and have at least 1 risk factor for diabetes.  Colorectal cancer can be detected and often prevented. Most routine colorectal cancer screening begins at the age of 50 and continues through age 75. However, your health care provider may recommend screening at an earlier age if you have risk factors for colon cancer. On a yearly basis, your health care provider may provide home test kits to check for hidden blood in the stool. Use of a small camera at the end of a tube to directly examine the colon (sigmoidoscopy or colonoscopy) can detect the earliest forms of colorectal cancer. Talk to your health care provider about this at age 50, when routine screening begins. Direct exam of the colon should be repeated every 5-10 years through age 75, unless early forms of precancerous polyps or small growths are found.  People who are at an increased risk for hepatitis B should be screened for this virus. You are considered at high risk for hepatitis B if:  You were born in a country where hepatitis B occurs often. Talk with your health care provider about which countries are considered high risk.  Your parents were born in a high-risk country and you have not received a shot to protect against hepatitis B (hepatitis B vaccine).  You have HIV or AIDS.  You use needles to inject street drugs.  You live with, or have sex with, someone who has hepatitis B.  You are a man who has sex with other men (MSM).  You get hemodialysis  treatment.  You take certain medicines for conditions such as cancer, organ transplantation, and autoimmune conditions.  Hepatitis C blood testing is recommended for all people born from 1945 through 1965 and any individual with known risks for hepatitis C.  Practice safe sex. Use condoms and avoid high-risk sexual practices to reduce the spread of sexually transmitted infections (STIs). STIs include gonorrhea, chlamydia, syphilis, trichomonas, herpes, HPV, and human immunodeficiency virus (HIV). Herpes, HIV, and HPV are viral illnesses that have no cure. They can result in disability, cancer, and death.  If you are at risk of being infected with HIV, it is recommended that you take a prescription medicine daily to prevent HIV infection. This is called preexposure prophylaxis (PrEP). You are considered at risk if:  You are a man who has sex with other men (MSM) and have other risk factors.  You are a heterosexual man, are sexually active, and are at increased risk for HIV infection.    You take drugs by injection.  You are sexually active with a partner who has HIV.  Talk with your health care provider about whether you are at high risk of being infected with HIV. If you choose to begin PrEP, you should first be tested for HIV. You should then be tested every 3 months for as long as you are taking PrEP.  A one-time screening for abdominal aortic aneurysm (AAA) and surgical repair of large AAAs by ultrasound are recommended for men ages 32 to 67 years who are current or former smokers.  Healthy men should no longer receive prostate-specific antigen (PSA) blood tests as part of routine cancer screening. Talk with your health care provider about prostate cancer screening.  Testicular cancer screening is not recommended for adult males who have no symptoms. Screening includes self-exam, a health care provider exam, and other screening tests. Consult with your health care provider about any symptoms  you have or any concerns you have about testicular cancer.  Use sunscreen. Apply sunscreen liberally and repeatedly throughout the day. You should seek shade when your shadow is shorter than you. Protect yourself by wearing long sleeves, pants, a wide-brimmed hat, and sunglasses year round, whenever you are outdoors.  Once a month, do a whole-body skin exam, using a mirror to look at the skin on your back. Tell your health care provider about new moles, moles that have irregular borders, moles that are larger than a pencil eraser, or moles that have changed in shape or color.  Stay current with required vaccines (immunizations).  Influenza vaccine. All adults should be immunized every year.  Tetanus, diphtheria, and acellular pertussis (Td, Tdap) vaccine. An adult who has not previously received Tdap or who does not know his vaccine status should receive 1 dose of Tdap. This initial dose should be followed by tetanus and diphtheria toxoids (Td) booster doses every 10 years. Adults with an unknown or incomplete history of completing a 3-dose immunization series with Td-containing vaccines should begin or complete a primary immunization series including a Tdap dose. Adults should receive a Td booster every 10 years.  Varicella vaccine. An adult without evidence of immunity to varicella should receive 2 doses or a second dose if he has previously received 1 dose.  Human papillomavirus (HPV) vaccine. Males aged 68-21 years who have not received the vaccine previously should receive the 3-dose series. Males aged 22-26 years may be immunized. Immunization is recommended through the age of 6 years for any male who has sex with males and did not get any or all doses earlier. Immunization is recommended for any person with an immunocompromised condition through the age of 49 years if he did not get any or all doses earlier. During the 3-dose series, the second dose should be obtained 4-8 weeks after the first  dose. The third dose should be obtained 24 weeks after the first dose and 16 weeks after the second dose.  Zoster vaccine. One dose is recommended for adults aged 50 years or older unless certain conditions are present.  Measles, mumps, and rubella (MMR) vaccine. Adults born before [birth year] generally are considered immune to measles and mumps. Adults born in [birth year] or later should have 1 or more doses of MMR vaccine unless there is a contraindication to the vaccine or there is laboratory evidence of immunity to each of the three diseases. A routine second dose of MMR vaccine should be obtained at least 28 days after the first dose for students attending postsecondary  schools, health care workers, or international travelers. People who received inactivated measles vaccine or an unknown type of measles vaccine during 1963-1967 should receive 2 doses of MMR vaccine. People who received inactivated mumps vaccine or an unknown type of mumps vaccine before 1979 and are at high risk for mumps infection should consider immunization with 2 doses of MMR vaccine. Unvaccinated health care workers born before 1957 who lack laboratory evidence of measles, mumps, or rubella immunity or laboratory confirmation of disease should consider measles and mumps immunization with 2 doses of MMR vaccine or rubella immunization with 1 dose of MMR vaccine.  Pneumococcal 13-valent conjugate (PCV13) vaccine. When indicated, a person who is uncertain of his immunization history and has no record of immunization should receive the PCV13 vaccine. An adult aged 19 years or older who has certain medical conditions and has not been previously immunized should receive 1 dose of PCV13 vaccine. This PCV13 should be followed with a dose of pneumococcal polysaccharide (PPSV23) vaccine. The PPSV23 vaccine dose should be obtained at least 8 weeks after the dose of PCV13 vaccine. An adult aged 19 years or older who has certain medical conditions and  previously received 1 or more doses of PPSV23 vaccine should receive 1 dose of PCV13. The PCV13 vaccine dose should be obtained 1 or more years after the last PPSV23 vaccine dose.  Pneumococcal polysaccharide (PPSV23) vaccine. When PCV13 is also indicated, PCV13 should be obtained first. All adults aged 65 years and older should be immunized. An adult younger than age 65 years who has certain medical conditions should be immunized. Any person who resides in a nursing home or long-term care facility should be immunized. An adult smoker should be immunized. People with an immunocompromised condition and certain other conditions should receive both PCV13 and PPSV23 vaccines. People with human immunodeficiency virus (HIV) infection should be immunized as soon as possible after diagnosis. Immunization during chemotherapy or radiation therapy should be avoided. Routine use of PPSV23 vaccine is not recommended for American Indians, [place] Natives, or people younger than 65 years unless there are medical conditions that require PPSV23 vaccine. When indicated, people who have unknown immunization and have no record of immunization should receive PPSV23 vaccine. One-time revaccination 5 years after the first dose of PPSV23 is recommended for people aged 19-64 years who have chronic kidney failure, nephrotic syndrome, asplenia, or immunocompromised conditions. People who received 1-2 doses of PPSV23 before age 65 years should receive another dose of PPSV23 vaccine at age 65 years or later if at least 5 years have passed since the previous dose. Doses of PPSV23 are not needed for people immunized with PPSV23 at or after age 65 years.  Meningococcal vaccine. Adults with asplenia or persistent complement component deficiencies should receive 2 doses of quadrivalent meningococcal conjugate (MenACWY-D) vaccine. The doses should be obtained at least 2 months apart. Microbiologists working with certain meningococcal bacteria,  military recruits, people at risk during an outbreak, and people who travel to or live in countries with a high rate of meningitis should be immunized. A first-year college student up through age 21 years who is living in a residence hall should receive a dose if he did not receive a dose on or after his 16th birthday. Adults who have certain high-risk conditions should receive one or more doses of vaccine.  Hepatitis A vaccine. Adults who wish to be protected from this disease, have certain high-risk conditions, work with hepatitis A-infected animals, work in hepatitis A research labs, or   travel to or work in countries with a high rate of hepatitis A should be immunized. Adults who were previously unvaccinated and who anticipate close contact with an international adoptee during the first 60 days after arrival in the [place] from a country with a high rate of hepatitis A should be immunized.  Hepatitis B vaccine. Adults should be immunized if they wish to be protected from this disease, have certain high-risk conditions, may be exposed to blood or other infectious body fluids, are household contacts or sex partners of hepatitis B positive people, are clients or workers in certain care facilities, or travel to or work in countries with a high rate of hepatitis B.  Haemophilus influenzae type b (Hib) vaccine. A previously unvaccinated person with asplenia or sickle cell disease or having a scheduled splenectomy should receive 1 dose of Hib vaccine. Regardless of previous immunization, a recipient of a hematopoietic stem cell transplant should receive a 3-dose series 6-12 months after his successful transplant. Hib vaccine is not recommended for adults with HIV infection. Preventive Service / Frequency Ages 52 to 17  Blood pressure check.** / Every 1 to 2 years.  Lipid and cholesterol check.** / Every 5 years beginning at age 69.  Hepatitis C blood test.** / For any individual with known risks for  hepatitis C.  Skin self-exam. / Monthly.  Influenza vaccine. / Every year.  Tetanus, diphtheria, and acellular pertussis (Tdap, Td) vaccine.** / Consult your health care provider. 1 dose of Td every 10 years.  Varicella vaccine.** / Consult your health care provider.  HPV vaccine. / 3 doses over 6 months, if 72 or younger.  Measles, mumps, rubella (MMR) vaccine.** / You need at least 1 dose of MMR if you were born in 1957 or later. You may also need a second dose.  Pneumococcal 13-valent conjugate (PCV13) vaccine.** / Consult your health care provider.  Pneumococcal polysaccharide (PPSV23) vaccine.** / 1 to 2 doses if you smoke cigarettes or if you have certain conditions.  Meningococcal vaccine.** / 1 dose if you are age 35 to 60 years and a Market researcher living in a residence hall, or have one of several medical conditions. You may also need additional booster doses.  Hepatitis A vaccine.** / Consult your health care provider.  Hepatitis B vaccine.** / Consult your health care provider.  Haemophilus influenzae type b (Hib) vaccine.** / Consult your health care provider. Ages 35 to 8  Blood pressure check.** / Every 1 to 2 years.  Lipid and cholesterol check.** / Every 5 years beginning at age 57.  Lung cancer screening. / Every year if you are aged 44-80 years and have a 30-pack-year history of smoking and currently smoke or have quit within the past 15 years. Yearly screening is stopped once you have quit smoking for at least 15 years or develop a health problem that would prevent you from having lung cancer treatment.  Fecal occult blood test (FOBT) of stool. / Every year beginning at age 55 and continuing until age 73. You may not have to do this test if you get a colonoscopy every 10 years.  Flexible sigmoidoscopy** or colonoscopy.** / Every 5 years for a flexible sigmoidoscopy or every 10 years for a colonoscopy beginning at age 28 and continuing until age  1.  Hepatitis C blood test.** / For all people born from [birth year] through 1965 and any individual with known risks for hepatitis C.  Skin self-exam. / Monthly.  Influenza vaccine. / Every  year.  Tetanus, diphtheria, and acellular pertussis (Tdap/Td) vaccine.** / Consult your health care provider. 1 dose of Td every 10 years.  Varicella vaccine.** / Consult your health care provider.  Zoster vaccine.** / 1 dose for adults aged 53 years or older.  Measles, mumps, rubella (MMR) vaccine.** / You need at least 1 dose of MMR if you were born in 1957 or later. You may also need a second dose.  Pneumococcal 13-valent conjugate (PCV13) vaccine.** / Consult your health care provider.  Pneumococcal polysaccharide (PPSV23) vaccine.** / 1 to 2 doses if you smoke cigarettes or if you have certain conditions.  Meningococcal vaccine.** / Consult your health care provider.  Hepatitis A vaccine.** / Consult your health care provider.  Hepatitis B vaccine.** / Consult your health care provider.  Haemophilus influenzae type b (Hib) vaccine.** / Consult your health care provider. Ages 77 and over  Blood pressure check.** / Every 1 to 2 years.  Lipid and cholesterol check.**/ Every 5 years beginning at age 85.  Lung cancer screening. / Every year if you are aged 55-80 years and have a 30-pack-year history of smoking and currently smoke or have quit within the past 15 years. Yearly screening is stopped once you have quit smoking for at least 15 years or develop a health problem that would prevent you from having lung cancer treatment.  Fecal occult blood test (FOBT) of stool. / Every year beginning at age 33 and continuing until age 11. You may not have to do this test if you get a colonoscopy every 10 years.  Flexible sigmoidoscopy** or colonoscopy.** / Every 5 years for a flexible sigmoidoscopy or every 10 years for a colonoscopy beginning at age 28 and continuing until age 73.  Hepatitis C blood  test.** / For all people born from [birth year] through 1965 and any individual with known risks for hepatitis C.  Abdominal aortic aneurysm (AAA) screening.** / A one-time screening for ages 50 to 27 years who are current or former smokers.  Skin self-exam. / Monthly.  Influenza vaccine. / Every year.  Tetanus, diphtheria, and acellular pertussis (Tdap/Td) vaccine.** / 1 dose of Td every 10 years.  Varicella vaccine.** / Consult your health care provider.  Zoster vaccine.** / 1 dose for adults aged 34 years or older.  Pneumococcal 13-valent conjugate (PCV13) vaccine.** / Consult your health care provider.  Pneumococcal polysaccharide (PPSV23) vaccine.** / 1 dose for all adults aged 63 years and older.  Meningococcal vaccine.** / Consult your health care provider.  Hepatitis A vaccine.** / Consult your health care provider.  Hepatitis B vaccine.** / Consult your health care provider.  Haemophilus influenzae type b (Hib) vaccine.** / Consult your health care provider. **Family history and personal history of risk and conditions may change your health care provider's recommendations. Document Released: 02/18/2001 Document Revised: 12/28/2012 Document Reviewed: 05/20/2010 [facility] Patient Information 2015 [place], [place]. This information is not intended to replace advice given to you by your health care provider. Make sure you discuss any questions you have with your health care provider.

## 2014-02-08 ENCOUNTER — Encounter: Payer: Self-pay | Admitting: Internal Medicine

## 2014-02-08 ENCOUNTER — Other Ambulatory Visit: Payer: Self-pay | Admitting: Family Medicine

## 2014-02-12 ENCOUNTER — Encounter: Payer: Self-pay | Admitting: Family Medicine

## 2014-02-12 DIAGNOSIS — Z0000 Encounter for general adult medical examination without abnormal findings: Secondary | ICD-10-CM | POA: Insufficient documentation

## 2014-02-12 HISTORY — DX: Encounter for general adult medical examination without abnormal findings: Z00.00

## 2014-02-12 NOTE — Assessment & Plan Note (Signed)
hgba1c acceptable, minimize simple carbs. Increase exercise as tolerated. Continue current meds 

## 2014-02-12 NOTE — Assessment & Plan Note (Signed)
Patient encouraged to maintain heart healthy diet, regular exercise, adequate sleep. Consider daily probiotics. Take medications as prescribed. Labs run and reviewed today. Referred for screening colonoscopy

## 2014-02-12 NOTE — Assessment & Plan Note (Signed)
Encouraged DASH diet, decrease po intake and increase exercise as tolerated. Needs 7-8 hours of sleep nightly. Avoid trans fats, eat small, frequent meals every 4-5 hours with lean proteins, complex carbs and healthy fats. Minimize simple carbs 

## 2014-02-12 NOTE — Assessment & Plan Note (Signed)
Tolerating statin, encouraged heart healthy diet, avoid trans fats, minimize simple carbs and saturated fats. Increase exercise as tolerated 

## 2014-02-12 NOTE — Progress Notes (Signed)
Barry Anthony  [id number] November 09, 1960 02/12/2014      Progress Note-Follow Up  Subjective  Chief Complaint  Chief Complaint  Patient presents with  . Annual Exam    fasting    HPI  Patient is a 54 y.o. male in today for routine medical care. Patient in today for annual exam. Feeling fairly well. Is trying to exercise regularly and has cut down to 1200 kcal a day. Is trying to eat a balance diet with lean proteins and complex carbs. His right shoulder is better with treatment from sports medicine. Denies CP/palp/SOB/HA/congestion/fevers/GI or GU c/o. Taking meds as prescribed  Past Medical History  Diagnosis Date  . Allergic rhinitis   . GERD (gastroesophageal reflux disease)   . Hyperlipidemia   . Hypertension   . History of chicken pox   . Bilateral hydrocele 12/08/2012  . Preventative health care 02/12/2014    Past Surgical History  Procedure Laterality Date  . Knee surgery  1981    right knee  . Tonsillectomy  1969    Family History  Problem Relation Age of Onset  . Thyroid cancer Father   . Hypertension Father   . Coronary artery disease Father   . Hyperlipidemia Father   . Alcohol abuse Father     smoker  . Heart disease Father   . Other      no colon cancer, no prostate canceer  . Hypothyroidism Mother   . Dementia Maternal Aunt   . Dementia Maternal Uncle   . Cancer Maternal Grandmother     breast  . Heart disease Paternal Grandmother     heart disease    History   Social History  . Marital Status: Married    Spouse Name: N/A    Number of Children: N/A  . Years of Education: N/A   Occupational History  . Not on file.   Social History Main Topics  . Smoking status: Never Smoker   . Smokeless tobacco: Never Used  . Alcohol Use: Yes     Comment: 2 drinks / week  . Drug Use: No  . Sexual Activity: Yes     Comment: lives with wife, works as a Public house manager at [employer]. no major dietary restrictions   Other Topics Concern  . Not  on file   Social History Narrative   Occupation:  Parts mgr   Married 16 yrs    2 children  10, 12    Never Smoked    Alcohol use-yes (social)     Current Outpatient Prescriptions on File Prior to Visit  Medication Sig Dispense Refill  . colchicine 0.6 MG tablet Take 1 tablet (0.6 mg total) by mouth 2 (two) times daily as needed. 60 tablet 0  . Diclofenac Sodium 2 % SOLN Apply twice daily 112 g 1  . fluticasone (FLONASE) 50 MCG/ACT nasal spray Place 2 sprays into the nose daily as needed.    . gabapentin (NEURONTIN) 100 MG capsule TAKE 2 CAPSULES BY MOUTH AT BEDTIME 60 capsule 3  . meloxicam (MOBIC) 15 MG tablet Take 1 tablet (15 mg total) by mouth daily. 90 tablet 1  . PROAIR HFA 108 (90 BASE) MCG/ACT inhaler INHALE 2 PUFFS INTO THE LUNGS DAILY AS NEEDED 8.5 g 3  . sildenafil (REVATIO) 20 MG tablet 2-3 tabs po daily prn ED 40 tablet 2  . [DISCONTINUED] metFORMIN (GLUCOPHAGE) 500 MG tablet Take 500 mg by mouth 1 day or 1 dose.  No current facility-administered medications on file prior to visit.    No Known Allergies  Review of Systems  Review of Systems  Constitutional: Negative for fever, chills and malaise/fatigue.  HENT: Negative for congestion, hearing loss and nosebleeds.   Eyes: Negative for discharge.  Respiratory: Negative for cough, sputum production, shortness of breath and wheezing.   Cardiovascular: Negative for chest pain, palpitations and leg swelling.  Gastrointestinal: Negative for heartburn, nausea, vomiting, abdominal pain, diarrhea, constipation and blood in stool.  Genitourinary: Negative for dysuria, urgency, frequency and hematuria.  Musculoskeletal: Negative for myalgias, back pain and falls.  Skin: Negative for rash.  Neurological: Negative for dizziness, tremors, sensory change, focal weakness, loss of consciousness, weakness and headaches.  Endo/Heme/Allergies: Negative for polydipsia. Does not bruise/bleed easily.  Psychiatric/Behavioral:  Negative for depression and suicidal ideas. The patient is not nervous/anxious and does not have insomnia.     Objective  BP 143/72 mmHg  Pulse 54  Temp(Src) 97.9 F (36.6 C) (Oral)  Ht 5' 10.5" (1.791 m)  Wt 284 lb 9.6 oz (129.094 kg)  BMI 40.25 kg/m2  SpO2 97%  Physical Exam  Physical Exam  Constitutional: He is oriented to person, place, and time and well-developed, well-nourished, and in no distress. No distress.  HENT:  Head: Normocephalic and atraumatic.  Eyes: Conjunctivae are normal.  Neck: Neck supple. No thyromegaly present.  Cardiovascular: Normal rate, regular rhythm and normal heart sounds.   No murmur heard. Pulmonary/Chest: Effort normal and breath sounds normal. No respiratory distress.  Abdominal: He exhibits no distension and no mass. There is no tenderness.  Musculoskeletal: He exhibits no edema.  Neurological: He is alert and oriented to person, place, and time.  Skin: Skin is warm.  Psychiatric: Memory, affect and judgment normal.    Lab Results  Component Value Date   TSH 1.21 02/07/2014   Lab Results  Component Value Date   WBC 7.9 02/07/2014   HGB 15.3 02/07/2014   HCT 44.5 02/07/2014   MCV 85.8 02/07/2014   PLT 260.0 02/07/2014   Lab Results  Component Value Date   CREATININE 1.20 02/07/2014   BUN 16 02/07/2014   NA 136 02/07/2014   K 4.0 02/07/2014   CL 102 02/07/2014   CO2 27 02/07/2014   Lab Results  Component Value Date   ALT 20 02/07/2014   AST 27 02/07/2014   ALKPHOS 47 02/07/2014   BILITOT 0.8 02/07/2014   Lab Results  Component Value Date   CHOL 107 02/07/2014   Lab Results  Component Value Date   HDL 34.50* 02/07/2014   Lab Results  Component Value Date   LDLCALC 49 02/07/2014   Lab Results  Component Value Date   TRIG 119.0 02/07/2014   Lab Results  Component Value Date   CHOLHDL 3 02/07/2014     Assessment & Plan  Essential hypertension Did not get adequate control with Carvedilol will try  switching back to Bystolic   Bursitis of right shoulder His pain is much improved after seeing sports medicine   Obesity Encouraged DASH diet, decrease po intake and increase exercise as tolerated. Needs 7-8 hours of sleep nightly. Avoid trans fats, eat small, frequent meals every 4-5 hours with lean proteins, complex carbs and healthy fats. Minimize simple carbs   Diabetes mellitus type 2 in obese hgba1c acceptable, minimize simple carbs. Increase exercise as tolerated. Continue current meds   Hyperlipidemia, mixed Tolerating statin, encouraged heart healthy diet, avoid trans fats, minimize simple carbs and saturated fats.  Increase exercise as tolerated   Preventative health care Patient encouraged to maintain heart healthy diet, regular exercise, adequate sleep. Consider daily probiotics. Take medications as prescribed. Labs run and reviewed today. Referred for screening colonoscopy

## 2014-02-12 NOTE — Assessment & Plan Note (Signed)
His pain is much improved after seeing sports medicine

## 2014-02-12 NOTE — Assessment & Plan Note (Signed)
Did not get adequate control with Carvedilol will try switching back to [facility]

## 2014-03-01 ENCOUNTER — Encounter: Payer: Self-pay | Admitting: Family Medicine

## 2014-03-06 ENCOUNTER — Ambulatory Visit: Payer: 59 | Admitting: Psychology

## 2014-03-14 ENCOUNTER — Other Ambulatory Visit: Payer: Self-pay | Admitting: Family Medicine

## 2014-03-14 MED ORDER — FLUTICASONE PROPIONATE 50 MCG/ACT NA SUSP: 2.0000 | Freq: Every day | NASAL | Status: AC | PRN

## 2014-03-17 ENCOUNTER — Ambulatory Visit (INDEPENDENT_AMBULATORY_CARE_PROVIDER_SITE_OTHER): Payer: 59 | Admitting: Psychology

## 2014-03-17 DIAGNOSIS — F4323 Adjustment disorder with mixed anxiety and depressed mood: Secondary | ICD-10-CM

## 2014-03-27 ENCOUNTER — Ambulatory Visit (AMBULATORY_SURGERY_CENTER): Payer: Self-pay | Admitting: *Deleted

## 2014-03-27 VITALS — Ht 71.0 in | Wt 267.2 lb

## 2014-03-27 DIAGNOSIS — Z1211 Encounter for screening for malignant neoplasm of colon: Secondary | ICD-10-CM

## 2014-03-27 MED ORDER — MOVIPREP 100 G PO SOLR: 1.0000 | Freq: Once | ORAL | Status: DC

## 2014-03-27 NOTE — Progress Notes (Signed)
No egg or soy allergy No home 02 use No diet pills No issues with past sedation,no issues with intubation emmi to e mail

## 2014-04-07 ENCOUNTER — Encounter: Payer: 59 | Admitting: Internal Medicine

## 2014-04-10 ENCOUNTER — Ambulatory Visit (INDEPENDENT_AMBULATORY_CARE_PROVIDER_SITE_OTHER): Payer: 59 | Admitting: Psychology

## 2014-04-10 DIAGNOSIS — F4323 Adjustment disorder with mixed anxiety and depressed mood: Secondary | ICD-10-CM | POA: Diagnosis not present

## 2014-04-13 ENCOUNTER — Encounter: Payer: Self-pay | Admitting: Internal Medicine

## 2014-04-21 ENCOUNTER — Encounter: Payer: Self-pay | Admitting: Internal Medicine

## 2014-04-21 ENCOUNTER — Ambulatory Visit (AMBULATORY_SURGERY_CENTER): Payer: 59 | Admitting: Internal Medicine

## 2014-04-21 VITALS — BP 109/66 | HR 56 | Temp 97.9°F | Resp 17

## 2014-04-21 DIAGNOSIS — Z1211 Encounter for screening for malignant neoplasm of colon: Secondary | ICD-10-CM

## 2014-04-21 MED ORDER — SODIUM CHLORIDE 0.9 % IV SOLN: 500.0000 mL | INTRAVENOUS | Status: DC

## 2014-04-21 NOTE — Patient Instructions (Signed)
YOU HAD AN ENDOSCOPIC PROCEDURE TODAY AT [facility]:   Refer to the procedure report that was given to you for any specific questions about what was found during the examination.  If the procedure report does not answer your questions, please call your gastroenterologist to clarify.  If you requested that your care partner not be given the details of your procedure findings, then the procedure report has been included in a sealed envelope for you to review at your convenience later.  YOU SHOULD EXPECT: Some feelings of bloating in the abdomen. Passage of more gas than usual.  Walking can help get rid of the air that was put into your GI tract during the procedure and reduce the bloating. If you had a lower endoscopy (such as a colonoscopy or flexible sigmoidoscopy) you may notice spotting of blood in your stool or on the toilet paper. If you underwent a bowel prep for your procedure, you may not have a normal bowel movement for a few days.  Please Note:  You might notice some irritation and congestion in your nose or some drainage.  This is from the oxygen used during your procedure.  There is no need for concern and it should clear up in a day or so.  SYMPTOMS TO REPORT IMMEDIATELY:   Following lower endoscopy (colonoscopy or flexible sigmoidoscopy):  Excessive amounts of blood in the stool  Significant tenderness or worsening of abdominal pains  Swelling of the abdomen that is new, acute  Fever of 100F or higher   For urgent or emergent issues, a gastroenterologist can be reached at any hour by calling [phone].   DIET: Your first meal following the procedure should be a small meal and then it is ok to progress to your normal diet. Heavy or fried foods are harder to digest and may make you feel nauseous or bloated.  Likewise, meals heavy in dairy and vegetables can increase bloating.  Drink plenty of fluids but you should avoid alcoholic beverages for 24 hours. Hi fiber  diet per Dr. Olevia Perches.  ACTIVITY:  You should plan to take it easy for the rest of today and you should NOT DRIVE or use heavy machinery until tomorrow (because of the sedation medicines used during the test).    FOLLOW UP: Our staff will call the number listed on your records the next business day following your procedure to check on you and address any questions or concerns that you may have regarding the information given to you following your procedure. If we do not reach you, we will leave a message.  However, if you are feeling well and you are not experiencing any problems, there is no need to return our call.  We will assume that you have returned to your regular daily activities without incident.  Read all of the handouts given to you by your recovery room nurse.  If any biopsies were taken you will be contacted by phone or by letter within the next 1-3 weeks.  Please call us at [phone] if you have not heard about the biopsies in 3 weeks.    SIGNATURES/CONFIDENTIALITY: You and/or your care partner have signed paperwork which will be entered into your electronic medical record.  These signatures attest to the fact that that the information above on your After Visit Summary has been reviewed and is understood.  Full responsibility of the confidentiality of this discharge information lies with you and/or your care-partner.

## 2014-04-21 NOTE — Progress Notes (Signed)
Patient awakening,vss,report to rn 

## 2014-04-21 NOTE — Op Note (Signed)
[facility]  [address] [place], [id number]   COLONOSCOPY PROCEDURE REPORT  PATIENT: Barry Anthony, Barry Anthony  MR#: [id number] BIRTHDATE: [birth year] , 53  yrs. old GENDER: male ENDOSCOPIST: Lafayette Dragon, MD REFERRED NO:MVEHM Charlett Blake, M.D. PROCEDURE DATE:  04/21/2014 PROCEDURE:   Colonoscopy, screening First Screening Colonoscopy - Avg.  risk and is 50 yrs.  old or older Yes.  Prior Negative Screening - Now for repeat screening. N/A  History of Adenoma - Now for follow-up colonoscopy & has been > or = to 3 yrs.  N/A ASA CLASS:   Class II INDICATIONS:Screening for colonic neoplasia and Colorectal Neoplasm Risk Assessment for this procedure is average risk. MEDICATIONS: Monitored anesthesia care and Propofol 300 mg IV  DESCRIPTION OF PROCEDURE:   After the risks benefits and alternatives of the procedure were thoroughly explained, informed consent was obtained.  The digital rectal exam revealed no abnormalities of the rectum.   The [id number]  endoscope was introduced through the anus and advanced to the cecum, which was identified by both the appendix and ileocecal valve. No adverse events experienced.   The quality of the prep was excellent. (MoviPrep was used)  The instrument was then slowly withdrawn as the colon was fully examined.      COLON FINDINGS: There was moderate diverticulosis noted in the sigmoid colon.  Retroflexed views revealed no abnormalities. The time to cecum = 5.45 Withdrawal time = 7.29   The scope was withdrawn and the procedure completed. COMPLICATIONS: There were no immediate complications.  ENDOSCOPIC IMPRESSION: Moderate diverticulosis was noted in the sigmoid colon  RECOMMENDATIONS: High-fiber diet Recall colonoscopy in 10 years  eSigned:  Lafayette Dragon, MD 04/21/2014 9:06 AM   cc:

## 2014-04-24 ENCOUNTER — Telehealth: Payer: Self-pay | Admitting: *Deleted

## 2014-04-24 NOTE — Telephone Encounter (Signed)
  Follow up Call-  Call back number 04/21/2014  Post procedure Call Back phone  # [phone]  Permission to leave phone message Yes     Patient questions:  Message left to call us if necessary.

## 2014-04-26 ENCOUNTER — Ambulatory Visit (INDEPENDENT_AMBULATORY_CARE_PROVIDER_SITE_OTHER): Payer: 59 | Admitting: Psychology

## 2014-04-26 DIAGNOSIS — F4323 Adjustment disorder with mixed anxiety and depressed mood: Secondary | ICD-10-CM | POA: Diagnosis not present

## 2014-04-27 ENCOUNTER — Other Ambulatory Visit: Payer: Self-pay | Admitting: Family Medicine

## 2014-05-10 ENCOUNTER — Telehealth: Payer: Self-pay | Admitting: *Deleted

## 2014-05-10 NOTE — Telephone Encounter (Signed)
Prior authorization for colchicine initiated. Awaiting determination. JG//CMA  

## 2014-05-23 NOTE — Telephone Encounter (Signed)
PA denied. Appeal started.  

## 2014-05-29 ENCOUNTER — Other Ambulatory Visit: Payer: Self-pay | Admitting: Family Medicine

## 2014-07-31 ENCOUNTER — Other Ambulatory Visit: Payer: Self-pay | Admitting: Family Medicine

## 2014-08-08 ENCOUNTER — Other Ambulatory Visit (HOSPITAL_COMMUNITY)
Admission: RE | Admit: 2014-08-08 | Discharge: 2014-08-08 | Disposition: A | Discharge status: Home / Self Care | Payer: 59 | Source: Ambulatory Visit | Attending: Family Medicine | Admitting: Family Medicine

## 2014-08-08 ENCOUNTER — Ambulatory Visit (INDEPENDENT_AMBULATORY_CARE_PROVIDER_SITE_OTHER): Payer: 59 | Admitting: Family Medicine

## 2014-08-08 ENCOUNTER — Encounter: Payer: Self-pay | Admitting: Family Medicine

## 2014-08-08 VITALS — BP 130/82 | HR 59 | Temp 98.6°F | Ht 71.0 in | Wt 248.2 lb

## 2014-08-08 DIAGNOSIS — M1000 Idiopathic gout, unspecified site: Secondary | ICD-10-CM

## 2014-08-08 DIAGNOSIS — E669 Obesity, unspecified: Secondary | ICD-10-CM

## 2014-08-08 DIAGNOSIS — Z7253 High risk bisexual behavior: Secondary | ICD-10-CM

## 2014-08-08 DIAGNOSIS — I10 Essential (primary) hypertension: Secondary | ICD-10-CM

## 2014-08-08 DIAGNOSIS — Z113 Encounter for screening for infections with a predominantly sexual mode of transmission: Secondary | ICD-10-CM | POA: Diagnosis not present

## 2014-08-08 DIAGNOSIS — E119 Type 2 diabetes mellitus without complications: Secondary | ICD-10-CM

## 2014-08-08 DIAGNOSIS — Z7251 High risk heterosexual behavior: Secondary | ICD-10-CM

## 2014-08-08 DIAGNOSIS — K219 Gastro-esophageal reflux disease without esophagitis: Secondary | ICD-10-CM

## 2014-08-08 DIAGNOSIS — E782 Mixed hyperlipidemia: Secondary | ICD-10-CM

## 2014-08-08 DIAGNOSIS — E1169 Type 2 diabetes mellitus with other specified complication: Secondary | ICD-10-CM

## 2014-08-08 DIAGNOSIS — Z7252 High risk homosexual behavior: Secondary | ICD-10-CM

## 2014-08-08 DIAGNOSIS — M25511 Pain in right shoulder: Secondary | ICD-10-CM

## 2014-08-08 DIAGNOSIS — N529 Male erectile dysfunction, unspecified: Secondary | ICD-10-CM | POA: Diagnosis not present

## 2014-08-08 LAB — COMPREHENSIVE METABOLIC PANEL
ALT: 16 U/L (ref 0–53)
AST: 20 U/L (ref 0–37)
Albumin: 4.2 g/dL (ref 3.5–5.2)
Alkaline Phosphatase: 50 U/L (ref 39–117)
BUN: 18 mg/dL (ref 6–23)
CHLORIDE: 104 meq/L (ref 96–112)
CO2: 31 meq/L (ref 19–32)
CREATININE: 1.04 mg/dL (ref 0.40–1.50)
Calcium: 9.3 mg/dL (ref 8.4–10.5)
GFR: 79.74 mL/min (ref >60.00)
Glucose, Bld: 98 mg/dL (ref 70–99)
POTASSIUM: 4 meq/L (ref 3.5–5.1)
SODIUM: 141 meq/L (ref 135–145)
Total Bilirubin: 0.9 mg/dL (ref 0.2–1.2)
Total Protein: 6.9 g/dL (ref 6.0–8.3)

## 2014-08-08 LAB — LIPID PANEL
Cholesterol: 109 mg/dL (ref 0–200)
HDL: 39 mg/dL — AB (ref >39.00)
LDL Cholesterol: 51 mg/dL (ref 0–99)
NONHDL: 69.68
Total CHOL/HDL Ratio: 3
Triglycerides: 94 mg/dL (ref 0.0–149.0)
VLDL: 18.8 mg/dL (ref 0.0–40.0)

## 2014-08-08 LAB — URIC ACID: Uric Acid, Serum: 6.6 mg/dL (ref 4.0–7.8)

## 2014-08-08 LAB — TSH: TSH: 1.33 u[IU]/mL (ref 0.35–4.50)

## 2014-08-08 LAB — HIV ANTIBODY (ROUTINE TESTING W REFLEX): HIV 1&2 Ab, 4th Generation: NONREACTIVE

## 2014-08-08 LAB — CBC
HCT: 44.3 % (ref 39.0–52.0)
Hemoglobin: 14.8 g/dL (ref 13.0–17.0)
MCHC: 33.4 g/dL (ref 30.0–36.0)
MCV: 88.2 fl (ref 78.0–100.0)
PLATELETS: 241 10*3/uL (ref 150.0–400.0)
RBC: 5.03 Mil/uL (ref 4.22–5.81)
RDW: 13.7 % (ref 11.5–15.5)
WBC: 8.4 10*3/uL (ref 4.0–10.5)

## 2014-08-08 LAB — HEMOGLOBIN A1C: Hgb A1c MFr Bld: 5.5 % (ref 4.6–6.5)

## 2014-08-08 MED ORDER — ATORVASTATIN CALCIUM 20 MG PO TABS: 20.0000 mg | ORAL_TABLET | Freq: Every day | ORAL | Status: DC

## 2014-08-08 MED ORDER — SILDENAFIL CITRATE 20 MG PO TABS: ORAL_TABLET | ORAL | Status: DC

## 2014-08-08 MED ORDER — LISINOPRIL 40 MG PO TABS: 40.0000 mg | ORAL_TABLET | Freq: Every day | ORAL | Status: DC

## 2014-08-08 MED ORDER — NEBIVOLOL HCL 10 MG PO TABS: 10.0000 mg | ORAL_TABLET | Freq: Every day | ORAL | Status: DC

## 2014-08-08 MED ORDER — ALBUTEROL SULFATE HFA 108 (90 BASE) MCG/ACT IN AERS: INHALATION_SPRAY | RESPIRATORY_TRACT | Status: DC

## 2014-08-08 MED ORDER — NEBIVOLOL HCL 10 MG PO TABS
10.0000 mg | ORAL_TABLET | Freq: Every day | ORAL | Status: DC
Start: 2014-08-08 — End: 2014-08-08

## 2014-08-08 NOTE — Progress Notes (Signed)
Pre visit review using our clinic review tool, if applicable. No additional management support is needed unless otherwise documented below in the visit note. 

## 2014-08-08 NOTE — Progress Notes (Signed)
Barry Anthony  [id number] 1960-05-03 08/08/2014      Progress Note-Follow Up  Subjective  Chief Complaint  Chief Complaint  Patient presents with  . Follow-up    HPI  Patient is a 54 y.o. male in today for routine medical care. Patient is in today for follow-up.His biggest complaint  His insurance will no lo He has not had a gout  No recent illness.Requesting a refill on his sildenafil. Denies CP/palp/SOB/HA/congestion/fevers/GI or GU c/o. Taking meds as prescribed  Past Medical History  Diagnosis Date  . Allergic rhinitis   . GERD (gastroesophageal reflux disease)   . Hyperlipidemia   . Hypertension   . History of chicken pox   . Bilateral hydrocele 12/08/2012  . Preventative health care 02/12/2014  . Allergy   . Lipoma of shoulder     Past Surgical History  Procedure Laterality Date  . Knee surgery  1981    right knee  . Tonsillectomy  1969  . Eye surgery    . Hernia repair      groin  . Wisdom tooth extraction      Family History  Problem Relation Age of Onset  . Thyroid cancer Father   . Hypertension Father   . Coronary artery disease Father   . Hyperlipidemia Father   . Alcohol abuse Father     smoker  . Heart disease Father   . Other      no colon cancer, no prostate canceer  . Hypothyroidism Mother   . Dementia Maternal Aunt   . Dementia Maternal Uncle   . Cancer Maternal Grandmother     breast  . Heart disease Paternal Grandmother     heart disease  . Colon cancer Neg Hx     History   Social History  . Marital Status: Married    Spouse Name: N/A  . Number of Children: N/A  . Years of Education: N/A   Occupational History  . Not on file.   Social History Main Topics  . Smoking status: Never Smoker   . Smokeless tobacco: Never Used  . Alcohol Use: Yes     Comment: 2 drinks / week  . Drug Use: No  . Sexual Activity: Yes     Comment: lives with wife, works as a Public house manager at [employer]. no major dietary restrictions    Other Topics Concern  . 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 fluticasone (FLONASE) 50 MCG/ACT nasal spray Place 2 sprays into both nostrils daily as needed. 48 g 3  . sildenafil (REVATIO) 20 MG tablet 2-3 tabs po daily prn ED 40 tablet 2  . [DISCONTINUED] metFORMIN (GLUCOPHAGE) 500 MG tablet Take 500 mg by mouth 1 day or 1 dose.      No current facility-administered medications on file prior to visit.    No Known Allergies  Review of Systems  Review of Systems  Constitutional: Negative for fever and malaise/fatigue.  HENT: Negative for congestion.   Eyes: Negative for discharge.  Respiratory: Negative for shortness of breath.   Cardiovascular: Negative for chest pain, palpitations and leg swelling.  Gastrointestinal: Negative for nausea, abdominal pain and diarrhea.  Genitourinary: Negative for dysuria.  Musculoskeletal:  Negative for falls.  Skin: Negative for rash.  Neurological: Negative for loss of consciousness and headaches.  Endo/Heme/Allergies: Negative for polydipsia.  Psychiatric/Behavioral: Negative for depression and suicidal ideas. The patient is not nervous/anxious and does not have insomnia.     Objective  BP 130/82 mmHg  Pulse 59  Temp(Src) 98.6 F (37 C) (Oral)  Ht 5\' 11"  (1.803 m)  Wt 248 lb 4 oz (112.605 kg)  BMI 34.64 kg/m2  SpO2 97%  Physical Exam  Physical Exam  Constitutional: He is oriented to person, place, and time and well-developed, well-nourished, and in no distress. No distress.  HENT:  Head: Normocephalic and atraumatic.  Eyes: Conjunctivae are normal.  Neck: Neck supple. No thyromegaly present.  Cardiovascular: Normal rate, regular rhythm and normal heart sounds.   No murmur heard. Pulmonary/Chest: Effort normal and  breath sounds normal. No respiratory distress.  Abdominal: He exhibits no distension and no mass. There is no tenderness.  Musculoskeletal: He exhibits no edema.  Neurological: He is alert and oriented to person, place, and time.  Skin: Skin is warm.  Psychiatric: Memory, affect and judgment normal.    Lab Results  Component Value Date   TSH 1.21 02/07/2014   Lab Results  Component Value Date   WBC 7.9 02/07/2014   HGB 15.3 02/07/2014   HCT 44.5 02/07/2014   MCV 85.8 02/07/2014   PLT 260.0 02/07/2014   Lab Results  Component Value Date   CREATININE 1.20 02/07/2014   BUN 16 02/07/2014   NA 136 02/07/2014   K 4.0 02/07/2014   CL 102 02/07/2014   CO2 27 02/07/2014   Lab Results  Component Value Date   ALT 20 02/07/2014   AST 27 02/07/2014   ALKPHOS 47 02/07/2014   BILITOT 0.8 02/07/2014   Lab Results  Component Value Date   CHOL 107 02/07/2014   Lab Results  Component Value Date   HDL 34.50* 02/07/2014   Lab Results  Component Value Date   LDLCALC 49 02/07/2014   Lab Results  Component Value Date   TRIG 119.0 02/07/2014   Lab Results  Component Value Date   CHOLHDL 3 02/07/2014     Assessment & Plan   Gout Insurance will not pay for Colchicine so will switch to Allopurinol 100 mg daily and use the Colchicine 0.6 mg tabs 2 tabs po once then 1 tab every 2 hours til pain relief, diarrhea or max of 6 tabs in 24 hours.  Shoulder pain Improved initially with treatments with sports med but did not stay on Gabapentin did not seem to help much may return to sports med and can use Meloxicam prn, add Curcumen  Essential hypertension Well controlled, no changes to meds. Encouraged heart healthy diet such as the DASH diet and exercise as tolerated.   GERD Avoid offending foods, start probiotics. Do not eat large meals in late evening and consider raising head of bed.   ED (erectile dysfunction) Given refill on Sildenafil to use prn  Diabetes mellitus type 2  in obese hgba1c acceptable, minimize simple carbs. Increase exercise as tolerated. Continue current meds  Hyperlipidemia, mixed Encouraged heart healthy diet, increase exercise, avoid trans fats, consider a krill oil cap daily

## 2014-08-08 NOTE — Assessment & Plan Note (Addendum)
Improved initially with treatments with sports med but did not stay on Gabapentin did not seem to help much may return to sports med and can use Meloxicam prn, add Curcumen

## 2014-08-08 NOTE — Assessment & Plan Note (Signed)
Insurance will not pay for Colchicine so will switch to Allopurinol 100 mg daily and use the Colchicine 0.6 mg tabs 2 tabs po once then 1 tab every 2 hours til pain relief, diarrhea or max of 6 tabs in 24 hours.

## 2014-08-08 NOTE — Patient Instructions (Addendum)
Use only for acute flares:  Colchicine 0.6 mg tabs 2 tabs po once then 1 tab every 2 hours til pain relief, diarrhea or max of 6 tabs in 24 hours. Curcumin NOW brand found on luckvitamins.com for anti-inflammatory aide     Gout Gout is an inflammatory arthritis caused by a buildup of uric acid crystals in the joints. Uric acid is a chemical that is normally present in the blood. When the level of uric acid in the blood is too high it can form crystals that deposit in your joints and tissues. This causes joint redness, soreness, and swelling (inflammation). Repeat attacks are common. Over time, uric acid crystals can form into masses (tophi) near a joint, destroying bone and causing disfigurement. Gout is treatable and often preventable. CAUSES  The disease begins with elevated levels of uric acid in the blood. Uric acid is produced by your body when it breaks down a naturally found substance called purines. Certain foods you eat, such as meats and fish, contain high amounts of purines. Causes of an elevated uric acid level include:  Being passed down from parent to child (heredity).  Diseases that cause increased uric acid production (such as obesity, psoriasis, and certain cancers).  Excessive alcohol use.  Diet, especially diets rich in meat and seafood.  Medicines, including certain cancer-fighting medicines (chemotherapy), water pills (diuretics), and aspirin.  Chronic kidney disease. The kidneys are no longer able to remove uric acid well.  Problems with metabolism. Conditions strongly associated with gout include:  Obesity.  High blood pressure.  High cholesterol.  Diabetes. Not everyone with elevated uric acid levels gets gout. It is not understood why some people get gout and others do not. Surgery, joint injury, and eating too much of certain foods are some of the factors that can lead to gout attacks. SYMPTOMS   An attack of gout comes on quickly. It causes intense pain  with redness, swelling, and warmth in a joint.  Fever can occur.  Often, only one joint is involved. Certain joints are more commonly involved:  Base of the big toe.  Knee.  Ankle.  Wrist.  Finger. Without treatment, an attack usually goes away in a few days to weeks. Between attacks, you usually will not have symptoms, which is different from many other forms of arthritis. DIAGNOSIS  Your caregiver will suspect gout based on your symptoms and exam. In some cases, tests may be recommended. The tests may include:  Blood tests.  Urine tests.  X-rays.  Joint fluid exam. This exam requires a needle to remove fluid from the joint (arthrocentesis). Using a microscope, gout is confirmed when uric acid crystals are seen in the joint fluid. TREATMENT  There are two phases to gout treatment: treating the sudden onset (acute) attack and preventing attacks (prophylaxis).  Treatment of an Acute Attack.  Medicines are used. These include anti-inflammatory medicines or steroid medicines.  An injection of steroid medicine into the affected joint is sometimes necessary.  The painful joint is rested. Movement can worsen the arthritis.  You may use warm or cold treatments on painful joints, depending which works best for you.  Treatment to Prevent Attacks.  If you suffer from frequent gout attacks, your caregiver may advise preventive medicine. These medicines are started after the acute attack subsides. These medicines either help your kidneys eliminate uric acid from your body or decrease your uric acid production. You may need to stay on these medicines for a very long time.  The  early phase of treatment with preventive medicine can be associated with an increase in acute gout attacks. For this reason, during the first few months of treatment, your caregiver may also advise you to take medicines usually used for acute gout treatment. Be sure you understand your caregiver's directions. Your  caregiver may make several adjustments to your medicine dose before these medicines are effective.  Discuss dietary treatment with your caregiver or dietitian. Alcohol and drinks high in sugar and fructose and foods such as meat, poultry, and seafood can increase uric acid levels. Your caregiver or dietitian can advise you on drinks and foods that should be limited. HOME CARE INSTRUCTIONS   Do not take aspirin to relieve pain. This raises uric acid levels.  Only take over-the-counter or prescription medicines for pain, discomfort, or fever as directed by your caregiver.  Rest the joint as much as possible. When in bed, keep sheets and blankets off painful areas.  Keep the affected joint raised (elevated).  Apply warm or cold treatments to painful joints. Use of warm or cold treatments depends on which works best for you.  Use crutches if the painful joint is in your leg.  Drink enough fluids to keep your urine clear or pale yellow. This helps your body get rid of uric acid. Limit alcohol, sugary drinks, and fructose drinks.  Follow your dietary instructions. Pay careful attention to the amount of protein you eat. Your daily diet should emphasize fruits, vegetables, whole grains, and fat-free or low-fat milk products. Discuss the use of coffee, vitamin C, and cherries with your caregiver or dietitian. These may be helpful in lowering uric acid levels.  Maintain a healthy body weight. SEEK MEDICAL CARE IF:   You develop diarrhea, vomiting, or any side effects from medicines.  You do not feel better in 24 hours, or you are getting worse. SEEK IMMEDIATE MEDICAL CARE IF:   Your joint becomes suddenly more tender, and you have chills or a fever. MAKE SURE YOU:   Understand these instructions.  Will watch your condition.  Will get help right away if you are not doing well or get worse. Document Released: 12/21/1999 Document Revised: 05/09/2013 Document Reviewed: 08/06/2011 [facility]  Patient Information 2015 [place], [place]. This information is not intended to replace advice given to you by your health care provider. Make sure you discuss any questions you have with your health care provider. Colchicine 0.6 mg tabs 2 tabs po once then 1 tab every 2 hours til pain relief, diarrhea or max of 6 tabs in 24 hours.

## 2014-08-09 LAB — URINE CYTOLOGY ANCILLARY ONLY
Chlamydia: NEGATIVE
NEISSERIA GONORRHEA: NEGATIVE
TRICH (WINDOWPATH): NEGATIVE

## 2014-08-09 LAB — RPR

## 2014-08-11 ENCOUNTER — Other Ambulatory Visit: Payer: Self-pay | Admitting: Family Medicine

## 2014-08-11 LAB — URINE CYTOLOGY ANCILLARY ONLY
Bacterial vaginitis: POSITIVE — AB
CANDIDA VAGINITIS: NEGATIVE

## 2014-08-11 MED ORDER — METRONIDAZOLE 500 MG PO TABS: 500.0000 mg | ORAL_TABLET | Freq: Two times a day (BID) | ORAL | Status: DC

## 2014-08-20 MED ORDER — ALLOPURINOL 100 MG PO TABS: 100.0000 mg | ORAL_TABLET | Freq: Every day | ORAL | Status: DC

## 2014-08-20 NOTE — Assessment & Plan Note (Signed)
Avoid offending foods, start probiotics. Do not eat large meals in late evening and consider raising head of bed.  

## 2014-08-20 NOTE — Assessment & Plan Note (Signed)
Encouraged heart healthy diet, increase exercise, avoid trans fats, consider a krill oil cap daily 

## 2014-08-20 NOTE — Assessment & Plan Note (Signed)
Given refill on Sildenafil to use prn

## 2014-08-20 NOTE — Assessment & Plan Note (Signed)
hgba1c acceptable, minimize simple carbs. Increase exercise as tolerated. Continue current meds 

## 2014-08-20 NOTE — Assessment & Plan Note (Signed)
Well controlled, no changes to meds. Encouraged heart healthy diet such as the DASH diet and exercise as tolerated.  °

## 2014-12-06 ENCOUNTER — Ambulatory Visit (INDEPENDENT_AMBULATORY_CARE_PROVIDER_SITE_OTHER): Payer: 59 | Admitting: Behavioral Health

## 2014-12-06 DIAGNOSIS — Z23 Encounter for immunization: Secondary | ICD-10-CM

## 2014-12-06 NOTE — Progress Notes (Signed)
Pre visit review using our clinic review tool, if applicable. No additional management support is needed unless otherwise documented below in the visit note. 

## 2015-01-03 ENCOUNTER — Other Ambulatory Visit: Payer: Self-pay | Admitting: Family Medicine

## 2015-01-22 MED FILL — LISINOPRIL 40 MG TABLET: 40 | 31 days supply | Qty: 31 | Fill #4

## 2015-01-22 MED FILL — ALLOPURINOL 100 MG TABLET: 100 | 30 days supply | Qty: 30 | Fill #3

## 2015-01-22 MED FILL — ATORVASTATIN 20 MG TABLET: 20 | 31 days supply | Qty: 31 | Fill #4

## 2015-01-22 MED FILL — BYSTOLIC 10 MG TABLET: 10 | 30 days supply | Qty: 30 | Fill #4

## 2015-02-22 MED FILL — LISINOPRIL 40 MG TABLET: 40 | 31 days supply | Qty: 31 | Fill #5

## 2015-02-22 MED FILL — ATORVASTATIN 20 MG TABLET: 20 | 31 days supply | Qty: 31 | Fill #5

## 2015-02-22 MED FILL — BYSTOLIC 10 MG TABLET: 10 | 30 days supply | Qty: 30 | Fill #5

## 2015-02-22 MED FILL — ALLOPURINOL 100 MG TABLET: 100 | 30 days supply | Qty: 30 | Fill #4

## 2015-02-26 ENCOUNTER — Other Ambulatory Visit: Payer: Self-pay | Admitting: Family Medicine

## 2015-03-26 MED FILL — LISINOPRIL 40 MG TABLET: 40 | 31 days supply | Qty: 31 | Fill #6

## 2015-03-26 MED FILL — BYSTOLIC 10 MG TABLET: 10 | 30 days supply | Qty: 30 | Fill #6

## 2015-03-26 MED FILL — ATORVASTATIN 20 MG TABLET: 20 | 31 days supply | Qty: 31 | Fill #6

## 2015-03-26 MED FILL — ALLOPURINOL 100 MG TABLET: 100 | 30 days supply | Qty: 30 | Fill #5

## 2015-04-25 MED FILL — ATORVASTATIN 20 MG TABLET: 20 | 31 days supply | Qty: 31 | Fill #7

## 2015-04-25 MED FILL — BYSTOLIC 10 MG TABLET: 10 | 30 days supply | Qty: 30 | Fill #7

## 2015-04-25 MED FILL — LISINOPRIL 40 MG TABLET: 40 | 31 days supply | Qty: 31 | Fill #7

## 2015-04-25 MED FILL — ALLOPURINOL 100 MG TABLET: 100 | 30 days supply | Qty: 30 | Fill #6

## 2015-05-25 ENCOUNTER — Other Ambulatory Visit: Payer: Self-pay | Admitting: Family Medicine

## 2015-05-25 MED ORDER — LISINOPRIL 40 MG PO TABS: 40.0000 mg | ORAL_TABLET | Freq: Every day | ORAL | Status: DC

## 2015-05-25 MED ORDER — ATORVASTATIN CALCIUM 20 MG PO TABS: 20.0000 mg | ORAL_TABLET | Freq: Every day | ORAL | Status: DC

## 2015-05-25 MED FILL — ATORVASTATIN 20 MG TABLET: 20 | 31 days supply | Qty: 31 | Fill #0

## 2015-05-25 MED FILL — LISINOPRIL 40 MG TABLET: 40 | 31 days supply | Qty: 31 | Fill #0

## 2015-05-25 MED FILL — ALLOPURINOL 100 MG TABLET: 100 | 30 days supply | Qty: 30 | Fill #0

## 2015-05-25 MED FILL — BYSTOLIC 10 MG TABLET: 10 | 30 days supply | Qty: 30 | Fill #8

## 2015-06-26 ENCOUNTER — Other Ambulatory Visit (HOSPITAL_COMMUNITY)
Admission: RE | Admit: 2015-06-26 | Discharge: 2015-06-26 | Disposition: A | Discharge status: Home / Self Care | Payer: 59 | Source: Ambulatory Visit | Attending: Family Medicine | Admitting: Family Medicine

## 2015-06-26 ENCOUNTER — Ambulatory Visit (INDEPENDENT_AMBULATORY_CARE_PROVIDER_SITE_OTHER): Payer: 59 | Admitting: Family Medicine

## 2015-06-26 ENCOUNTER — Encounter: Payer: Self-pay | Admitting: Family Medicine

## 2015-06-26 VITALS — BP 118/72 | HR 55 | Temp 98.5°F | Ht 71.0 in | Wt 273.2 lb

## 2015-06-26 DIAGNOSIS — Z113 Encounter for screening for infections with a predominantly sexual mode of transmission: Secondary | ICD-10-CM | POA: Diagnosis present

## 2015-06-26 DIAGNOSIS — Z0000 Encounter for general adult medical examination without abnormal findings: Secondary | ICD-10-CM

## 2015-06-26 DIAGNOSIS — K219 Gastro-esophageal reflux disease without esophagitis: Secondary | ICD-10-CM

## 2015-06-26 DIAGNOSIS — R739 Hyperglycemia, unspecified: Secondary | ICD-10-CM

## 2015-06-26 DIAGNOSIS — I10 Essential (primary) hypertension: Secondary | ICD-10-CM

## 2015-06-26 DIAGNOSIS — Z7251 High risk heterosexual behavior: Secondary | ICD-10-CM

## 2015-06-26 DIAGNOSIS — E782 Mixed hyperlipidemia: Secondary | ICD-10-CM

## 2015-06-26 DIAGNOSIS — E669 Obesity, unspecified: Secondary | ICD-10-CM

## 2015-06-26 MED ORDER — ALLOPURINOL 100 MG PO TABS: ORAL_TABLET | ORAL | Status: DC

## 2015-06-26 MED ORDER — SILDENAFIL CITRATE 20 MG PO TABS: ORAL_TABLET | ORAL | Status: DC

## 2015-06-26 MED ORDER — NEBIVOLOL HCL 10 MG PO TABS: 10.0000 mg | ORAL_TABLET | Freq: Every day | ORAL | Status: DC

## 2015-06-26 MED ORDER — LISINOPRIL 40 MG PO TABS: 40.0000 mg | ORAL_TABLET | Freq: Every day | ORAL | Status: DC

## 2015-06-26 MED ORDER — ATORVASTATIN CALCIUM 20 MG PO TABS: 20.0000 mg | ORAL_TABLET | Freq: Every day | ORAL | Status: DC

## 2015-06-26 MED FILL — BYSTOLIC 10 MG TABLET: 10 | 30 days supply | Qty: 30 | Fill #0

## 2015-06-26 MED FILL — ATORVASTATIN 20 MG TABLET: 20 | 30 days supply | Qty: 30 | Fill #0

## 2015-06-26 MED FILL — LISINOPRIL 40 MG TABLET: 40 | 30 days supply | Qty: 30 | Fill #0

## 2015-06-26 MED FILL — ALLOPURINOL 100 MG TABLET: 100 | 30 days supply | Qty: 30 | Fill #0

## 2015-06-26 NOTE — Patient Instructions (Addendum)
The [employer]  DASH Eating Plan DASH stands for "Dietary Approaches to Stop Hypertension." The DASH eating plan is a healthy eating plan that has been shown to reduce high blood pressure (hypertension). Additional health benefits may include reducing the risk of type 2 diabetes mellitus, heart disease, and stroke. The DASH eating plan may also help with weight loss. WHAT DO I NEED TO KNOW ABOUT THE DASH EATING PLAN? For the DASH eating plan, you will follow these general guidelines:  Choose foods with a percent daily value for sodium of less than 5% (as listed on the food label).  Use salt-free seasonings or herbs instead of table salt or sea salt.  Check with your health care provider or pharmacist before using salt substitutes.  Eat lower-sodium products, often labeled as "lower sodium" or "no salt added."  Eat fresh foods.  Eat more vegetables, fruits, and low-fat dairy products.  Choose whole grains. Look for the word "whole" as the first word in the ingredient list.  Choose fish and skinless chicken or [place] more often than red meat. Limit fish, poultry, and meat to 6 oz (170 g) each day.  Limit sweets, desserts, sugars, and sugary drinks.  Choose heart-healthy fats.  Limit cheese to 1 oz (28 g) per day.  Eat more home-cooked food and less restaurant, buffet, and fast food.  Limit fried foods.  Cook foods using methods other than frying.  Limit canned vegetables. If you do use them, rinse them well to decrease the sodium.  When eating at a restaurant, ask that your food be prepared with less salt, or no salt if possible. WHAT FOODS CAN I EAT? Seek help from a dietitian for individual calorie needs. Grains Whole grain or whole wheat bread. Brown rice. Whole grain or whole wheat pasta. Quinoa, bulgur, and whole grain cereals. Low-sodium cereals. Corn or whole wheat flour tortillas. Whole grain cornbread. Whole grain crackers. Low-sodium crackers. Vegetables Fresh or  frozen vegetables (raw, steamed, roasted, or grilled). Low-sodium or reduced-sodium tomato and vegetable juices. Low-sodium or reduced-sodium tomato sauce and paste. Low-sodium or reduced-sodium canned vegetables.  Fruits All fresh, canned (in natural juice), or frozen fruits. Meat and Other Protein Products Ground beef (85% or leaner), grass-fed beef, or beef trimmed of fat. Skinless chicken or [place]. Ground chicken or [place]. Pork trimmed of fat. All fish and seafood. Eggs. Dried beans, peas, or lentils. Unsalted nuts and seeds. Unsalted canned beans. Dairy Low-fat dairy products, such as skim or 1% milk, 2% or reduced-fat cheeses, low-fat ricotta or cottage cheese, or plain low-fat yogurt. Low-sodium or reduced-sodium cheeses. Fats and Oils Tub margarines without trans fats. Light or reduced-fat mayonnaise and salad dressings (reduced sodium). Avocado. Safflower, olive, or canola oils. Natural peanut or almond butter. Other Unsalted popcorn and pretzels. The items listed above may not be a complete list of recommended foods or beverages. Contact your dietitian for more options. WHAT FOODS ARE NOT RECOMMENDED? Grains White bread. White pasta. White rice. Refined cornbread. Bagels and croissants. Crackers that contain trans fat. Vegetables Creamed or fried vegetables. Vegetables in a cheese sauce. Regular canned vegetables. Regular canned tomato sauce and paste. Regular tomato and vegetable juices. Fruits Dried fruits. Canned fruit in light or heavy syrup. Fruit juice. Meat and Other Protein Products Fatty cuts of meat. Ribs, chicken wings, bacon, sausage, bologna, salami, chitterlings, fatback, hot dogs, bratwurst, and packaged luncheon meats. Salted nuts and seeds. Canned beans with salt. Dairy Whole or 2% milk, cream, half-and-half, and cream cheese. Whole-fat  or sweetened yogurt. Full-fat cheeses or blue cheese. Nondairy creamers and whipped toppings. Processed cheese, cheese spreads, or  cheese curds. Condiments Onion and garlic salt, seasoned salt, table salt, and sea salt. Canned and packaged gravies. Worcestershire sauce. Tartar sauce. Barbecue sauce. Teriyaki sauce. Soy sauce, including reduced sodium. Steak sauce. Fish sauce. Oyster sauce. Cocktail sauce. Horseradish. Ketchup and mustard. Meat flavorings and tenderizers. Bouillon cubes. Hot sauce. Tabasco sauce. Marinades. Taco seasonings. Relishes. Fats and Oils Butter, stick margarine, lard, shortening, ghee, and bacon fat. Coconut, palm kernel, or palm oils. Regular salad dressings. Other Pickles and olives. Salted popcorn and pretzels. The items listed above may not be a complete list of foods and beverages to avoid. Contact your dietitian for more information. WHERE CAN I FIND MORE INFORMATION? National Heart, Lung, and Blood Institute: travelstabloid.com   This information is not intended to replace advice given to you by your health care provider. Make sure you discuss any questions you have with your health care provider.   Document Released: 12/12/2010 Document Revised: 01/13/2014 Document Reviewed: 10/27/2012 Elsevier Interactive Patient Education [employer].

## 2015-06-26 NOTE — Assessment & Plan Note (Signed)
Well controlled, no changes to meds. Encouraged heart healthy diet such as the DASH diet and exercise as tolerated.  °

## 2015-06-26 NOTE — Progress Notes (Signed)
Pre visit review using our clinic review tool, if applicable. No additional management support is needed unless otherwise documented below in the visit note. 

## 2015-06-26 NOTE — Assessment & Plan Note (Signed)
hgba1c acceptable minimize simple carbs. Increase exercise as tolerated.

## 2015-06-27 LAB — TSH: TSH: 1.4 u[IU]/mL (ref 0.35–4.50)

## 2015-06-27 LAB — COMPREHENSIVE METABOLIC PANEL
ALT: 18 U/L (ref 0–53)
AST: 23 U/L (ref 0–37)
Albumin: 4.3 g/dL (ref 3.5–5.2)
Alkaline Phosphatase: 49 U/L (ref 39–117)
BUN: 21 mg/dL (ref 6–23)
CALCIUM: 9.6 mg/dL (ref 8.4–10.5)
CHLORIDE: 101 meq/L (ref 96–112)
CO2: 31 meq/L (ref 19–32)
Creatinine, Ser: 1.35 mg/dL (ref 0.40–1.50)
GFR: 58.81 mL/min — AB (ref >60.00)
GLUCOSE: 77 mg/dL (ref 70–99)
Potassium: 4.5 mEq/L (ref 3.5–5.1)
Sodium: 139 mEq/L (ref 135–145)
Total Bilirubin: 0.7 mg/dL (ref 0.2–1.2)
Total Protein: 7 g/dL (ref 6.0–8.3)

## 2015-06-27 LAB — CBC
HEMATOCRIT: 45.9 % (ref 39.0–52.0)
HEMOGLOBIN: 15.3 g/dL (ref 13.0–17.0)
MCHC: 33.3 g/dL (ref 30.0–36.0)
MCV: 87.3 fl (ref 78.0–100.0)
Platelets: 224 10*3/uL (ref 150.0–400.0)
RBC: 5.26 Mil/uL (ref 4.22–5.81)
RDW: 13.3 % (ref 11.5–15.5)
WBC: 8 10*3/uL (ref 4.0–10.5)

## 2015-06-27 LAB — LIPID PANEL
CHOL/HDL RATIO: 3
Cholesterol: 107 mg/dL (ref 0–200)
HDL: 35.7 mg/dL — ABNORMAL LOW (ref >39.00)
LDL CALC: 42 mg/dL (ref 0–99)
NONHDL: 71.56
TRIGLYCERIDES: 147 mg/dL (ref 0.0–149.0)
VLDL: 29.4 mg/dL (ref 0.0–40.0)

## 2015-06-27 LAB — RPR

## 2015-06-27 LAB — HEMOGLOBIN A1C: Hgb A1c MFr Bld: 5.6 % (ref 4.6–6.5)

## 2015-06-27 LAB — HIV ANTIBODY (ROUTINE TESTING W REFLEX): HIV 1&2 Ab, 4th Generation: NONREACTIVE

## 2015-06-28 LAB — URINE CYTOLOGY ANCILLARY ONLY
CHLAMYDIA, DNA PROBE: NEGATIVE
Neisseria Gonorrhea: NEGATIVE
Trichomonas: NEGATIVE

## 2015-07-01 DIAGNOSIS — Z7251 High risk heterosexual behavior: Secondary | ICD-10-CM | POA: Insufficient documentation

## 2015-07-01 NOTE — Assessment & Plan Note (Signed)
Avoid offending foods, start probiotics. Do not eat large meals in late evening and consider raising head of bed.  

## 2015-07-01 NOTE — Assessment & Plan Note (Signed)
Patient encouraged to maintain heart healthy diet, regular exercise, adequate sleep. Consider daily probiotics. Take medications as prescribed. Given and reviewed copy of ACP documents from [place] Secretary of State and encouraged to complete and return 

## 2015-07-01 NOTE — Assessment & Plan Note (Signed)
Encouraged DASH diet, decrease po intake and increase exercise as tolerated. Needs 7-8 hours of sleep nightly. Avoid trans fats, eat small, frequent meals every 4-5 hours with lean proteins, complex carbs and healthy fats. Minimize simple carbs 

## 2015-07-01 NOTE — Assessment & Plan Note (Signed)
Encouraged heart healthy diet, increase exercise, avoid trans fats, consider a krill oil cap daily 

## 2015-07-01 NOTE — Progress Notes (Signed)
Patient ID: Barry Anthony, male   DOB: 06-May-1960, 55 y.o.   MRN: [id number]   Subjective:    Patient ID: Barry Anthony, male    DOB: 10/14/1960, 55 y.o.   MRN: [id number]  Chief Complaint  Patient presents with  . Follow-up    HPI Patient is in today for annual exam. He is feeling well. No recent illness or acute concerns. Is now divorced and has had a new sexual partner so is asking for testing today. No discharge or concerns. Is frustrated with thinning hair and notes some occasional dyspepsia. Denies CP/palp/SOB/HA/congestion/fevers/GI or GU c/o. Taking meds as prescribed  Past Medical History  Diagnosis Date  . Allergic rhinitis   . GERD (gastroesophageal reflux disease)   . Hyperlipidemia   . Hypertension   . History of chicken pox   . Bilateral hydrocele 12/08/2012  . Preventative health care 02/12/2014  . Allergy   . Lipoma of shoulder   . Hyperglycemia 08/15/2008    Qualifier: Diagnosis of  By: Wynona Luna      Past Surgical History  Procedure Laterality Date  . Knee surgery  1981    right knee  . Tonsillectomy  1969  . Eye surgery    . Hernia repair      groin  . Wisdom tooth extraction      Family History  Problem Relation Age of Onset  . Thyroid cancer Father   . Hypertension Father   . Coronary artery disease Father   . Hyperlipidemia Father   . Alcohol abuse Father     smoker  . Heart disease Father   . Other      no colon cancer, no prostate canceer  . Hypothyroidism Mother   . Dementia Maternal Aunt   . Dementia Maternal Uncle   . Cancer Maternal Grandmother     breast  . Heart disease Paternal Grandmother     heart disease  . Colon cancer Neg Hx     Social History   Social History  . Marital Status: Married    Spouse Name: N/A  . Number of Children: N/A  . Years of Education: N/A   Occupational History  . Not on file.   Social History Main Topics  . Smoking status: Never Smoker   . Smokeless tobacco: Never Used  . Alcohol Use:  Yes     Comment: 2 drinks / week  . Drug Use: No  . Sexual Activity: Yes     Comment: lives with wife, works as a Public house manager at [employer]. no major dietary restrictions   Other Topics Concern  . Not on file   Social History Narrative   Occupation:  Parts mgr   Married 16 yrs    2 children  10, 12    Never Smoked    Alcohol use-yes (social)     Outpatient Prescriptions Prior to Visit  Medication Sig Dispense Refill  . albuterol (PROAIR HFA) 108 (90 BASE) MCG/ACT inhaler INHALE 2 PUFFS INTO THE LUNGS DAILY AS NEEDED 8.5 g 3  . fluticasone (FLONASE) 50 MCG/ACT nasal spray Place 2 sprays into both nostrils daily as needed. 48 g 3  . metroNIDAZOLE (FLAGYL) 500 MG tablet Take 1 tablet (500 mg total) by mouth 2 (two) times daily. 14 tablet 0  . allopurinol (ZYLOPRIM) 100 MG tablet TAKE 1 TABLET (100 MG TOTAL) BY MOUTH DAILY. 30 tablet 6  . atorvastatin (LIPITOR) 20 MG tablet Take 1 tablet (20 mg  total) by mouth daily. 90 tablet 2  . lisinopril (PRINIVIL,ZESTRIL) 40 MG tablet Take 1 tablet (40 mg total) by mouth daily. 90 tablet 2  . nebivolol (BYSTOLIC) 10 MG tablet Take 1 tablet (10 mg total) by mouth daily. 90 tablet 2  . sildenafil (REVATIO) 20 MG tablet TAKE 2 TO 3 TABLETS BY MOUTH DAILY AS NEEDED FORED 40 tablet 0   No facility-administered medications prior to visit.    No Known Allergies  Review of Systems  Constitutional: Negative for fever, chills and malaise/fatigue.  HENT: Negative for congestion and hearing loss.   Eyes: Negative for discharge.  Respiratory: Negative for cough, sputum production and shortness of breath.   Cardiovascular: Negative for chest pain, palpitations and leg swelling.  Gastrointestinal: Negative for heartburn, nausea, vomiting, abdominal pain, diarrhea, constipation and blood in stool.  Genitourinary: Negative for dysuria, urgency, frequency and hematuria.  Musculoskeletal: Negative for myalgias, back pain and falls.  Skin:  Negative for rash.  Neurological: Negative for dizziness, sensory change, loss of consciousness, weakness and headaches.  Endo/Heme/Allergies: Negative for environmental allergies. Does not bruise/bleed easily.  Psychiatric/Behavioral: Negative for depression and suicidal ideas. The patient is not nervous/anxious and does not have insomnia.        Objective:    Physical Exam  Constitutional: He is oriented to person, place, and time. He appears well-developed and well-nourished. No distress.  HENT:  Head: Normocephalic and atraumatic.  Eyes: Conjunctivae are normal.  Neck: Neck supple. No thyromegaly present.  Cardiovascular: Normal rate, regular rhythm and normal heart sounds.   No murmur heard. Pulmonary/Chest: Effort normal and breath sounds normal. No respiratory distress. He has no wheezes.  Abdominal: Soft. Bowel sounds are normal. He exhibits no mass. There is no tenderness.  Musculoskeletal: He exhibits no edema.  Lymphadenopathy:    He has no cervical adenopathy.  Neurological: He is alert and oriented to person, place, and time.  Skin: Skin is warm and dry.  Psychiatric: He has a normal mood and affect. His behavior is normal.    BP 118/72 mmHg  Pulse 55  Temp(Src) 98.5 F (36.9 C) (Oral)  Ht 5\' 11"  (1.803 m)  Wt 273 lb 4 oz (123.945 kg)  BMI 38.13 kg/m2  SpO2 97% Wt Readings from Last 3 Encounters:  06/26/15 273 lb 4 oz (123.945 kg)  08/08/14 248 lb 4 oz (112.605 kg)  03/27/14 267 lb 3.2 oz (121.201 kg)     Lab Results  Component Value Date   WBC 8.0 06/26/2015   HGB 15.3 06/26/2015   HCT 45.9 06/26/2015   PLT 224.0 06/26/2015   GLUCOSE 77 06/26/2015   CHOL 107 06/26/2015   TRIG 147.0 06/26/2015   HDL 35.70* 06/26/2015   LDLCALC 42 06/26/2015   ALT 18 06/26/2015   AST 23 06/26/2015   NA 139 06/26/2015   K 4.5 06/26/2015   CL 101 06/26/2015   CREATININE 1.35 06/26/2015   BUN 21 06/26/2015   CO2 31 06/26/2015   TSH 1.40 06/26/2015   PSA 1.34  12/07/2012   HGBA1C 5.6 06/26/2015    Lab Results  Component Value Date   TSH 1.40 06/26/2015   Lab Results  Component Value Date   WBC 8.0 06/26/2015   HGB 15.3 06/26/2015   HCT 45.9 06/26/2015   MCV 87.3 06/26/2015   PLT 224.0 06/26/2015   Lab Results  Component Value Date   NA 139 06/26/2015   K 4.5 06/26/2015   CO2 31 06/26/2015   GLUCOSE  77 06/26/2015   BUN 21 06/26/2015   CREATININE 1.35 06/26/2015   BILITOT 0.7 06/26/2015   ALKPHOS 49 06/26/2015   AST 23 06/26/2015   ALT 18 06/26/2015   PROT 7.0 06/26/2015   ALBUMIN 4.3 06/26/2015   CALCIUM 9.6 06/26/2015   GFR 58.81* 06/26/2015   Lab Results  Component Value Date   CHOL 107 06/26/2015   Lab Results  Component Value Date   HDL 35.70* 06/26/2015   Lab Results  Component Value Date   LDLCALC 42 06/26/2015   Lab Results  Component Value Date   TRIG 147.0 06/26/2015   Lab Results  Component Value Date   CHOLHDL 3 06/26/2015   Lab Results  Component Value Date   HGBA1C 5.6 06/26/2015       Assessment & Plan:   Problem List Items Addressed This Visit    Preventative health care    Patient encouraged to maintain heart healthy diet, regular exercise, adequate sleep. Consider daily probiotics. Take medications as prescribed. Given and reviewed copy of ACP documents from [employer] and encouraged to complete and return      Obesity    Encouraged DASH diet, decrease po intake and increase exercise as tolerated. Needs 7-8 hours of sleep nightly. Avoid trans fats, eat small, frequent meals every 4-5 hours with lean proteins, complex carbs and healthy fats. Minimize simple carbs.       Hyperlipidemia, mixed    Encouraged heart healthy diet, increase exercise, avoid trans fats, consider a krill oil cap daily      Relevant Medications   nebivolol (BYSTOLIC) 10 MG tablet   lisinopril (PRINIVIL,ZESTRIL) 40 MG tablet   atorvastatin (LIPITOR) 20 MG tablet   sildenafil (REVATIO) 20 MG tablet     Other Relevant Orders   CBC (Completed)   TSH (Completed)   Hemoglobin A1c (Completed)   Lipid panel (Completed)   Comprehensive metabolic panel (Completed)   Hyperglycemia - Primary    hgba1c acceptable minimize simple carbs. Increase exercise as tolerated.       Relevant Orders   CBC (Completed)   TSH (Completed)   Hemoglobin A1c (Completed)   Lipid panel (Completed)   Comprehensive metabolic panel (Completed)   High risk sexual behavior   Relevant Orders   CBC (Completed)   HIV antibody (with reflex) (Completed)   RPR (Completed)   Urine cytology ancillary only (Completed)   GERD    Avoid offending foods, start probiotics. Do not eat large meals in late evening and consider raising head of bed.       Essential hypertension    Well controlled, no changes to meds. Encouraged heart healthy diet such as the DASH diet and exercise as tolerated.       Relevant Medications   nebivolol (BYSTOLIC) 10 MG tablet   lisinopril (PRINIVIL,ZESTRIL) 40 MG tablet   atorvastatin (LIPITOR) 20 MG tablet   sildenafil (REVATIO) 20 MG tablet   Other Relevant Orders   CBC (Completed)   TSH (Completed)   Hemoglobin A1c (Completed)   Lipid panel (Completed)   Comprehensive metabolic panel (Completed)    Other Visit Diagnoses    Morbid obesity, unspecified obesity type ([place])        Relevant Orders    CBC (Completed)    TSH (Completed)    Hemoglobin A1c (Completed)    Lipid panel (Completed)    Comprehensive metabolic panel (Completed)       I am having Mr. Riddick maintain his fluticasone, albuterol,  metroNIDAZOLE, nebivolol, lisinopril, allopurinol, atorvastatin, and sildenafil.  Meds ordered this encounter  Medications  . nebivolol (BYSTOLIC) 10 MG tablet    Sig: Take 1 tablet (10 mg total) by mouth daily.    Dispense:  90 tablet    Refill:  3  . lisinopril (PRINIVIL,ZESTRIL) 40 MG tablet    Sig: Take 1 tablet (40 mg total) by mouth daily.    Dispense:  90 tablet     Refill:  3  . allopurinol (ZYLOPRIM) 100 MG tablet    Sig: TAKE 1 TABLET (100 MG TOTAL) BY MOUTH DAILY.    Dispense:  90 tablet    Refill:  3  . atorvastatin (LIPITOR) 20 MG tablet    Sig: Take 1 tablet (20 mg total) by mouth daily.    Dispense:  90 tablet    Refill:  3  . sildenafil (REVATIO) 20 MG tablet    Sig: TAKE 2 TO 3 TABLETS BY MOUTH DAILY AS NEEDED FORED    Dispense:  40 tablet    Refill:  5     Penni Homans, MD

## 2015-07-02 LAB — URINE CYTOLOGY ANCILLARY ONLY
Bacterial vaginitis: NEGATIVE
Candida vaginitis: NEGATIVE

## 2015-08-03 MED FILL — BYSTOLIC 10 MG TABLET: 10 | 30 days supply | Qty: 30 | Fill #1

## 2015-08-03 MED FILL — ATORVASTATIN 20 MG TABLET: 20 | 30 days supply | Qty: 30 | Fill #1

## 2015-08-03 MED FILL — LISINOPRIL 40 MG TABLET: 40 | 30 days supply | Qty: 30 | Fill #1

## 2015-08-03 MED FILL — ALLOPURINOL 100 MG TABLET: 100 | 30 days supply | Qty: 30 | Fill #1

## 2015-09-04 MED FILL — ATORVASTATIN 20 MG TABLET: 20 | 30 days supply | Qty: 30 | Fill #2

## 2015-09-04 MED FILL — LISINOPRIL 40 MG TABLET: 40 | 30 days supply | Qty: 30 | Fill #2

## 2015-09-04 MED FILL — ALLOPURINOL 100 MG TABLET: 100 | 30 days supply | Qty: 30 | Fill #2

## 2015-09-04 MED FILL — BYSTOLIC 10 MG TABLET: 10 | 30 days supply | Qty: 30 | Fill #2

## 2015-10-04 MED FILL — ATORVASTATIN 20 MG TABLET: 20 | 30 days supply | Qty: 30 | Fill #3

## 2015-10-04 MED FILL — ALLOPURINOL 100 MG TABLET: 100 | 30 days supply | Qty: 30 | Fill #3

## 2015-10-04 MED FILL — LISINOPRIL 40 MG TABLET: 40 | 30 days supply | Qty: 30 | Fill #3

## 2015-10-04 MED FILL — BYSTOLIC 10 MG TABLET: 10 | 30 days supply | Qty: 30 | Fill #3

## 2015-11-09 MED FILL — ATORVASTATIN 20 MG TABLET: 20 | 30 days supply | Qty: 30 | Fill #4

## 2015-11-09 MED FILL — LISINOPRIL 40 MG TABLET: 40 | 30 days supply | Qty: 30 | Fill #4

## 2015-11-09 MED FILL — BYSTOLIC 10 MG TABLET: 10 | 30 days supply | Qty: 30 | Fill #4

## 2015-11-09 MED FILL — ALLOPURINOL 100 MG TABLET: 100 | 30 days supply | Qty: 30 | Fill #4

## 2015-12-17 MED FILL — LISINOPRIL 40 MG TABLET: 40 | 30 days supply | Qty: 30 | Fill #5

## 2015-12-17 MED FILL — BYSTOLIC 10 MG TABLET: 10 | 30 days supply | Qty: 30 | Fill #5

## 2015-12-17 MED FILL — ATORVASTATIN 20 MG TABLET: 20 | 30 days supply | Qty: 30 | Fill #5

## 2015-12-17 MED FILL — ALLOPURINOL 100 MG TABLET: 100 | 30 days supply | Qty: 30 | Fill #5

## 2015-12-27 ENCOUNTER — Other Ambulatory Visit (HOSPITAL_COMMUNITY)
Admission: RE | Admit: 2015-12-27 | Discharge: 2015-12-27 | Disposition: A | Discharge status: Home / Self Care | Payer: 59 | Source: Ambulatory Visit | Attending: Family Medicine | Admitting: Family Medicine

## 2015-12-27 ENCOUNTER — Ambulatory Visit (INDEPENDENT_AMBULATORY_CARE_PROVIDER_SITE_OTHER): Payer: 59 | Admitting: Family Medicine

## 2015-12-27 ENCOUNTER — Telehealth: Payer: Self-pay

## 2015-12-27 ENCOUNTER — Encounter: Payer: Self-pay | Admitting: Family Medicine

## 2015-12-27 VITALS — BP 126/70 | HR 61 | Temp 98.5°F | Wt 283.6 lb

## 2015-12-27 DIAGNOSIS — I10 Essential (primary) hypertension: Secondary | ICD-10-CM | POA: Diagnosis not present

## 2015-12-27 DIAGNOSIS — Z113 Encounter for screening for infections with a predominantly sexual mode of transmission: Secondary | ICD-10-CM | POA: Insufficient documentation

## 2015-12-27 DIAGNOSIS — R739 Hyperglycemia, unspecified: Secondary | ICD-10-CM

## 2015-12-27 DIAGNOSIS — E782 Mixed hyperlipidemia: Secondary | ICD-10-CM | POA: Diagnosis not present

## 2015-12-27 DIAGNOSIS — K219 Gastro-esophageal reflux disease without esophagitis: Secondary | ICD-10-CM

## 2015-12-27 DIAGNOSIS — Z7251 High risk heterosexual behavior: Secondary | ICD-10-CM

## 2015-12-27 DIAGNOSIS — R300 Dysuria: Secondary | ICD-10-CM | POA: Insufficient documentation

## 2015-12-27 DIAGNOSIS — M1000 Idiopathic gout, unspecified site: Secondary | ICD-10-CM | POA: Diagnosis not present

## 2015-12-27 LAB — COMPREHENSIVE METABOLIC PANEL
ALK PHOS: 54 U/L (ref 39–117)
ALT: 19 U/L (ref 0–53)
AST: 24 U/L (ref 0–37)
Albumin: 4.3 g/dL (ref 3.5–5.2)
BILIRUBIN TOTAL: 0.7 mg/dL (ref 0.2–1.2)
BUN: 22 mg/dL (ref 6–23)
CALCIUM: 9.6 mg/dL (ref 8.4–10.5)
CO2: 31 mEq/L (ref 19–32)
Chloride: 103 mEq/L (ref 96–112)
Creatinine, Ser: 1.37 mg/dL (ref 0.40–1.50)
GFR: 57.71 mL/min — ABNORMAL LOW (ref >60.00)
GLUCOSE: 103 mg/dL — AB (ref 70–99)
Potassium: 4.5 mEq/L (ref 3.5–5.1)
Sodium: 139 mEq/L (ref 135–145)
TOTAL PROTEIN: 7.2 g/dL (ref 6.0–8.3)

## 2015-12-27 LAB — POC URINALSYSI DIPSTICK (AUTOMATED)
Bilirubin, UA: NEGATIVE
Glucose, UA: NEGATIVE
Ketones, UA: NEGATIVE
Leukocytes, UA: NEGATIVE
Nitrite, UA: NEGATIVE
PROTEIN UA: NEGATIVE
RBC UA: NEGATIVE
UROBILINOGEN UA: 4
pH, UA: 6

## 2015-12-27 LAB — LIPID PANEL
Cholesterol: 134 mg/dL (ref 0–200)
HDL: 35.7 mg/dL — AB (ref >39.00)
LDL Cholesterol: 58 mg/dL (ref 0–99)
NONHDL: 98.22
Total CHOL/HDL Ratio: 4
Triglycerides: 199 mg/dL — ABNORMAL HIGH (ref 0.0–149.0)
VLDL: 39.8 mg/dL (ref 0.0–40.0)

## 2015-12-27 LAB — CBC
HCT: 47.4 % (ref 39.0–52.0)
HEMOGLOBIN: 16.1 g/dL (ref 13.0–17.0)
MCHC: 34 g/dL (ref 30.0–36.0)
MCV: 86.2 fl (ref 78.0–100.0)
PLATELETS: 233 10*3/uL (ref 150.0–400.0)
RBC: 5.5 Mil/uL (ref 4.22–5.81)
RDW: 13 % (ref 11.5–15.5)
WBC: 6.6 10*3/uL (ref 4.0–10.5)

## 2015-12-27 LAB — HEMOGLOBIN A1C: HEMOGLOBIN A1C: 5.7 % (ref 4.6–6.5)

## 2015-12-27 LAB — URIC ACID: Uric Acid, Serum: 6.5 mg/dL (ref 4.0–7.8)

## 2015-12-27 LAB — TSH: TSH: 1.33 u[IU]/mL (ref 0.35–4.50)

## 2015-12-27 MED ORDER — LISINOPRIL 40 MG PO TABS: 40.0000 mg | ORAL_TABLET | Freq: Every day | ORAL | 3 refills | Status: DC

## 2015-12-27 MED ORDER — ATORVASTATIN CALCIUM 20 MG PO TABS: 20.0000 mg | ORAL_TABLET | Freq: Every day | ORAL | 3 refills | Status: DC

## 2015-12-27 MED ORDER — SILDENAFIL CITRATE 20 MG PO TABS: ORAL_TABLET | ORAL | 5 refills | Status: DC

## 2015-12-27 MED ORDER — NEBIVOLOL HCL 10 MG PO TABS: 10.0000 mg | ORAL_TABLET | Freq: Every day | ORAL | 3 refills | Status: DC

## 2015-12-27 MED ORDER — ALLOPURINOL 100 MG PO TABS: ORAL_TABLET | ORAL | 3 refills | Status: DC

## 2015-12-27 NOTE — Assessment & Plan Note (Addendum)
Check STDs secondary to some persistent discomfort. He denies discharge but endorses a sense of tingling. Has had new sexual partners. Testing unremarkable today encouraged barrier method

## 2015-12-27 NOTE — Assessment & Plan Note (Signed)
Refill given on Revatio

## 2015-12-27 NOTE — Assessment & Plan Note (Signed)
Check uric acid today. Continue Allopurinol.

## 2015-12-27 NOTE — Assessment & Plan Note (Signed)
Well controlled, no changes to meds. Encouraged heart healthy diet such as the DASH diet and exercise as tolerated.  °

## 2015-12-27 NOTE — Patient Instructions (Signed)
Dysuria Introduction Dysuria is pain or discomfort while urinating. The pain or discomfort may be felt in the tube that carries urine out of the bladder (urethra) or in the surrounding tissue of the genitals. The pain may also be felt in the groin area, lower abdomen, and lower back. You may have to urinate frequently or have the sudden feeling that you have to urinate (urgency). Dysuria can affect both men and women, but is more common in women. Dysuria can be caused by many different things, including:  Urinary tract infection in women.  Infection of the kidney or bladder.  Kidney stones or bladder stones.  Certain sexually transmitted infections (STIs), such as chlamydia.  Dehydration.  Inflammation of the vagina.  Use of certain medicines.  Use of certain soaps or scented products that cause irritation. Follow these instructions at home: Watch your dysuria for any changes. The following actions may help to reduce any discomfort you are feeling:  Drink enough fluid to keep your urine clear or pale yellow.  Empty your bladder often. Avoid holding urine for long periods of time.  After a bowel movement or urination, women should cleanse from front to back, using each tissue only once.  Empty your bladder after sexual intercourse.  Take medicines only as directed by your health care provider.  If you were prescribed an antibiotic medicine, finish it all even if you start to feel better.  Avoid caffeine, tea, and alcohol. They can irritate the bladder and make dysuria worse. In men, alcohol may irritate the prostate.  Keep all follow-up visits as directed by your health care provider. This is important.  If you had any tests done to find the cause of dysuria, it is your responsibility to obtain your test results. Ask the lab or department performing the test when and how you will get your results. Talk with your health care provider if you have any questions about your  results. Contact a health care provider if:  You develop pain in your back or sides.  You have a fever.  You have nausea or vomiting.  You have blood in your urine.  You are not urinating as often as you usually do. Get help right away if:  You pain is severe and not relieved with medicines.  You are unable to hold down any fluids.  You or someone else notices a change in your mental function.  You have a rapid heartbeat at rest.  You have shaking or chills.  You feel extremely weak. This information is not intended to replace advice given to you by your health care provider. Make sure you discuss any questions you have with your health care provider. Document Released: 09/21/2003 Document Revised: 05/31/2015 Document Reviewed: 08/18/2013  2017 Elsevier  

## 2015-12-27 NOTE — Assessment & Plan Note (Signed)
hgba1c acceptable, minimize simple carbs. Increase exercise as tolerated.  

## 2015-12-27 NOTE — Progress Notes (Signed)
Patient ID: Barry Anthony, male   DOB: 1960-09-11, 55 y.o.   MRN: [id number]   Subjective:    Patient ID: Barry Anthony, male    DOB: 09-Mar-1960, 55 y.o.   MRN: [id number]  Chief Complaint  Patient presents with  . SEXUALLY TRANSMITTED DISEASE    mild discomfort, some pain when he urinates.     HPI Patient is in today for evaluation of dysuria. He describes no penile discharge but an uncomfortable tingling sensation for past couple of weeks. No fevers, malaise, abdominal pain or urinary frequency. Denies CP/palp/SOB/HA/congestion/fevers/GI  c/o. Taking meds as prescribed  Past Medical History:  Diagnosis Date  . Allergic rhinitis   . Allergy   . Bilateral hydrocele 12/08/2012  . GERD (gastroesophageal reflux disease)   . History of chicken pox   . Hyperglycemia 08/15/2008   Qualifier: Diagnosis of  By: Wynona Luna    . Hyperlipidemia   . Hypertension   . Lipoma of shoulder   . Preventative health care 02/12/2014    Past Surgical History:  Procedure Laterality Date  . EYE SURGERY    . HERNIA REPAIR     groin  . KNEE SURGERY  1981   right knee  . TONSILLECTOMY  1969  . WISDOM TOOTH EXTRACTION      Family History  Problem Relation Age of Onset  . Thyroid cancer Father   . Hypertension Father   . Coronary artery disease Father   . Hyperlipidemia Father   . Alcohol abuse Father     smoker  . Heart disease Father   . Other      no colon cancer, no prostate canceer  . Hypothyroidism Mother   . Dementia Maternal Aunt   . Dementia Maternal Uncle   . Cancer Maternal Grandmother     breast  . Heart disease Paternal Grandmother     heart disease  . Colon cancer Neg Hx     Social History   Social History  . Marital status: Married    Spouse name: N/A  . Number of children: N/A  . Years of education: N/A   Occupational History  . Not on file.   Social History Main Topics  . Smoking status: Never Smoker  . Smokeless tobacco: Never Used  . Alcohol use Yes     Comment: 2 drinks / week  . Drug use: No  . Sexual activity: Yes     Comment: lives with wife, works as a Public house manager at [employer]. no major dietary restrictions   Other Topics Concern  . Not on file   Social History Narrative   Occupation:  Parts mgr   Married 16 yrs    2 children  10, 12    Never Smoked    Alcohol use-yes (social)     Outpatient Medications Prior to Visit  Medication Sig Dispense Refill  . albuterol (PROAIR HFA) 108 (90 BASE) MCG/ACT inhaler INHALE 2 PUFFS INTO THE LUNGS DAILY AS NEEDED 8.5 g 3  . fluticasone (FLONASE) 50 MCG/ACT nasal spray Place 2 sprays into both nostrils daily as needed. 48 g 3  . metroNIDAZOLE (FLAGYL) 500 MG tablet Take 1 tablet (500 mg total) by mouth 2 (two) times daily. 14 tablet 0  . allopurinol (ZYLOPRIM) 100 MG tablet TAKE 1 TABLET (100 MG TOTAL) BY MOUTH DAILY. 90 tablet 3  . atorvastatin (LIPITOR) 20 MG tablet Take 1 tablet (20 mg total) by mouth daily. 90 tablet 3  .  lisinopril (PRINIVIL,ZESTRIL) 40 MG tablet Take 1 tablet (40 mg total) by mouth daily. 90 tablet 3  . nebivolol (BYSTOLIC) 10 MG tablet Take 1 tablet (10 mg total) by mouth daily. 90 tablet 3  . sildenafil (REVATIO) 20 MG tablet TAKE 2 TO 3 TABLETS BY MOUTH DAILY AS NEEDED FORED 40 tablet 5   No facility-administered medications prior to visit.     No Known Allergies  Review of Systems  Constitutional: Negative for fever and malaise/fatigue.  HENT: Negative for congestion.   Eyes: Negative for blurred vision.  Respiratory: Negative for cough and shortness of breath.   Cardiovascular: Negative for chest pain, palpitations and leg swelling.  Gastrointestinal: Negative for vomiting.  Genitourinary: Positive for dysuria. Negative for frequency and urgency.  Musculoskeletal: Negative for back pain.  Skin: Negative for rash.  Neurological: Negative for loss of consciousness and headaches.       Objective:    Physical Exam  Constitutional: He is  oriented to person, place, and time. He appears well-developed and well-nourished. No distress.  HENT:  Head: Normocephalic and atraumatic.  Eyes: Conjunctivae are normal.  Neck: Normal range of motion. No thyromegaly present.  Cardiovascular: Normal rate and regular rhythm.   Pulmonary/Chest: Effort normal and breath sounds normal. He has no wheezes.  Abdominal: Soft. Bowel sounds are normal. There is no tenderness.  Musculoskeletal: Normal range of motion. He exhibits no edema or deformity.  Neurological: He is alert and oriented to person, place, and time.  Skin: Skin is warm and dry. He is not diaphoretic.  Psychiatric: He has a normal mood and affect.    BP 126/70 (BP Location: Left Arm, Patient Position: Sitting, Cuff Size: Normal)   Pulse 61   Temp 98.5 F (36.9 C) (Oral)   Wt 283 lb 9.6 oz (128.6 kg)   SpO2 94%   BMI 39.55 kg/m  Wt Readings from Last 3 Encounters:  12/27/15 283 lb 9.6 oz (128.6 kg)  06/26/15 273 lb 4 oz (123.9 kg)  08/08/14 248 lb 4 oz (112.6 kg)     Lab Results  Component Value Date   WBC 6.6 12/27/2015   HGB 16.1 12/27/2015   HCT 47.4 12/27/2015   PLT 233.0 12/27/2015   GLUCOSE 103 (H) 12/27/2015   CHOL 134 12/27/2015   TRIG 199.0 (H) 12/27/2015   HDL 35.70 (L) 12/27/2015   LDLCALC 58 12/27/2015   ALT 19 12/27/2015   AST 24 12/27/2015   NA 139 12/27/2015   K 4.5 12/27/2015   CL 103 12/27/2015   CREATININE 1.37 12/27/2015   BUN 22 12/27/2015   CO2 31 12/27/2015   TSH 1.33 12/27/2015   PSA 1.34 12/07/2012   HGBA1C 5.7 12/27/2015    Lab Results  Component Value Date   TSH 1.33 12/27/2015   Lab Results  Component Value Date   WBC 6.6 12/27/2015   HGB 16.1 12/27/2015   HCT 47.4 12/27/2015   MCV 86.2 12/27/2015   PLT 233.0 12/27/2015   Lab Results  Component Value Date   NA 139 12/27/2015   K 4.5 12/27/2015   CO2 31 12/27/2015   GLUCOSE 103 (H) 12/27/2015   BUN 22 12/27/2015   CREATININE 1.37 12/27/2015   BILITOT 0.7  12/27/2015   ALKPHOS 54 12/27/2015   AST 24 12/27/2015   ALT 19 12/27/2015   PROT 7.2 12/27/2015   ALBUMIN 4.3 12/27/2015   CALCIUM 9.6 12/27/2015   GFR 57.71 (L) 12/27/2015   Lab Results  Component Value  Date   CHOL 134 12/27/2015   Lab Results  Component Value Date   HDL 35.70 (L) 12/27/2015   Lab Results  Component Value Date   LDLCALC 58 12/27/2015   Lab Results  Component Value Date   TRIG 199.0 (H) 12/27/2015   Lab Results  Component Value Date   CHOLHDL 4 12/27/2015   Lab Results  Component Value Date   HGBA1C 5.7 12/27/2015   I acted as a Education administrator for Dr. Charlett Blake. Princess, RMA     Assessment & Plan:   Problem List Items Addressed This Visit    Hyperlipidemia, mixed   Relevant Medications   lisinopril (PRINIVIL,ZESTRIL) 40 MG tablet   atorvastatin (LIPITOR) 20 MG tablet   nebivolol (BYSTOLIC) 10 MG tablet   sildenafil (REVATIO) 20 MG tablet   Other Relevant Orders   Lipid panel (Completed)   Essential hypertension    Well controlled, no changes to meds. Encouraged heart healthy diet such as the DASH diet and exercise as tolerated.       Relevant Medications   lisinopril (PRINIVIL,ZESTRIL) 40 MG tablet   atorvastatin (LIPITOR) 20 MG tablet   nebivolol (BYSTOLIC) 10 MG tablet   sildenafil (REVATIO) 20 MG tablet   Other Relevant Orders   CBC (Completed)   Comprehensive metabolic panel (Completed)   TSH (Completed)   GERD   Hyperglycemia    hgba1c acceptable, minimize simple carbs. Increase exercise as tolerated.       Relevant Orders   Hemoglobin A1c (Completed)   Gout    Check uric acid today. Continue Allopurinol.       Relevant Orders   Uric acid (Completed)   High risk sexual behavior    Check STDs secondary to some persistent discomfort. He denies discharge but endorses a sense of tingling. Has had new sexual partners. Testing unremarkable today encouraged barrier method       Relevant Orders   HSV Type I/II IgG, IgMw/ reflex  (Completed)   Dysuria - Primary   Relevant Orders   POCT Urinalysis Dipstick (Automated) (Completed)   Urine culture (Completed)   Urine cytology ancillary only (Completed)   HSV Type I/II IgG, IgMw/ reflex (Completed)      I am having Mr. Tagert maintain his fluticasone, albuterol, metroNIDAZOLE, lisinopril, allopurinol, atorvastatin, nebivolol, and sildenafil.  Meds ordered this encounter  Medications  . lisinopril (PRINIVIL,ZESTRIL) 40 MG tablet    Sig: Take 1 tablet (40 mg total) by mouth daily.    Dispense:  90 tablet    Refill:  3  . allopurinol (ZYLOPRIM) 100 MG tablet    Sig: TAKE 1 TABLET (100 MG TOTAL) BY MOUTH DAILY.    Dispense:  90 tablet    Refill:  3  . atorvastatin (LIPITOR) 20 MG tablet    Sig: Take 1 tablet (20 mg total) by mouth daily.    Dispense:  90 tablet    Refill:  3  . nebivolol (BYSTOLIC) 10 MG tablet    Sig: Take 1 tablet (10 mg total) by mouth daily.    Dispense:  90 tablet    Refill:  3  . sildenafil (REVATIO) 20 MG tablet    Sig: TAKE 2 TO 3 TABLETS BY MOUTH DAILY AS NEEDED FORED    Dispense:  40 tablet    Refill:  5   CMA served as scribe during this visit. History, Physical and Plan performed by medical provider. Documentation and orders reviewed and attested to.  Penni Homans, MD

## 2015-12-27 NOTE — Progress Notes (Signed)
Pre visit review using our clinic review tool, if applicable. No additional management support is needed unless otherwise documented below in the visit note. 
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
## 2015-12-28 LAB — URINE CYTOLOGY ANCILLARY ONLY
CHLAMYDIA, DNA PROBE: NEGATIVE
Neisseria Gonorrhea: NEGATIVE
Trichomonas: NEGATIVE

## 2015-12-28 LAB — URINE CULTURE: ORGANISM ID, BACTERIA: NO GROWTH

## 2016-01-01 LAB — HSV TYPE I/II IGG, IGMW/ REFLEX
HSV 1 IGM SCREEN: NEGATIVE
HSV 2 IgM Screen: NEGATIVE

## 2016-01-02 LAB — URINE CYTOLOGY ANCILLARY ONLY
BACTERIAL VAGINITIS: NEGATIVE
CANDIDA VAGINITIS: NEGATIVE

## 2016-01-03 NOTE — Telephone Encounter (Signed)
Called [employer] at [phone] regarding status of PA. PA denied. Sildenafil can be covered if patient meets following criteria: All of the following: A) Pulmonary arterial hypertension B) Diagnosis of pulmonary arterial hypertension confirmed by right heart cath C) Medication prescribed or in consultation with one of the following: cardiologist, rheumatologist, pulmonologist.  Denial letter sent for scanning.

## 2016-01-03 NOTE — Telephone Encounter (Signed)
Spoke w/ Suezanne Jacquet at [facility], informed of PA denial.

## 2016-01-17 MED FILL — BYSTOLIC 10 MG TABLET: 10 | 30 days supply | Qty: 30 | Fill #6

## 2016-01-17 MED FILL — ALLOPURINOL 100 MG TABLET: 100 | 30 days supply | Qty: 30 | Fill #6

## 2016-01-17 MED FILL — ATORVASTATIN 20 MG TABLET: 20 | 30 days supply | Qty: 30 | Fill #6

## 2016-01-17 MED FILL — LISINOPRIL 40 MG TABLET: 40 | 30 days supply | Qty: 30 | Fill #6

## 2016-02-21 MED FILL — BYSTOLIC 10 MG TABLET: 10 | 30 days supply | Qty: 30 | Fill #7

## 2016-02-21 MED FILL — LISINOPRIL 40 MG TABLET: 40 | 30 days supply | Qty: 30 | Fill #7

## 2016-02-21 MED FILL — ALLOPURINOL 100 MG TABLET: 100 | 30 days supply | Qty: 30 | Fill #7

## 2016-02-21 MED FILL — ATORVASTATIN 20 MG TABLET: 20 | 30 days supply | Qty: 30 | Fill #7

## 2016-03-25 MED FILL — LISINOPRIL 40 MG TABLET: 40 | 30 days supply | Qty: 30 | Fill #8

## 2016-03-25 MED FILL — ATORVASTATIN 20 MG TABLET: 20 | 30 days supply | Qty: 30 | Fill #8

## 2016-03-25 MED FILL — ALLOPURINOL 100 MG TABLET: 100 | 30 days supply | Qty: 30 | Fill #8

## 2016-03-25 MED FILL — BYSTOLIC 10 MG TABLET: 10 | 30 days supply | Qty: 30 | Fill #8

## 2016-04-25 MED FILL — ALLOPURINOL 100 MG TABLET: 100 | 30 days supply | Qty: 30 | Fill #9

## 2016-04-25 MED FILL — ATORVASTATIN 20 MG TABLET: 20 | 30 days supply | Qty: 30 | Fill #9

## 2016-04-25 MED FILL — BYSTOLIC 10 MG TABLET: 10 | 30 days supply | Qty: 30 | Fill #9

## 2016-04-25 MED FILL — LISINOPRIL 40 MG TABLET: 40 | 30 days supply | Qty: 30 | Fill #9

## 2016-05-29 MED FILL — ALLOPURINOL 100 MG TABLET: 100 | 30 days supply | Qty: 30 | Fill #10

## 2016-05-29 MED FILL — LISINOPRIL 40 MG TABLET: 40 | 30 days supply | Qty: 30 | Fill #10

## 2016-05-29 MED FILL — ATORVASTATIN 20 MG TABLET: 20 | 30 days supply | Qty: 30 | Fill #10

## 2016-05-29 MED FILL — BYSTOLIC 10 MG TABLET: 10 | 30 days supply | Qty: 30 | Fill #10

## 2016-06-04 ENCOUNTER — Observation Stay (HOSPITAL_COMMUNITY)
Admission: EM | Admit: 2016-06-04 | Discharge: 2016-06-06 | Disposition: A | Discharge status: Home / Self Care | Payer: 59 | Attending: Surgery | Admitting: Surgery

## 2016-06-04 ENCOUNTER — Encounter (HOSPITAL_COMMUNITY): Payer: Self-pay | Admitting: Emergency Medicine

## 2016-06-04 ENCOUNTER — Emergency Department (HOSPITAL_COMMUNITY): Payer: 59

## 2016-06-04 DIAGNOSIS — Y92410 Unspecified street and highway as the place of occurrence of the external cause: Secondary | ICD-10-CM | POA: Diagnosis not present

## 2016-06-04 DIAGNOSIS — S82852A Displaced trimalleolar fracture of left lower leg, initial encounter for closed fracture: Secondary | ICD-10-CM | POA: Diagnosis not present

## 2016-06-04 DIAGNOSIS — I10 Essential (primary) hypertension: Secondary | ICD-10-CM | POA: Insufficient documentation

## 2016-06-04 DIAGNOSIS — S82842A Displaced bimalleolar fracture of left lower leg, initial encounter for closed fracture: Secondary | ICD-10-CM

## 2016-06-04 DIAGNOSIS — T148XXA Other injury of unspecified body region, initial encounter: Secondary | ICD-10-CM | POA: Diagnosis present

## 2016-06-04 DIAGNOSIS — Z79899 Other long term (current) drug therapy: Secondary | ICD-10-CM | POA: Insufficient documentation

## 2016-06-04 DIAGNOSIS — K219 Gastro-esophageal reflux disease without esophagitis: Secondary | ICD-10-CM | POA: Diagnosis not present

## 2016-06-04 DIAGNOSIS — S62631A Displaced fracture of distal phalanx of left index finger, initial encounter for closed fracture: Secondary | ICD-10-CM | POA: Diagnosis present

## 2016-06-04 DIAGNOSIS — S62641A Nondisplaced fracture of proximal phalanx of left index finger, initial encounter for closed fracture: Secondary | ICD-10-CM | POA: Insufficient documentation

## 2016-06-04 DIAGNOSIS — S2222XA Fracture of body of sternum, initial encounter for closed fracture: Principal | ICD-10-CM | POA: Insufficient documentation

## 2016-06-04 DIAGNOSIS — S82843A Displaced bimalleolar fracture of unspecified lower leg, initial encounter for closed fracture: Secondary | ICD-10-CM | POA: Diagnosis present

## 2016-06-04 LAB — I-STAT CHEM 8, ED
BUN: 25 mg/dL — ABNORMAL HIGH (ref 6–20)
CALCIUM ION: 1.08 mmol/L — AB (ref 1.15–1.40)
CREATININE: 1.5 mg/dL — AB (ref 0.61–1.24)
Chloride: 102 mmol/L (ref 101–111)
GLUCOSE: 131 mg/dL — AB (ref 65–99)
HCT: 47 % (ref 39.0–52.0)
HEMOGLOBIN: 16 g/dL (ref 13.0–17.0)
Potassium: 4.1 mmol/L (ref 3.5–5.1)
Sodium: 139 mmol/L (ref 135–145)
TCO2: 26 mmol/L (ref 0–100)

## 2016-06-04 LAB — CBC
HEMATOCRIT: 46.1 % (ref 39.0–52.0)
Hemoglobin: 15.5 g/dL (ref 13.0–17.0)
MCH: 29.1 pg (ref 26.0–34.0)
MCHC: 33.6 g/dL (ref 30.0–36.0)
MCV: 86.5 fL (ref 78.0–100.0)
PLATELETS: 210 10*3/uL (ref 150–400)
RBC: 5.33 MIL/uL (ref 4.22–5.81)
RDW: 13.3 % (ref 11.5–15.5)
WBC: 16.2 10*3/uL — ABNORMAL HIGH (ref 4.0–10.5)

## 2016-06-04 MED ORDER — ACETAMINOPHEN 500 MG PO TABS
1000.0000 mg | ORAL_TABLET | Freq: Once | ORAL | Status: AC
  Administered 2016-06-04: 1000 mg via ORAL
  Filled 2016-06-04: qty 2

## 2016-06-04 MED ORDER — PROPOFOL 10 MG/ML IV BOLUS
INTRAVENOUS | Status: AC | PRN
  Administered 2016-06-04: 65 mg via INTRAVENOUS
  Administered 2016-06-04: 25 mg via INTRAVENOUS
  Administered 2016-06-04: 50 mg via INTRAVENOUS
  Administered 2016-06-05: 40 mg via INTRAVENOUS

## 2016-06-04 MED ORDER — PROPOFOL 10 MG/ML IV BOLUS
2.0000 mg/kg | Freq: Once | INTRAVENOUS | Status: DC
  Filled 2016-06-04: qty 40

## 2016-06-04 MED ORDER — SODIUM CHLORIDE 0.9 % IV BOLUS (SEPSIS)
1000.0000 mL | Freq: Once | INTRAVENOUS | Status: AC
  Administered 2016-06-04: 1000 mL via INTRAVENOUS

## 2016-06-04 MED ORDER — IBUPROFEN 400 MG PO TABS
600.0000 mg | ORAL_TABLET | Freq: Once | ORAL | Status: AC
  Administered 2016-06-04: 600 mg via ORAL
  Filled 2016-06-04: qty 1
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
## 2016-06-04 NOTE — Progress Notes (Signed)
Orthopedic Tech Progress Note Patient Details:  Barry Anthony [birth year] [id number]  Ortho Devices Type of Ortho Device: Finger splint Ortho Device/Splint Location: LUE index finger Ortho Device/Splint Interventions: Ordered, Application   Braulio Bosch 06/04/2016, 11:22 PM

## 2016-06-04 NOTE — ED Notes (Signed)
Patient transported to X-ray 

## 2016-06-05 ENCOUNTER — Emergency Department (HOSPITAL_COMMUNITY): Payer: 59

## 2016-06-05 ENCOUNTER — Observation Stay (HOSPITAL_COMMUNITY): Payer: 59

## 2016-06-05 ENCOUNTER — Encounter (HOSPITAL_COMMUNITY): Payer: Self-pay | Admitting: General Practice

## 2016-06-05 DIAGNOSIS — S2222XA Fracture of body of sternum, initial encounter for closed fracture: Secondary | ICD-10-CM | POA: Diagnosis not present

## 2016-06-05 DIAGNOSIS — S82843A Displaced bimalleolar fracture of unspecified lower leg, initial encounter for closed fracture: Secondary | ICD-10-CM | POA: Diagnosis present

## 2016-06-05 LAB — CBC
HEMATOCRIT: 44.2 % (ref 39.0–52.0)
Hemoglobin: 15.1 g/dL (ref 13.0–17.0)
MCH: 30 pg (ref 26.0–34.0)
MCHC: 34.2 g/dL (ref 30.0–36.0)
MCV: 87.9 fL (ref 78.0–100.0)
Platelets: 168 10*3/uL (ref 150–400)
RBC: 5.03 MIL/uL (ref 4.22–5.81)
RDW: 13.8 % (ref 11.5–15.5)
WBC: 10.4 10*3/uL (ref 4.0–10.5)

## 2016-06-05 LAB — CREATININE, SERUM
Creatinine, Ser: 1.35 mg/dL — ABNORMAL HIGH (ref 0.61–1.24)
GFR calc Af Amer: >60 mL/min (ref >60)
GFR calc non Af Amer: 58 mL/min — ABNORMAL LOW (ref >60)

## 2016-06-05 LAB — I-STAT TROPONIN, ED: Troponin i, poc: 0 ng/mL (ref 0.00–0.08)

## 2016-06-05 MED ORDER — ACETAMINOPHEN 325 MG PO TABS: 650.0000 mg | ORAL_TABLET | ORAL | Status: DC | PRN

## 2016-06-05 MED ORDER — ENOXAPARIN SODIUM 40 MG/0.4ML ~~LOC~~ SOLN
40.0000 mg | SUBCUTANEOUS | Status: DC
  Administered 2016-06-05 – 2016-06-06 (×2): 40 mg via SUBCUTANEOUS
  Filled 2016-06-05 (×3): qty 0.4

## 2016-06-05 MED ORDER — OXYCODONE HCL 5 MG PO TABS
5.0000 mg | ORAL_TABLET | Freq: Once | ORAL | Status: AC
  Administered 2016-06-05: 5 mg via ORAL
  Filled 2016-06-05: qty 1

## 2016-06-05 MED ORDER — OXYCODONE-ACETAMINOPHEN 5-325 MG PO TABS: 1.0000 | ORAL_TABLET | ORAL | 0 refills | Status: DC | PRN

## 2016-06-05 MED ORDER — POTASSIUM CHLORIDE IN NACL 20-0.9 MEQ/L-% IV SOLN
INTRAVENOUS | Status: DC
  Administered 2016-06-05: 09:00:00 via INTRAVENOUS
  Filled 2016-06-05: qty 1000

## 2016-06-05 MED ORDER — OXYCODONE-ACETAMINOPHEN 5-325 MG PO TABS: 1.0000 | ORAL_TABLET | Freq: Once | ORAL | Status: DC

## 2016-06-05 MED ORDER — FLUTICASONE PROPIONATE 50 MCG/ACT NA SUSP
2.0000 | Freq: Every day | NASAL | Status: DC | PRN
  Filled 2016-06-05: qty 16

## 2016-06-05 MED ORDER — ONDANSETRON HCL 4 MG PO TABS: 4.0000 mg | ORAL_TABLET | Freq: Four times a day (QID) | ORAL | Status: DC | PRN

## 2016-06-05 MED ORDER — ONDANSETRON 4 MG PO TBDP
4.0000 mg | ORAL_TABLET | Freq: Once | ORAL | Status: AC
  Administered 2016-06-05: 4 mg via ORAL
  Filled 2016-06-05: qty 1

## 2016-06-05 MED ORDER — ALLOPURINOL 100 MG PO TABS
100.0000 mg | ORAL_TABLET | Freq: Every day | ORAL | Status: DC
  Administered 2016-06-05 – 2016-06-06 (×2): 100 mg via ORAL
  Filled 2016-06-05 (×2): qty 1

## 2016-06-05 MED ORDER — NEBIVOLOL HCL 5 MG PO TABS
10.0000 mg | ORAL_TABLET | Freq: Every day | ORAL | Status: DC
  Administered 2016-06-05 – 2016-06-06 (×2): 10 mg via ORAL
  Filled 2016-06-05 (×2): qty 2

## 2016-06-05 MED ORDER — IOPAMIDOL (ISOVUE-300) INJECTION 61%
INTRAVENOUS | Status: AC
  Administered 2016-06-05: 100 mL
  Filled 2016-06-05: qty 100

## 2016-06-05 MED ORDER — ONDANSETRON HCL 4 MG/2ML IJ SOLN
4.0000 mg | Freq: Four times a day (QID) | INTRAMUSCULAR | Status: DC | PRN
  Administered 2016-06-05: 4 mg via INTRAVENOUS
  Filled 2016-06-05: qty 2

## 2016-06-05 MED ORDER — LISINOPRIL 40 MG PO TABS
40.0000 mg | ORAL_TABLET | Freq: Every day | ORAL | Status: DC
  Administered 2016-06-05 – 2016-06-06 (×2): 40 mg via ORAL
  Filled 2016-06-05 (×2): qty 1

## 2016-06-05 MED ORDER — OXYCODONE HCL 5 MG PO TABS: 5.0000 mg | ORAL_TABLET | Freq: Four times a day (QID) | ORAL | 0 refills | Status: DC | PRN

## 2016-06-05 MED ORDER — OXYCODONE HCL 5 MG PO TABS
10.0000 mg | ORAL_TABLET | ORAL | Status: DC | PRN
  Administered 2016-06-05 – 2016-06-06 (×5): 10 mg via ORAL
  Filled 2016-06-05 (×5): qty 2

## 2016-06-05 MED ORDER — ALBUTEROL SULFATE (2.5 MG/3ML) 0.083% IN NEBU: 2.5000 mg | INHALATION_SOLUTION | Freq: Four times a day (QID) | RESPIRATORY_TRACT | Status: DC | PRN

## 2016-06-05 MED ORDER — OXYCODONE HCL 5 MG PO TABS: 5.0000 mg | ORAL_TABLET | ORAL | Status: DC | PRN

## 2016-06-05 MED ORDER — ATORVASTATIN CALCIUM 20 MG PO TABS
20.0000 mg | ORAL_TABLET | Freq: Every day | ORAL | Status: DC
  Administered 2016-06-05 – 2016-06-06 (×2): 20 mg via ORAL
  Filled 2016-06-05 (×2): qty 1

## 2016-06-05 MED ORDER — HYDROMORPHONE HCL 1 MG/ML IJ SOLN: 1.0000 mg | INTRAMUSCULAR | Status: DC | PRN

## 2016-06-05 MED ORDER — IBUPROFEN 600 MG PO TABS: 600.0000 mg | ORAL_TABLET | Freq: Three times a day (TID) | ORAL | 0 refills | Status: DC | PRN
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
## 2016-06-05 NOTE — ED Provider Notes (Signed)
Patient seen and evaluated. Discussed with Dr. Lanetta Inch. X-rays reviewed and patient examined. He has a phalangeal finger fracture which is nondisplaced. This is splinted. He has a bimalleolar ankle fracture of the left leg. He has intact pulses and sensation. This is a closed injury. His skin is protected. Patient was discussed with Dr. Rolena Infante breath. He recommends reduction of fracture dislocation coaptation splint, nonweightbearing, follow up with his partner Dr. Doran Durand with strict elevation and return precautions.  Procedural sedation Performed by: Lolita Patella Consent: Verbal consent obtained. Risks and benefits: risks, benefits and alternatives were discussed Required items: required blood products, implants, devices, and special equipment available Patient identity confirmed: arm band and provided demographic data Time out: Immediately prior to procedure a "time out" was called to verify the correct patient, procedure, equipment, support staff and site/side marked as required.  Sedation type: moderate (conscious) sedation NPO time confirmed and considedered  Sedatives: PROPOFOL  Physician Time at Bedside: 20  Vitals: Vital signs were monitored during sedation. Cardiac Monitor, pulse oximeter Patient tolerance: Patient tolerated the procedure well with no immediate complications. Comments: Pt with uneventful recovered. Returned to pre-procedural sedation baseline  Reduction of dislocation Date/Time: 12:12 AM Performed by: Lolita Patella Authorized by: Lolita Patella Consent: Verbal consent obtained. Risks and benefits: risks, benefits and alternatives were discussed Consent given by: patient Required items: required blood products, implants, devices, and special equipment available Time out: Immediately prior to procedure a "time out" was called to verify the correct patient, procedure, equipment, support staff and site/side marked as required.  Patient sedated:  yes  Vitals: Vital signs were monitored during sedation. Patient tolerance: Patient tolerated the procedure well with no immediate complications. Joint: Left ankle Reduction technique: Eversion, traction, inversion.   Postprocedure x-ray showed proper reduction. The leg was splinted by myself assisted by orthopedic technician. Patient remains neurovascularly intact distally. He will be on crutches and nonweightbearing. Pain medication. Call orthopedics for follow-up appointment.   Tanna Furry, MD 06/05/16 [phone]

## 2016-06-05 NOTE — Discharge Instructions (Addendum)
Keep leg elevated as much as possible Apply ice to the ankle: 10 min on 20 min off to decrease swelling and pain Call for numbness, pain, or swelling  call or return to have any questions, new symptoms, change in symptoms, or symptoms that you do not understand.

## 2016-06-05 NOTE — Care Management (Signed)
ED CM received call from [place] on [facility] concerning patient needing equipment for discharge this evening, patient is needing a platform walker/w/c and 3n1. No orders were noted in record, CM explained it is after hours this will need to be address in the am once [facility] and DME orders have been obtained.  ED CM will handoff to Trauma CM to follow up in the am.

## 2016-06-05 NOTE — Progress Notes (Signed)
Orthopedic Tech Progress Note Patient Details:  Barry Anthony October 12, 1960 [id number]  Ortho Devices Type of Ortho Device: Stirrup splint, Post (short leg) splint, Crutches Ortho Device/Splint Location: lle Ortho Device/Splint Interventions: Ordered, Application   [name] 06/05/2016, 12:52 AM
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
## 2016-06-05 NOTE — Progress Notes (Signed)
Orthopedic Tech Progress Note Patient Details:  Barry Anthony Feb 10, 1960 [id number]  Ortho Devices Type of Ortho Device: Ace wrap, Arm sling, Rad Gutter splint Ortho Device/Splint Location: lue Ortho Device/Splint Interventions: Application   Jasn Xia 06/05/2016, 1:57 PM As ordered by Dr. Silvestre Gunner
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
## 2016-06-06 DIAGNOSIS — S2222XA Fracture of body of sternum, initial encounter for closed fracture: Secondary | ICD-10-CM | POA: Diagnosis not present

## 2016-06-06 MED FILL — oxyCODONE HCL 5 MG TABS: 5 | 5 days supply | Qty: 30 | Fill #0

## 2016-06-06 NOTE — Progress Notes (Signed)
Discharging to home. Patient was seen by Dr. Doran Durand last evening and worked with physical therapy. Home equipment was delivered (rolling walker, BSC, wheelchair with left leg rest.)Leaving room accompanied by spouse and Building control surveyor.
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
## 2016-06-10 MED FILL — METHOCARBAMOL 500 MG TABLET: 500 | 4 days supply | Qty: 30 | Fill #0

## 2016-06-10 MED FILL — oxyCODONE HCL 5 MG TABS: 5 | 3 days supply | Qty: 30 | Fill #0

## 2016-06-10 MED FILL — ASPIR-LOW EC 81 MG TABLET: 81 | 30 days supply | Qty: 60 | Fill #0

## 2016-07-03 MED FILL — ATORVASTATIN 20 MG TABLET: 20 | 30 days supply | Qty: 30 | Fill #0

## 2016-07-03 MED FILL — ALLOPURINOL 100 MG TABLET: 100 | 30 days supply | Qty: 30 | Fill #0

## 2016-07-03 MED FILL — LISINOPRIL 40 MG TAB: 40 | 30 days supply | Qty: 30 | Fill #0

## 2016-07-03 MED FILL — BYSTOLIC 10 MG TABLET: 10 | 30 days supply | Qty: 30 | Fill #0

## 2016-08-04 ENCOUNTER — Ambulatory Visit (INDEPENDENT_AMBULATORY_CARE_PROVIDER_SITE_OTHER): Payer: 59 | Admitting: Family Medicine

## 2016-08-04 ENCOUNTER — Encounter: Payer: Self-pay | Admitting: Family Medicine

## 2016-08-04 ENCOUNTER — Ambulatory Visit (HOSPITAL_BASED_OUTPATIENT_CLINIC_OR_DEPARTMENT_OTHER)
Admission: RE | Admit: 2016-08-04 | Discharge: 2016-08-04 | Disposition: A | Discharge status: Home / Self Care | Payer: 59 | Source: Ambulatory Visit | Attending: Family Medicine | Admitting: Family Medicine

## 2016-08-04 ENCOUNTER — Other Ambulatory Visit: Payer: Self-pay | Admitting: Family Medicine

## 2016-08-04 DIAGNOSIS — R739 Hyperglycemia, unspecified: Secondary | ICD-10-CM

## 2016-08-04 DIAGNOSIS — I10 Essential (primary) hypertension: Secondary | ICD-10-CM | POA: Diagnosis not present

## 2016-08-04 DIAGNOSIS — N50812 Left testicular pain: Secondary | ICD-10-CM

## 2016-08-04 DIAGNOSIS — N433 Hydrocele, unspecified: Secondary | ICD-10-CM | POA: Diagnosis not present

## 2016-08-04 HISTORY — DX: Left testicular pain: N50.812

## 2016-08-04 NOTE — Assessment & Plan Note (Signed)
hgba1c acceptable, minimize simple carbs. Increase exercise as tolerated.  

## 2016-08-04 NOTE — Assessment & Plan Note (Signed)
Well controlled, no changes to meds. Encouraged heart healthy diet such as the DASH diet and exercise as tolerated.  °
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
## 2016-08-04 NOTE — Patient Instructions (Signed)
Hydrocele, Adult A hydrocele is a collection of fluid in the loose pouch of skin that holds the testicles (scrotum). Usually, it affects only one testicle. What are the causes? This condition may be caused by:  An injury to the scrotum.  An infection.  A tumor or cancer of the testicle.  Twisting of a testicle.  Decreased blood flow to the scrotum.  What are the signs or symptoms? A hydrocele feels like a water-filled balloon. It may also feel heavy. A hydrocele can cause:  Swelling of the scrotum. The swelling may decrease when you lie down.  Swelling of the groin.  Mild discomfort in the scrotum.  Pain. This can develop if the hydrocele was caused by infection or twisting.  How is this diagnosed? This condition may be diagnosed with a medical history, physical exam, and imaging tests. You may also have blood and urine tests to check for infection. How is this treated? Treatment may include:  Watching and waiting, particularly if the hydrocele causes no symptoms.  Treatment of the underlying condition. This may include using antibiotic medicine.  Surgery to drain the fluid. Some surgical options include: ? Needle aspiration. For this procedure, a needle is used to drain fluid. ? Hydrocelectomy. For this procedure, an incision is made in the scrotum to remove the fluid sac.  Follow these instructions at home:  Keep all follow-up visits as told by your health care provider. This is important.  Watch the hydrocele for any changes.  Take over-the-counter and prescription medicines only as told by your health care provider.  If you were prescribed an antibiotic medicine, use it as told by your health care provider. Do not stop using the antibiotic even if your condition improves. Contact a health care provider if:  The swelling in your scrotum or groin gets worse.  The hydrocele becomes red, firm, tender to the touch, or painful.  You notice any changes in the  hydrocele.  You have a fever. This information is not intended to replace advice given to you by your health care provider. Make sure you discuss any questions you have with your health care provider. Document Released: 06/12/2009 Document Revised: 05/31/2015 Document Reviewed: 12/19/2013 Elsevier Interactive Patient Education  2018 [employer]  

## 2016-08-07 MED FILL — BYSTOLIC 10 MG TABLET: 10 | 30 days supply | Qty: 30 | Fill #1

## 2016-08-07 MED FILL — ALLOPURINOL 100 MG TABLET: 100 | 30 days supply | Qty: 30 | Fill #1

## 2016-08-07 MED FILL — LISINOPRIL 40 MG TAB: 40 | 30 days supply | Qty: 30 | Fill #1

## 2016-08-07 MED FILL — ATORVASTATIN 20 MG TABLET: 20 | 30 days supply | Qty: 30 | Fill #1
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
## 2016-09-09 MED FILL — LISINOPRIL 40 MG TAB: 40 | 30 days supply | Qty: 30 | Fill #2

## 2016-09-09 MED FILL — BYSTOLIC 10 MG TABLET: 10 | 30 days supply | Qty: 30 | Fill #2

## 2016-09-09 MED FILL — ATORVASTATIN 20 MG TABLET: 20 | 30 days supply | Qty: 30 | Fill #2

## 2016-09-09 MED FILL — ALLOPURINOL 100 MG TABLET: 100 | 30 days supply | Qty: 30 | Fill #2

## 2016-10-07 ENCOUNTER — Other Ambulatory Visit: Payer: Self-pay | Admitting: Family Medicine

## 2016-10-14 MED FILL — LISINOPRIL 40 MG TABLET: 40 | 30 days supply | Qty: 30 | Fill #3

## 2016-10-14 MED FILL — ATORVASTATIN 20 MG TABLET: 20 | 30 days supply | Qty: 30 | Fill #3

## 2016-10-14 MED FILL — BYSTOLIC 10 MG TABLET: 10 | 30 days supply | Qty: 30 | Fill #3

## 2016-10-14 MED FILL — ALLOPURINOL 100 MG TABS: 100 | 30 days supply | Qty: 30 | Fill #3

## 2016-11-13 MED FILL — LISINOPRIL 40 MG TABLET: 40 | 30 days supply | Qty: 30 | Fill #4

## 2016-11-13 MED FILL — BYSTOLIC 10 MG TABLET: 10 | 30 days supply | Qty: 30 | Fill #4

## 2016-11-13 MED FILL — ALLOPURINOL 100 MG TABS: 100 | 30 days supply | Qty: 30 | Fill #4

## 2016-11-13 MED FILL — ATORVASTATIN 20 MG TABLET: 20 | 30 days supply | Qty: 30 | Fill #4

## 2016-12-04 ENCOUNTER — Ambulatory Visit (INDEPENDENT_AMBULATORY_CARE_PROVIDER_SITE_OTHER): Payer: 59 | Admitting: Family Medicine

## 2016-12-04 ENCOUNTER — Encounter: Payer: Self-pay | Admitting: Family Medicine

## 2016-12-04 VITALS — BP 148/78 | HR 71 | Temp 98.6°F | Resp 16 | Ht 70.08 in | Wt 309.4 lb

## 2016-12-04 DIAGNOSIS — M1000 Idiopathic gout, unspecified site: Secondary | ICD-10-CM | POA: Diagnosis not present

## 2016-12-04 DIAGNOSIS — E669 Obesity, unspecified: Secondary | ICD-10-CM

## 2016-12-04 DIAGNOSIS — S82842A Displaced bimalleolar fracture of left lower leg, initial encounter for closed fracture: Secondary | ICD-10-CM | POA: Diagnosis not present

## 2016-12-04 DIAGNOSIS — I10 Essential (primary) hypertension: Secondary | ICD-10-CM

## 2016-12-04 DIAGNOSIS — R739 Hyperglycemia, unspecified: Secondary | ICD-10-CM

## 2016-12-04 DIAGNOSIS — Z23 Encounter for immunization: Secondary | ICD-10-CM

## 2016-12-04 DIAGNOSIS — E782 Mixed hyperlipidemia: Secondary | ICD-10-CM

## 2016-12-04 MED ORDER — LISINOPRIL 40 MG PO TABS: 40.0000 mg | ORAL_TABLET | Freq: Every day | ORAL | 3 refills | Status: DC

## 2016-12-04 MED ORDER — NEBIVOLOL HCL 10 MG PO TABS: 10.0000 mg | ORAL_TABLET | Freq: Every day | ORAL | 3 refills | Status: DC

## 2016-12-04 MED ORDER — ATORVASTATIN CALCIUM 20 MG PO TABS: 20.0000 mg | ORAL_TABLET | Freq: Every day | ORAL | 3 refills | Status: DC

## 2016-12-04 MED ORDER — ALLOPURINOL 100 MG PO TABS: ORAL_TABLET | ORAL | 3 refills | Status: DC

## 2016-12-04 MED ORDER — SILDENAFIL CITRATE 20 MG PO TABS: ORAL_TABLET | ORAL | 4 refills | Status: DC
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
## 2016-12-04 NOTE — Assessment & Plan Note (Addendum)
Check uric acid. 

## 2016-12-04 NOTE — Patient Instructions (Addendum)
Shingirx is the new shingles shot, 2 shots over 2-6 months.   Lidocaine gel, Salon Pas, Aspercreme, Icy Hot Carbohydrate Counting for Diabetes Mellitus, Adult Carbohydrate counting is a method for keeping track of how many carbohydrates you eat. Eating carbohydrates naturally increases the amount of sugar (glucose) in the blood. Counting how many carbohydrates you eat helps keep your blood glucose within normal limits, which helps you manage your diabetes (diabetes mellitus). It is important to know how many carbohydrates you can safely have in each meal. This is different for every person. A diet and nutrition specialist (registered dietitian) can help you make a meal plan and calculate how many carbohydrates you should have at each meal and snack. Carbohydrates are found in the following foods:  Grains, such as breads and cereals.  Dried beans and soy products.  Starchy vegetables, such as potatoes, peas, and corn.  Fruit and fruit juices.  Milk and yogurt.  Sweets and snack foods, such as cake, cookies, candy, chips, and soft drinks.  How do I count carbohydrates? There are two ways to count carbohydrates in food. You can use either of the methods or a combination of both. Reading "Nutrition Facts" on packaged food The "Nutrition Facts" list is included on the labels of almost all packaged foods and beverages in the [place] It includes:  The serving size.  Information about nutrients in each serving, including the grams (g) of carbohydrate per serving.  To use the "Nutrition Facts":  Decide how many servings you will have.  Multiply the number of servings by the number of carbohydrates per serving.  The resulting number is the total amount of carbohydrates that you will be having.  Learning standard serving sizes of other foods When you eat foods containing carbohydrates that are not packaged or do not include "Nutrition Facts" on the label, you need to measure the servings in  order to count the amount of carbohydrates:  Measure the foods that you will eat with a food scale or measuring cup, if needed.  Decide how many standard-size servings you will eat.  Multiply the number of servings by 15. Most carbohydrate-rich foods have about 15 g of carbohydrates per serving. ? For example, if you eat 8 oz (170 g) of strawberries, you will have eaten 2 servings and 30 g of carbohydrates (2 servings x 15 g = 30 g).  For foods that have more than one food mixed, such as soups and casseroles, you must count the carbohydrates in each food that is included.  The following list contains standard serving sizes of common carbohydrate-rich foods. Each of these servings has about 15 g of carbohydrates:   hamburger bun or  English muffin.   oz (15 mL) syrup.   oz (14 g) jelly.  1 slice of bread.  1 six-inch tortilla.  3 oz (85 g) cooked rice or pasta.  4 oz (113 g) cooked dried beans.  4 oz (113 g) starchy vegetable, such as peas, corn, or potatoes.  4 oz (113 g) hot cereal.  4 oz (113 g) mashed potatoes or  of a large baked potato.  4 oz (113 g) canned or frozen fruit.  4 oz (120 mL) fruit juice.  4-6 crackers.  6 chicken nuggets.  6 oz (170 g) unsweetened dry cereal.  6 oz (170 g) plain fat-free yogurt or yogurt sweetened with artificial sweeteners.  8 oz (240 mL) milk.  8 oz (170 g) fresh fruit or one small piece of fruit.  24 oz (680 g) popped popcorn.  Example of carbohydrate counting Sample meal  3 oz (85 g) chicken breast.  6 oz (170 g) brown rice.  4 oz (113 g) corn.  8 oz (240 mL) milk.  8 oz (170 g) strawberries with sugar-free whipped topping. Carbohydrate calculation 1. Identify the foods that contain carbohydrates: ? Rice. ? Corn. ? Milk. ? Strawberries. 2. Calculate how many servings you have of each food: ? 2 servings rice. ? 1 serving corn. ? 1 serving milk. ? 1 serving strawberries. 3. Multiply each number of  servings by 15 g: ? 2 servings rice x 15 g = 30 g. ? 1 serving corn x 15 g = 15 g. ? 1 serving milk x 15 g = 15 g. ? 1 serving strawberries x 15 g = 15 g. 4. Add together all of the amounts to find the total grams of carbohydrates eaten: ? 30 g + 15 g + 15 g + 15 g = 75 g of carbohydrates total. This information is not intended to replace advice given to you by your health care provider. Make sure you discuss any questions you have with your health care provider. Document Released: 12/23/2004 Document Revised: 07/13/2015 Document Reviewed: 06/06/2015 Elsevier Interactive Patient Education  [employer].

## 2016-12-04 NOTE — Assessment & Plan Note (Signed)
Well controlled, no changes to meds. Encouraged heart healthy diet such as the DASH diet and exercise as tolerated.  °

## 2016-12-04 NOTE — Assessment & Plan Note (Signed)
hgba1c acceptable, minimize simple carbs. Increase exercise as tolerated.  
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
## 2016-12-04 NOTE — Assessment & Plan Note (Signed)
Tolerating statin, encouraged heart healthy diet, avoid trans fats, minimize simple carbs and saturated fats. Increase exercise as tolerated 

## 2016-12-04 NOTE — Assessment & Plan Note (Signed)
Encouraged DASH diet, decrease po intake and increase exercise as tolerated. Needs 7-8 hours of sleep nightly. Avoid trans fats, eat small, frequent meals every 4-5 hours with lean proteins, complex carbs and healthy fats. Minimize simple carbs, GMO foods. 

## 2016-12-08 DIAGNOSIS — S82852D Displaced trimalleolar fracture of left lower leg, subsequent encounter for closed fracture with routine healing: Secondary | ICD-10-CM | POA: Diagnosis not present

## 2016-12-19 ENCOUNTER — Telehealth: Payer: Self-pay

## 2016-12-19 MED FILL — ATORVASTATIN 20 MG TABLET: 20 | 30 days supply | Qty: 30 | Fill #5

## 2016-12-19 MED FILL — ALLOPURINOL 100 MG TABS: 100 | 30 days supply | Qty: 30 | Fill #5

## 2016-12-19 MED FILL — BYSTOLIC 10 MG TABLET: 10 | 30 days supply | Qty: 30 | Fill #0

## 2016-12-19 MED FILL — LISINOPRIL 40 MG TABS: 40 | 30 days supply | Qty: 30 | Fill #5

## 2016-12-19 NOTE — Telephone Encounter (Signed)
PA initiated via Covermymeds; KEY: [id number]. Received PA approval effective from 12/19/2016 through 01/05/2038.

## 2017-01-22 MED FILL — ALLOPURINOL 100 MG TABS: 100 | 90 days supply | Qty: 90 | Fill #0

## 2017-01-22 MED FILL — BYSTOLIC 10 MG TABLET: 10 | 30 days supply | Qty: 30 | Fill #1

## 2017-01-22 MED FILL — LISINOPRIL 40 MG TABS: 40 | 90 days supply | Qty: 90 | Fill #0

## 2017-01-22 MED FILL — ATORVASTATIN 20 MG TABLET: 20 | 90 days supply | Qty: 90 | Fill #0

## 2017-02-24 MED FILL — BYSTOLIC 10 MG TABLET: 10 | 30 days supply | Qty: 30 | Fill #2

## 2017-03-26 MED FILL — BYSTOLIC 10 MG TABLET: 10 | 30 days supply | Qty: 30 | Fill #3

## 2017-04-29 MED FILL — ATORVASTATIN 20 MG TABLET: 20 | 90 days supply | Qty: 90 | Fill #1

## 2017-04-29 MED FILL — ALLOPURINOL 100 MG TABLET: 100 | 90 days supply | Qty: 90 | Fill #1

## 2017-04-29 MED FILL — BYSTOLIC 10 MG TABLET: 10 | 30 days supply | Qty: 30 | Fill #4

## 2017-04-29 MED FILL — LISINOPRIL 40 MG TABS: 40 | 90 days supply | Qty: 90 | Fill #1

## 2017-05-26 ENCOUNTER — Ambulatory Visit: Payer: BLUE CROSS/BLUE SHIELD | Admitting: Family Medicine

## 2017-05-26 ENCOUNTER — Encounter: Payer: Self-pay | Admitting: Family Medicine

## 2017-05-26 VITALS — BP 130/72 | HR 61 | Temp 98.3°F | Resp 18 | Wt 306.6 lb

## 2017-05-26 DIAGNOSIS — N433 Hydrocele, unspecified: Secondary | ICD-10-CM

## 2017-05-26 DIAGNOSIS — R1013 Epigastric pain: Secondary | ICD-10-CM | POA: Diagnosis not present

## 2017-05-26 DIAGNOSIS — R739 Hyperglycemia, unspecified: Secondary | ICD-10-CM

## 2017-05-26 DIAGNOSIS — Z23 Encounter for immunization: Secondary | ICD-10-CM

## 2017-05-26 DIAGNOSIS — I10 Essential (primary) hypertension: Secondary | ICD-10-CM

## 2017-05-26 DIAGNOSIS — M1000 Idiopathic gout, unspecified site: Secondary | ICD-10-CM

## 2017-05-26 DIAGNOSIS — E782 Mixed hyperlipidemia: Secondary | ICD-10-CM

## 2017-05-26 MED ORDER — NEBIVOLOL HCL 10 MG PO TABS
10.0000 mg | ORAL_TABLET | Freq: Every day | ORAL | 1 refills | Status: DC
Start: 2017-05-26 — End: 2017-12-18

## 2017-05-26 MED ORDER — RANITIDINE HCL 300 MG PO CAPS: 300.0000 mg | ORAL_CAPSULE | Freq: Every evening | ORAL | 1 refills | Status: DC

## 2017-05-26 MED ORDER — ALBUTEROL SULFATE HFA 108 (90 BASE) MCG/ACT IN AERS: INHALATION_SPRAY | RESPIRATORY_TRACT | 3 refills | Status: DC

## 2017-05-26 MED ORDER — ATORVASTATIN CALCIUM 20 MG PO TABS: 20.0000 mg | ORAL_TABLET | Freq: Every day | ORAL | 1 refills | Status: DC

## 2017-05-26 MED ORDER — SILDENAFIL CITRATE 20 MG PO TABS: ORAL_TABLET | ORAL | 1 refills | Status: DC

## 2017-05-26 MED ORDER — ALLOPURINOL 100 MG PO TABS: ORAL_TABLET | ORAL | 1 refills | Status: DC

## 2017-05-26 MED ORDER — LISINOPRIL 40 MG PO TABS: 40.0000 mg | ORAL_TABLET | Freq: Every day | ORAL | 1 refills | Status: DC

## 2017-05-26 MED FILL — raNITIdine HCL 300 MG TABS: 300 | 90 days supply | Qty: 90 | Fill #0

## 2017-05-26 MED FILL — BYSTOLIC 10 MG TABLET: 10 | 90 days supply | Qty: 90 | Fill #0

## 2017-05-26 NOTE — Assessment & Plan Note (Signed)
L>R and increased and more red and warm. Consider referral to urology for further consideration

## 2017-05-26 NOTE — Assessment & Plan Note (Signed)
hgba1c acceptable, minimize simple carbs. Increase exercise as tolerated.  

## 2017-05-26 NOTE — Patient Instructions (Signed)
Food Choices for Gastroesophageal Reflux Disease, Adult When you have gastroesophageal reflux disease (GERD), the foods you eat and your eating habits are very important. Choosing the right foods can help ease your discomfort. What guidelines do I need to follow?  Choose fruits, vegetables, whole grains, and low-fat dairy products.  Choose low-fat meat, fish, and poultry.  Limit fats such as oils, salad dressings, butter, nuts, and avocado.  Keep a food diary. This helps you identify foods that cause symptoms.  Avoid foods that cause symptoms. These may be different for everyone.  Eat small meals often instead of 3 large meals a day.  Eat your meals slowly, in a place where you are relaxed.  Limit fried foods.  Cook foods using methods other than frying.  Avoid drinking alcohol.  Avoid drinking large amounts of liquids with your meals.  Avoid bending over or lying down until 2-3 hours after eating. What foods are not recommended? These are some foods and drinks that may make your symptoms worse: Vegetables  Tomatoes. Tomato juice. Tomato and spaghetti sauce. Chili peppers. Onion and garlic. Horseradish. Fruits  Oranges, grapefruit, and lemon (fruit and juice). Meats  High-fat meats, fish, and poultry. This includes hot dogs, ribs, ham, sausage, salami, and bacon. Dairy  Whole milk and chocolate milk. Sour cream. Cream. Butter. Ice cream. Cream cheese. Drinks  Coffee and tea. Bubbly (carbonated) drinks or energy drinks. Condiments  Hot sauce. Barbecue sauce. Sweets/Desserts  Chocolate and cocoa. Donuts. Peppermint and spearmint. Fats and Oils  High-fat foods. This includes French fries and potato chips. Other  Vinegar. Strong spices. This includes black pepper, white pepper, red pepper, cayenne, curry powder, cloves, ginger, and chili powder. The items listed above may not be a complete list of foods and drinks to avoid. Contact your dietitian for more information.    This information is not intended to replace advice given to you by your health care provider. Make sure you discuss any questions you have with your health care provider. Document Released: 06/24/2011 Document Revised: 05/31/2015 Document Reviewed: 10/27/2012 Elsevier Interactive Patient Education  2017 [employer]  

## 2017-05-26 NOTE — Assessment & Plan Note (Signed)
Well controlled, no changes to meds. Encouraged heart healthy diet such as the DASH diet and exercise as tolerated.  °

## 2017-05-26 NOTE — Assessment & Plan Note (Signed)
Encouraged heart healthy diet, increase exercise, avoid trans fats, consider a krill oil cap daily 

## 2017-05-26 NOTE — Assessment & Plan Note (Signed)
Tolerating allopurinol

## 2017-05-26 NOTE — Progress Notes (Signed)
Subjective:  I acted as a Education administrator for Dr. Charlett Blake. [place], [place]  Patient ID: Barry Anthony, male    DOB: Sep 15, 1960, 57 y.o.   MRN: [id number]  No chief complaint on file.   HPI  Patient is in today for medication refills and follow up on chronic medical concerns including hyperlipidemia, hyperglycemia, reflux and more. No recent febrile illness or hospitalizations. Tolerated his first Shingrix shot. He has noted a flare in his reflux over the past few months. He cut down on alcohol and coffee intake and that has helped some. Denies CP/palp/SOB/HA/congestion/fevers or GU c/o. Taking meds as prescribed  Patient Care Team: Mosie Lukes, MD as PCP - General (Family Medicine)   Past Medical History:  Diagnosis Date  . Allergic rhinitis   . Allergy   . Bilateral hydrocele 12/08/2012  . GERD (gastroesophageal reflux disease)   . History of chicken pox   . Hyperglycemia 08/15/2008   Qualifier: Diagnosis of  By: Wynona Luna    . Hyperlipidemia   . Hypertension   . Lipoma of shoulder   . Preventative health care 02/12/2014  . Testicular pain, left 08/04/2016    Past Surgical History:  Procedure Laterality Date  . EYE SURGERY    . HERNIA REPAIR     groin  . KNEE SURGERY  1981   right knee  . TONSILLECTOMY  1969  . WISDOM TOOTH EXTRACTION      Family History  Problem Relation Age of Onset  . Thyroid cancer Father   . Hypertension Father   . Coronary artery disease Father   . Hyperlipidemia Father   . Alcohol abuse Father        smoker  . Heart disease Father   . Hypothyroidism Mother   . Dementia Maternal Aunt   . Dementia Maternal Uncle   . Cancer Maternal Grandmother        breast  . Heart disease Paternal Grandmother        heart disease  . Other Unknown        no colon cancer, no prostate canceer  . Colon cancer Neg Hx     Social History   Socioeconomic History  . Marital status: Divorced    Spouse name: Not on file  . Number of children: Not on file    . Years of education: Not on file  . Highest education level: Not on file  Occupational History  . Not on file  Social Needs  . Financial resource strain: Not on file  . Food insecurity:    Worry: Not on file    Inability: Not on file  . Transportation needs:    Medical: Not on file    Non-medical: Not on file  Tobacco Use  . Smoking status: Never Smoker  . Smokeless tobacco: Never Used  Substance and Sexual Activity  . Alcohol use: Yes    Comment: 2 drinks / week  . Drug use: No  . Sexual activity: Yes    Comment: lives with wife, works as a Public house manager at [employer]. no major dietary restrictions  Lifestyle  . Physical activity:    Days per week: Not on file    Minutes per session: Not on file  . Stress: Not on file  Relationships  . Social connections:    Talks on phone: Not on file    Gets together: Not on file    Attends religious service: Not on file    Active  member of club or organization: Not on file    Attends meetings of clubs or organizations: Not on file    Relationship status: Not on file  . Intimate partner violence:    Fear of current or ex partner: Not on file    Emotionally abused: Not on file    Physically abused: Not on file    Forced sexual activity: Not on file  Other Topics Concern  . Not on file  Social History Narrative   Occupation:  Parts mgr   Married 16 yrs    2 children  10, 12    Never Smoked    Alcohol use-yes (social)     Outpatient Medications Prior to Visit  Medication Sig Dispense Refill  . fluticasone (FLONASE) 50 MCG/ACT nasal spray Place 2 sprays into both nostrils daily as needed. 48 g 3  . albuterol (PROAIR HFA) 108 (90 BASE) MCG/ACT inhaler INHALE 2 PUFFS INTO THE LUNGS DAILY AS NEEDED 8.5 g 3  . allopurinol (ZYLOPRIM) 100 MG tablet TAKE 1 TABLET (100 MG TOTAL) BY MOUTH DAILY. 90 tablet 3  . atorvastatin (LIPITOR) 20 MG tablet Take 1 tablet (20 mg total) by mouth daily. 90 tablet 3  . ibuprofen  (ADVIL,MOTRIN) 600 MG tablet Take 1 tablet (600 mg total) by mouth every 8 (eight) hours as needed. 30 tablet 0  . lisinopril (PRINIVIL,ZESTRIL) 40 MG tablet Take 1 tablet (40 mg total) by mouth daily. 90 tablet 3  . nebivolol (BYSTOLIC) 10 MG tablet Take 1 tablet (10 mg total) by mouth daily. 90 tablet 3  . oxyCODONE (OXY IR/ROXICODONE) 5 MG immediate release tablet Take 1-2 tablets (5-10 mg total) by mouth every 6 (six) hours as needed for moderate pain or severe pain. 30 tablet 0  . oxyCODONE-acetaminophen (PERCOCET/ROXICET) 5-325 MG tablet Take 1 tablet by mouth every 4 (four) hours as needed for severe pain. 15 tablet 0  . sildenafil (REVATIO) 20 MG tablet TAKE 2 TO 3 TABLETS BY MOUTH DAILY AS NEEDED FOR ED 40 tablet 4   No facility-administered medications prior to visit.     No Known Allergies  Review of Systems  Constitutional: Negative for fever and malaise/fatigue.  HENT: Negative for congestion.   Eyes: Negative for blurred vision.  Respiratory: Negative for shortness of breath.   Cardiovascular: Negative for chest pain, palpitations and leg swelling.  Gastrointestinal: Negative for abdominal pain, blood in stool and nausea.  Genitourinary: Negative for dysuria and frequency.  Musculoskeletal: Negative for falls.  Skin: Negative for rash.  Neurological: Negative for dizziness, loss of consciousness and headaches.  Endo/Heme/Allergies: Negative for environmental allergies.  Psychiatric/Behavioral: Negative for depression. The patient is not nervous/anxious.        Objective:    Physical Exam  Constitutional: He is oriented to person, place, and time. He appears well-developed and well-nourished. No distress.  HENT:  Head: Normocephalic and atraumatic.  Nose: Nose normal.  Eyes: Right eye exhibits no discharge. Left eye exhibits no discharge.  Neck: Normal range of motion. Neck supple.  Cardiovascular: Normal rate and regular rhythm.  No murmur heard. Pulmonary/Chest:  Effort normal and breath sounds normal.  Abdominal: Soft. Bowel sounds are normal. There is no tenderness.  Musculoskeletal: He exhibits no edema.  Neurological: He is alert and oriented to person, place, and time.  Skin: Skin is warm and dry.  Psychiatric: He has a normal mood and affect.  Nursing note and vitals reviewed.   BP 130/72 (BP Location: Left Arm, Patient Position:  Sitting, Cuff Size: Large)   Pulse 61   Temp 98.3 F (36.8 C) (Oral)   Resp 18   Wt (!) 306 lb 9.6 oz (139.1 kg)   SpO2 91%   BMI 43.89 kg/m  Wt Readings from Last 3 Encounters:  05/26/17 (!) 306 lb 9.6 oz (139.1 kg)  12/04/16 (!) 309 lb 6.4 oz (140.3 kg)  08/04/16 287 lb (130.2 kg)   BP Readings from Last 3 Encounters:  05/26/17 130/72  12/04/16 (!) 148/78  08/04/16 128/68     Immunization History  Administered Date(s) Administered  . H1N1 02/03/2008  . Influenza Split 09/16/2011  . Influenza Whole 10/06/2008, 11/07/2009  . Influenza,inj,Quad PF,6+ Mos 02/07/2014, 12/06/2014  . Pneumococcal Conjugate-13 12/07/2012  . Td 02/03/2008  . Zoster Recombinat (Shingrix) 12/04/2016, 05/26/2017    Health Maintenance  Topic Date Due  . Hepatitis C Screening  05/25/1960  . PNEUMOCOCCAL POLYSACCHARIDE VACCINE (1) 09/11/1964  . FOOT EXAM  09/11/1972  . OPHTHALMOLOGY EXAM  09/11/1972  . INFLUENZA VACCINE  08/06/2017  . HEMOGLOBIN A1C  11/26/2017  . TETANUS/TDAP  02/02/2018  . COLONOSCOPY  04/20/2024  . HIV Screening  Completed    Lab Results  Component Value Date   WBC 7.5 05/26/2017   HGB 16.2 05/26/2017   HCT 47.3 05/26/2017   PLT 244.0 05/26/2017   GLUCOSE 90 05/26/2017   CHOL 149 05/26/2017   TRIG (H) 05/26/2017    491.0 Triglyceride is over 400; calculations on Lipids are invalid.   HDL 32.60 (L) 05/26/2017   LDLDIRECT 68.0 05/26/2017   LDLCALC 58 12/27/2015   ALT 23 05/26/2017   AST 21 05/26/2017   NA 139 05/26/2017   K 4.9 05/26/2017   CL 99 05/26/2017   CREATININE 1.17 05/26/2017     BUN 20 05/26/2017   CO2 31 05/26/2017   TSH 1.76 05/26/2017   PSA 1.34 12/07/2012   HGBA1C 6.0 05/26/2017    Lab Results  Component Value Date   TSH 1.76 05/26/2017   Lab Results  Component Value Date   WBC 7.5 05/26/2017   HGB 16.2 05/26/2017   HCT 47.3 05/26/2017   MCV 86.9 05/26/2017   PLT 244.0 05/26/2017   Lab Results  Component Value Date   NA 139 05/26/2017   K 4.9 05/26/2017   CO2 31 05/26/2017   GLUCOSE 90 05/26/2017   BUN 20 05/26/2017   CREATININE 1.17 05/26/2017   BILITOT 0.5 05/26/2017   ALKPHOS 60 05/26/2017   AST 21 05/26/2017   ALT 23 05/26/2017   PROT 7.1 05/26/2017   ALBUMIN 4.4 05/26/2017   CALCIUM 10.0 05/26/2017   GFR 68.87 05/26/2017   Lab Results  Component Value Date   CHOL 149 05/26/2017   Lab Results  Component Value Date   HDL 32.60 (L) 05/26/2017   Lab Results  Component Value Date   LDLCALC 58 12/27/2015   Lab Results  Component Value Date   TRIG (H) 05/26/2017    491.0 Triglyceride is over 400; calculations on Lipids are invalid.   Lab Results  Component Value Date   CHOLHDL 5 05/26/2017   Lab Results  Component Value Date   HGBA1C 6.0 05/26/2017         Assessment & Plan:   Problem List Items Addressed This Visit    Hyperlipidemia, mixed    Encouraged heart healthy diet, increase exercise, avoid trans fats, consider a krill oil cap daily      Relevant Medications   sildenafil (REVATIO) 20  MG tablet   atorvastatin (LIPITOR) 20 MG tablet   lisinopril (PRINIVIL,ZESTRIL) 40 MG tablet   nebivolol (BYSTOLIC) 10 MG tablet   Other Relevant Orders   Lipid panel (Completed)   Essential hypertension    Well controlled, no changes to meds. Encouraged heart healthy diet such as the DASH diet and exercise as tolerated.       Relevant Medications   sildenafil (REVATIO) 20 MG tablet   atorvastatin (LIPITOR) 20 MG tablet   lisinopril (PRINIVIL,ZESTRIL) 40 MG tablet   nebivolol (BYSTOLIC) 10 MG tablet   Other  Relevant Orders   CBC (Completed)   Comprehensive metabolic panel (Completed)   TSH (Completed)   Hyperglycemia    hgba1c acceptable, minimize simple carbs. Increase exercise as tolerated.       Relevant Orders   Hemoglobin A1c (Completed)   Gout    Tolerating allopurinol      Relevant Orders   Uric acid (Completed)   Hydrocele, bilateral    L>R and increased and more red and warm. Consider referral to urology for further consideration      Relevant Orders   Ambulatory referral to Urology   Epigastric pain    H Pylori is negative. Avoid offending foods, start probiotics. Do not eat large meals in late evening and consider raising head of bed. May use ranitidine prn      Relevant Orders   H. pylori antibody, IgG (Completed)    Other Visit Diagnoses    Need for shingles vaccine    -  Primary   Relevant Orders   Varicella-zoster vaccine IM (Shingrix) (Completed)      I have discontinued Mcdaniel E. Raschke's oxyCODONE-acetaminophen, ibuprofen, and oxyCODONE. I am also having him start on ranitidine. Additionally, I am having him maintain his fluticasone, sildenafil, albuterol, allopurinol, atorvastatin, lisinopril, and nebivolol.  Meds ordered this encounter  Medications  . sildenafil (REVATIO) 20 MG tablet    Sig: TAKE 2 TO 3 TABLETS BY MOUTH DAILY AS NEEDED FOR ED    Dispense:  120 tablet    Refill:  1  . albuterol (PROAIR HFA) 108 (90 Base) MCG/ACT inhaler    Sig: INHALE 2 PUFFS INTO THE LUNGS DAILY AS NEEDED    Dispense:  8.5 g    Refill:  3  . allopurinol (ZYLOPRIM) 100 MG tablet    Sig: TAKE 1 TABLET (100 MG TOTAL) BY MOUTH DAILY.    Dispense:  90 tablet    Refill:  1  . atorvastatin (LIPITOR) 20 MG tablet    Sig: Take 1 tablet (20 mg total) by mouth daily.    Dispense:  90 tablet    Refill:  1  . lisinopril (PRINIVIL,ZESTRIL) 40 MG tablet    Sig: Take 1 tablet (40 mg total) by mouth daily.    Dispense:  90 tablet    Refill:  1  . nebivolol (BYSTOLIC) 10 MG  tablet    Sig: Take 1 tablet (10 mg total) by mouth daily.    Dispense:  90 tablet    Refill:  1  . ranitidine (ZANTAC) 300 MG capsule    Sig: Take 1 capsule (300 mg total) by mouth every evening.    Dispense:  90 capsule    Refill:  1    CMA served as scribe during this visit. History, Physical and Plan performed by medical provider. Documentation and orders reviewed and attested to.  Penni Homans, MD

## 2017-05-27 LAB — CBC
HCT: 47.3 % (ref 39.0–52.0)
HEMOGLOBIN: 16.2 g/dL (ref 13.0–17.0)
MCHC: 34.2 g/dL (ref 30.0–36.0)
MCV: 86.9 fl (ref 78.0–100.0)
PLATELETS: 244 10*3/uL (ref 150.0–400.0)
RBC: 5.44 Mil/uL (ref 4.22–5.81)
RDW: 13.5 % (ref 11.5–15.5)
WBC: 7.5 10*3/uL (ref 4.0–10.5)

## 2017-05-27 LAB — LIPID PANEL
CHOLESTEROL: 149 mg/dL (ref 0–200)
HDL: 32.6 mg/dL — ABNORMAL LOW (ref >39.00)
Total CHOL/HDL Ratio: 5

## 2017-05-27 LAB — COMPREHENSIVE METABOLIC PANEL
ALBUMIN: 4.4 g/dL (ref 3.5–5.2)
ALK PHOS: 60 U/L (ref 39–117)
ALT: 23 U/L (ref 0–53)
AST: 21 U/L (ref 0–37)
BILIRUBIN TOTAL: 0.5 mg/dL (ref 0.2–1.2)
BUN: 20 mg/dL (ref 6–23)
CO2: 31 meq/L (ref 19–32)
CREATININE: 1.17 mg/dL (ref 0.40–1.50)
Calcium: 10 mg/dL (ref 8.4–10.5)
Chloride: 99 mEq/L (ref 96–112)
GFR: 68.87 mL/min (ref >60.00)
Glucose, Bld: 90 mg/dL (ref 70–99)
Potassium: 4.9 mEq/L (ref 3.5–5.1)
Sodium: 139 mEq/L (ref 135–145)
TOTAL PROTEIN: 7.1 g/dL (ref 6.0–8.3)

## 2017-05-27 LAB — URIC ACID: Uric Acid, Serum: 7.4 mg/dL (ref 4.0–7.8)

## 2017-05-27 LAB — H. PYLORI ANTIBODY, IGG: H Pylori IgG: NEGATIVE

## 2017-05-27 LAB — TSH: TSH: 1.76 u[IU]/mL (ref 0.35–4.50)

## 2017-05-27 LAB — HEMOGLOBIN A1C: Hgb A1c MFr Bld: 6 % (ref 4.6–6.5)

## 2017-05-27 LAB — LDL CHOLESTEROL, DIRECT: LDL DIRECT: 68 mg/dL

## 2017-06-01 DIAGNOSIS — R1013 Epigastric pain: Secondary | ICD-10-CM | POA: Insufficient documentation

## 2017-06-01 NOTE — Assessment & Plan Note (Signed)
H Pylori is negative. Avoid offending foods, start probiotics. Do not eat large meals in late evening and consider raising head of bed. May use ranitidine prn

## 2017-06-30 ENCOUNTER — Encounter: Payer: Self-pay | Admitting: Family Medicine

## 2017-08-04 DIAGNOSIS — N430 Encysted hydrocele: Secondary | ICD-10-CM | POA: Diagnosis not present

## 2017-08-10 MED FILL — ATORVASTATIN CALCIUM 20 MG: 20 | 90 days supply | Qty: 90 | Fill #2

## 2017-08-10 MED FILL — ALLOPURINOL 100 MG TABLET: 100 | 90 days supply | Qty: 90 | Fill #2

## 2017-08-10 MED FILL — LISINOPRIL 40 MG TABS: 40 | 90 days supply | Qty: 90 | Fill #2

## 2017-09-08 MED FILL — BYSTOLIC 10 MG TABLET: 10 | 90 days supply | Qty: 90 | Fill #1

## 2017-09-25 DIAGNOSIS — N430 Encysted hydrocele: Secondary | ICD-10-CM | POA: Diagnosis not present

## 2017-11-19 MED FILL — ATORVASTATIN CALCIUM 20 MG: 20 | 90 days supply | Qty: 90 | Fill #3

## 2017-11-19 MED FILL — LISINOPRIL 40 MG TABLET: 40 | 90 days supply | Qty: 90 | Fill #3

## 2017-11-19 MED FILL — ALLOPURINOL 100 MG TABLET: 100 | 90 days supply | Qty: 90 | Fill #3

## 2017-11-24 ENCOUNTER — Ambulatory Visit (INDEPENDENT_AMBULATORY_CARE_PROVIDER_SITE_OTHER): Payer: BLUE CROSS/BLUE SHIELD | Admitting: Family Medicine

## 2017-11-24 VITALS — BP 130/64 | HR 76 | Temp 98.4°F | Ht 70.0 in | Wt 300.0 lb

## 2017-11-24 DIAGNOSIS — M1000 Idiopathic gout, unspecified site: Secondary | ICD-10-CM

## 2017-11-24 DIAGNOSIS — E782 Mixed hyperlipidemia: Secondary | ICD-10-CM

## 2017-11-24 DIAGNOSIS — Z23 Encounter for immunization: Secondary | ICD-10-CM | POA: Diagnosis not present

## 2017-11-24 DIAGNOSIS — I10 Essential (primary) hypertension: Secondary | ICD-10-CM

## 2017-11-24 DIAGNOSIS — R739 Hyperglycemia, unspecified: Secondary | ICD-10-CM | POA: Diagnosis not present

## 2017-11-24 DIAGNOSIS — Z7289 Other problems related to lifestyle: Secondary | ICD-10-CM | POA: Diagnosis not present

## 2017-11-24 DIAGNOSIS — N529 Male erectile dysfunction, unspecified: Secondary | ICD-10-CM

## 2017-11-24 DIAGNOSIS — E669 Obesity, unspecified: Secondary | ICD-10-CM

## 2017-11-24 DIAGNOSIS — Z0000 Encounter for general adult medical examination without abnormal findings: Secondary | ICD-10-CM | POA: Diagnosis not present

## 2017-11-24 MED ORDER — SILDENAFIL CITRATE 20 MG PO TABS: 20.0000 mg | ORAL_TABLET | Freq: Every day | ORAL | 2 refills | Status: DC | PRN

## 2017-11-24 MED ORDER — FAMOTIDINE 40 MG PO TABS: 40.0000 mg | ORAL_TABLET | Freq: Every evening | ORAL | 3 refills | Status: DC | PRN

## 2017-11-24 MED ORDER — ACETAMINOPHEN 500 MG PO TABS: 500.0000 mg | ORAL_TABLET | Freq: Three times a day (TID) | ORAL | 2 refills | Status: DC | PRN

## 2017-11-24 MED ORDER — IBUPROFEN 200 MG PO TABS: 200.0000 mg | ORAL_TABLET | Freq: Three times a day (TID) | ORAL | 2 refills | Status: DC | PRN

## 2017-11-24 MED ORDER — ALBUTEROL SULFATE HFA 108 (90 BASE) MCG/ACT IN AERS: INHALATION_SPRAY | RESPIRATORY_TRACT | 3 refills | Status: DC

## 2017-11-24 MED FILL — IBUPROFEN 200 MG TABLET: 200 | 17 days supply | Qty: 100 | Fill #0

## 2017-11-24 MED FILL — ACETAMINOPHEN 500 MG TABS: 500 | 17 days supply | Qty: 100 | Fill #0

## 2017-11-24 MED FILL — VENTOLIN HFA 90 MCG INHALER: 108 (90 BAS | 90 days supply | Qty: 18 | Fill #0

## 2017-11-24 MED FILL — FAMOTIDINE 40 MG TABS: 40 | 90 days supply | Qty: 90 | Fill #0

## 2017-11-24 NOTE — Assessment & Plan Note (Signed)
Encouraged heart healthy diet, increase exercise, avoid trans fats, consider a krill oil cap daily. Tolerating Atorvastatin 

## 2017-11-24 NOTE — Patient Instructions (Addendum)
Advil/Ibuprofen 200 mg tab 1-2 tabs once to twice daily with food as needed. Tylenol/Acetaminophen ES 500 mg twice daily as needed Preventive Care 40-64 Years, Male Preventive care refers to lifestyle choices and visits with your health care provider that can promote health and wellness. What does preventive care include?  A yearly physical exam. This is also called an annual well check.  Dental exams once or twice a year.  Routine eye exams. Ask your health care provider how often you should have your eyes checked.  Personal lifestyle choices, including: ? Daily care of your teeth and gums. ? Regular physical activity. ? Eating a healthy diet. ? Avoiding tobacco and drug use. ? Limiting alcohol use. ? Practicing safe sex. ? Taking low-dose aspirin every day starting at age 80. What happens during an annual well check? The services and screenings done by your health care provider during your annual well check will depend on your age, overall health, lifestyle risk factors, and family history of disease. Counseling Your health care provider may ask you questions about your:  Alcohol use.  Tobacco use.  Drug use.  Emotional well-being.  Home and relationship well-being.  Sexual activity.  Eating habits.  Work and work Statistician.  Screening You may have the following tests or measurements:  Height, weight, and BMI.  Blood pressure.  Lipid and cholesterol levels. These may be checked every 5 years, or more frequently if you are over 40 years old.  Skin check.  Lung cancer screening. You may have this screening every year starting at age 44 if you have a 30-pack-year history of smoking and currently smoke or have quit within the past 15 years.  Fecal occult blood test (FOBT) of the stool. You may have this test every year starting at age 3.  Flexible sigmoidoscopy or colonoscopy. You may have a sigmoidoscopy every 5 years or a colonoscopy every 10 years starting  at age 60.  Prostate cancer screening. Recommendations will vary depending on your family history and other risks.  Hepatitis C blood test.  Hepatitis B blood test.  Sexually transmitted disease (STD) testing.  Diabetes screening. This is done by checking your blood sugar (glucose) after you have not eaten for a while (fasting). You may have this done every 1-3 years.  Discuss your test results, treatment options, and if necessary, the need for more tests with your health care provider. Vaccines Your health care provider may recommend certain vaccines, such as:  Influenza vaccine. This is recommended every year.  Tetanus, diphtheria, and acellular pertussis (Tdap, Td) vaccine. You may need a Td booster every 10 years.  Varicella vaccine. You may need this if you have not been vaccinated.  Zoster vaccine. You may need this after age 66.  Measles, mumps, and rubella (MMR) vaccine. You may need at least one dose of MMR if you were born in 1957 or later. You may also need a second dose.  Pneumococcal 13-valent conjugate (PCV13) vaccine. You may need this if you have certain conditions and have not been vaccinated.  Pneumococcal polysaccharide (PPSV23) vaccine. You may need one or two doses if you smoke cigarettes or if you have certain conditions.  Meningococcal vaccine. You may need this if you have certain conditions.  Hepatitis A vaccine. You may need this if you have certain conditions or if you travel or work in places where you may be exposed to hepatitis A.  Hepatitis B vaccine. You may need this if you have certain conditions or  if you travel or work in places where you may be exposed to hepatitis B.  Haemophilus influenzae type b (Hib) vaccine. You may need this if you have certain risk factors.  Talk to your health care provider about which screenings and vaccines you need and how often you need them. This information is not intended to replace advice given to you by your  health care provider. Make sure you discuss any questions you have with your health care provider. Document Released: 01/19/2015 Document Revised: 09/12/2015 Document Reviewed: 10/24/2014 Elsevier Interactive Patient Education  [employer].

## 2017-11-24 NOTE — Assessment & Plan Note (Signed)
hgba1c acceptable, minimize simple carbs. Increase exercise as tolerated.  

## 2017-11-24 NOTE — Assessment & Plan Note (Signed)
Patient encouraged to maintain heart healthy diet, regular exercise, adequate sleep. Consider daily probiotics. Take medications as prescribed 

## 2017-11-24 NOTE — Assessment & Plan Note (Signed)
No recent flare. Check uric acid

## 2017-11-24 NOTE — Assessment & Plan Note (Signed)
Well controlled, no changes to meds. Encouraged heart healthy diet such as the DASH diet and exercise as tolerated.  °

## 2017-11-24 NOTE — Assessment & Plan Note (Signed)
Encouraged DASH diet, decrease po intake and increase exercise as tolerated. Needs 7-8 hours of sleep nightly. Avoid trans fats, eat small, frequent meals every 4-5 hours with lean proteins, complex carbs and healthy fats. Minimize simple carbs, Healthy Weight [facility] Wellness is offered.

## 2017-11-25 LAB — HEMOGLOBIN A1C: HEMOGLOBIN A1C: 6 % (ref 4.6–6.5)

## 2017-11-25 LAB — LDL CHOLESTEROL, DIRECT: Direct LDL: 77 mg/dL

## 2017-11-25 LAB — COMPREHENSIVE METABOLIC PANEL
ALT: 29 U/L (ref 0–53)
AST: 22 U/L (ref 0–37)
Albumin: 4.5 g/dL (ref 3.5–5.2)
Alkaline Phosphatase: 68 U/L (ref 39–117)
BUN: 15 mg/dL (ref 6–23)
CHLORIDE: 101 meq/L (ref 96–112)
CO2: 29 meq/L (ref 19–32)
Calcium: 9.4 mg/dL (ref 8.4–10.5)
Creatinine, Ser: 1.15 mg/dL (ref 0.40–1.50)
GFR: 70.12 mL/min (ref >60.00)
GLUCOSE: 117 mg/dL — AB (ref 70–99)
Potassium: 4.7 mEq/L (ref 3.5–5.1)
SODIUM: 139 meq/L (ref 135–145)
Total Bilirubin: 0.4 mg/dL (ref 0.2–1.2)
Total Protein: 7 g/dL (ref 6.0–8.3)

## 2017-11-25 LAB — HEPATITIS C ANTIBODY
HEP C AB: NONREACTIVE
SIGNAL TO CUT-OFF: 0.02 (ref <1.00)

## 2017-11-25 LAB — LIPID PANEL
CHOL/HDL RATIO: 4
CHOLESTEROL: 129 mg/dL (ref 0–200)
HDL: 35.3 mg/dL — ABNORMAL LOW (ref >39.00)
NonHDL: 93.51
TRIGLYCERIDES: 230 mg/dL — AB (ref 0.0–149.0)
VLDL: 46 mg/dL — AB (ref 0.0–40.0)

## 2017-11-25 LAB — CBC
HCT: 49.5 % (ref 39.0–52.0)
Hemoglobin: 16.6 g/dL (ref 13.0–17.0)
MCHC: 33.6 g/dL (ref 30.0–36.0)
MCV: 86.2 fl (ref 78.0–100.0)
Platelets: 286 10*3/uL (ref 150.0–400.0)
RBC: 5.75 Mil/uL (ref 4.22–5.81)
RDW: 13.3 % (ref 11.5–15.5)
WBC: 8.3 10*3/uL (ref 4.0–10.5)

## 2017-11-25 LAB — URIC ACID: URIC ACID, SERUM: 6.7 mg/dL (ref 4.0–7.8)

## 2017-11-25 LAB — TSH: TSH: 2.04 u[IU]/mL (ref 0.35–4.50)

## 2017-11-25 LAB — PSA: PSA: 1.46 ng/mL (ref 0.10–4.00)

## 2017-11-26 ENCOUNTER — Ambulatory Visit: Payer: Self-pay | Admitting: *Deleted

## 2017-11-26 ENCOUNTER — Other Ambulatory Visit: Payer: Self-pay

## 2017-11-26 DIAGNOSIS — E782 Mixed hyperlipidemia: Secondary | ICD-10-CM

## 2017-11-26 MED ORDER — ATORVASTATIN CALCIUM 40 MG PO TABS: 40.0000 mg | ORAL_TABLET | Freq: Every day | ORAL | 3 refills | Status: DC

## 2017-11-26 NOTE — Telephone Encounter (Signed)
See previous note

## 2017-11-26 NOTE — Telephone Encounter (Signed)
His sig should read 1-5 tabs daily as needed please co

## 2017-11-26 NOTE — Telephone Encounter (Signed)
Contacted Museum/gallery curator, Pharmacist at [facility]; she requests clarificction of order for sildenafil; she states that there are 2 sets of directions:  sildenafil (REVATIO) 20 MG tablet 20-100 mg, Daily PRN 2 ordered        Summary: Take 1-5 tablets (20-100 mg total) by mouth daily as needed. TAKE 2 TO 3 TABLETS BY MOUTH DAILY AS NEEDED FOR ED, Starting Tue 11/24/2017, Normal  Dose, Route, Frequency: 20-100 mg, Oral, Daily PRN Start: 11/24/2017 Ord/Sold: 11/24/2017 (O) Report Taking:  Long-term:  Pharmacy: [facility] # [place], [place] Dose History Change     Patient Sig: Take 1-5 tablets (20-100 mg total) by mouth daily as needed. TAKE 2 TO 3 TABLETS BY MOUTH DAILY AS NEEDED FOR ED     Ordered on: 11/24/2017     Authorized by: Penni Homans A     Dispense: 150 tablet     Admin Instructions: TAKE 2 TO 3 TABLETS BY MOUTH DAILY AS NEEDED FOR ED      Will send to office for clarification of orders; also notified Raquel Sarna of need for clarification.  Reason for Disposition . Pharmacy calling with prescription questions and triager unable to answer question  Answer Assessment - Initial Assessment Questions 1. SYMPTOMS: "Do you have any symptoms?"     None; pharmacy called for clarification of orders 2. SEVERITY: If symptoms are present, ask "Are they mild, moderate or severe?"     none  Protocols used: MEDICATION QUESTION CALL-A-AH

## 2017-11-26 NOTE — Progress Notes (Signed)
a 

## 2017-11-29 NOTE — Assessment & Plan Note (Signed)
rx given for Revatio

## 2017-11-29 NOTE — Assessment & Plan Note (Signed)
Patient encouraged to maintain heart healthy diet, regular exercise, adequate sleep. Consider daily probiotics. Take medications as prescribed 

## 2017-11-29 NOTE — Progress Notes (Signed)
Subjective:    Patient ID: Barry Anthony, male    DOB: 10/14/1960, 57 y.o.   MRN: [id number]  Chief Complaint  Patient presents with  . Annual Exam    HPI Patient is in today for annual preventative exam and follow up on chronic medical concerns including hypertension, erectile dysfunction, gout and hyperlipidemia. No recent febrile illness or hospitalizations. He has been walking more and unfortunately has developed increased foot pain as a result. No fall or trauma. No polyuria or polydipsia. Is trying to maintain a heart healthy diet and stay active but due to his pain is moving less he is engaged and happy. He is managing his activities of daily living well. Denies CP/palp/SOB/HA/congestion/fevers/GI or GU c/o. Taking meds as prescribed  Past Medical History:  Diagnosis Date  . Allergic rhinitis   . Allergy   . Bilateral hydrocele 12/08/2012  . GERD (gastroesophageal reflux disease)   . History of chicken pox   . Hyperglycemia 08/15/2008   Qualifier: Diagnosis of  By: Wynona Luna    . Hyperlipidemia   . Hypertension   . Lipoma of shoulder   . Preventative health care 02/12/2014  . Testicular pain, left 08/04/2016    Past Surgical History:  Procedure Laterality Date  . EYE SURGERY    . HERNIA REPAIR     groin  . KNEE SURGERY  1981   right knee  . TONSILLECTOMY  1969  . WISDOM TOOTH EXTRACTION      Family History  Problem Relation Age of Onset  . Thyroid cancer Father   . Hypertension Father   . Coronary artery disease Father   . Hyperlipidemia Father   . Alcohol abuse Father        smoker  . Heart disease Father   . Hypothyroidism Mother   . Dementia Maternal Aunt   . Dementia Maternal Uncle   . Cancer Maternal Grandmother        breast  . Heart disease Paternal Grandmother        heart disease  . Other Unknown        no colon cancer, no prostate canceer  . Colon cancer Neg Hx     Social History   Socioeconomic History  . Marital status: Divorced      Spouse name: Not on file  . Number of children: Not on file  . Years of education: Not on file  . Highest education level: Not on file  Occupational History  . Not on file  Social Needs  . Financial resource strain: Not on file  . Food insecurity:    Worry: Not on file    Inability: Not on file  . Transportation needs:    Medical: Not on file    Non-medical: Not on file  Tobacco Use  . Smoking status: Never Smoker  . Smokeless tobacco: Never Used  Substance and Sexual Activity  . Alcohol use: Yes    Comment: 2 drinks / week  . Drug use: No  . Sexual activity: Yes    Comment: lives with wife, works as a Public house manager at [employer]. no major dietary restrictions  Lifestyle  . Physical activity:    Days per week: Not on file    Minutes per session: Not on file  . Stress: Not on file  Relationships  . Social connections:    Talks on phone: Not on file    Gets together: Not on file    Attends religious  service: Not on file    Active member of club or organization: Not on file    Attends meetings of clubs or organizations: Not on file    Relationship status: Not on file  . Intimate partner violence:    Fear of current or ex partner: Not on file    Emotionally abused: Not on file    Physically abused: Not on file    Forced sexual activity: Not on file  Other Topics Concern  . Not on file  Social History Narrative   Occupation:  Parts mgr   Married 16 yrs    2 children  10, 12    Never Smoked    Alcohol use-yes (social)     Outpatient Medications Prior to Visit  Medication Sig Dispense Refill  . allopurinol (ZYLOPRIM) 100 MG tablet TAKE 1 TABLET (100 MG TOTAL) BY MOUTH DAILY. 90 tablet 1  . atorvastatin (LIPITOR) 20 MG tablet Take 1 tablet (20 mg total) by mouth daily. 90 tablet 1  . fluticasone (FLONASE) 50 MCG/ACT nasal spray Place 2 sprays into both nostrils daily as needed. 48 g 3  . lisinopril (PRINIVIL,ZESTRIL) 40 MG tablet Take 1 tablet (40 mg  total) by mouth daily. 90 tablet 1  . nebivolol (BYSTOLIC) 10 MG tablet Take 1 tablet (10 mg total) by mouth daily. 90 tablet 1  . albuterol (PROAIR HFA) 108 (90 Base) MCG/ACT inhaler INHALE 2 PUFFS INTO THE LUNGS DAILY AS NEEDED 8.5 g 3  . metFORMIN (GLUCOPHAGE) 500 MG tablet Take 500 mg by mouth 1 day or 1 dose.     . ranitidine (ZANTAC) 300 MG capsule Take 1 capsule (300 mg total) by mouth every evening. 90 capsule 1  . sildenafil (REVATIO) 20 MG tablet TAKE 2 TO 3 TABLETS BY MOUTH DAILY AS NEEDED FOR ED 120 tablet 1   No facility-administered medications prior to visit.     No Known Allergies  Review of Systems  Constitutional: Positive for malaise/fatigue. Negative for chills and fever.  HENT: Negative for congestion and hearing loss.   Eyes: Negative for discharge.  Respiratory: Negative for cough, sputum production and shortness of breath.   Cardiovascular: Negative for chest pain, palpitations and leg swelling.  Gastrointestinal: Negative for abdominal pain, blood in stool, constipation, diarrhea, heartburn, nausea and vomiting.  Genitourinary: Negative for dysuria, frequency, hematuria and urgency.  Musculoskeletal: Positive for joint pain. Negative for back pain, falls and myalgias.  Skin: Negative for rash.  Neurological: Negative for dizziness, sensory change, loss of consciousness, weakness and headaches.  Endo/Heme/Allergies: Negative for environmental allergies. Does not bruise/bleed easily.  Psychiatric/Behavioral: Negative for depression and suicidal ideas. The patient is not nervous/anxious and does not have insomnia.        Objective:    Physical Exam  Constitutional: He is oriented to person, place, and time. He appears well-developed and well-nourished. No distress.  HENT:  Head: Normocephalic and atraumatic.  Right Ear: External ear normal.  Left Ear: External ear normal.  Nose: Nose normal.  Eyes: Pupils are equal, round, and reactive to light. EOM are  normal. Right eye exhibits no discharge. Left eye exhibits no discharge.  Neck: Normal range of motion. Neck supple.  Cardiovascular: Normal rate, regular rhythm and normal heart sounds.  No murmur heard. Pulmonary/Chest: Effort normal and breath sounds normal. No respiratory distress.  Abdominal: Soft. Bowel sounds are normal. He exhibits no distension and no mass. There is no tenderness. There is no guarding.  Musculoskeletal: He exhibits no  edema.  Neurological: He is alert and oriented to person, place, and time. He displays normal reflexes. No cranial nerve deficit. He exhibits normal muscle tone.  Skin: Skin is warm and dry. No erythema.  Psychiatric: He has a normal mood and affect.  Nursing note and vitals reviewed.   BP 130/64 (BP Location: Left Arm, Patient Position: Sitting, Cuff Size: Large)   Pulse 76   Temp 98.4 F (36.9 C)   Ht 5\' 10"  (1.778 m)   Wt 300 lb (136.1 kg)   SpO2 95%   BMI 43.05 kg/m  Wt Readings from Last 3 Encounters:  11/24/17 300 lb (136.1 kg)  05/26/17 (!) 306 lb 9.6 oz (139.1 kg)  12/04/16 (!) 309 lb 6.4 oz (140.3 kg)     Lab Results  Component Value Date   WBC 8.3 11/24/2017   HGB 16.6 11/24/2017   HCT 49.5 11/24/2017   PLT 286.0 11/24/2017   GLUCOSE 117 (H) 11/24/2017   CHOL 129 11/24/2017   TRIG 230.0 (H) 11/24/2017   HDL 35.30 (L) 11/24/2017   LDLDIRECT 77.0 11/24/2017   LDLCALC 58 12/27/2015   ALT 29 11/24/2017   AST 22 11/24/2017   NA 139 11/24/2017   K 4.7 11/24/2017   CL 101 11/24/2017   CREATININE 1.15 11/24/2017   BUN 15 11/24/2017   CO2 29 11/24/2017   TSH 2.04 11/24/2017   PSA 1.46 11/24/2017   HGBA1C 6.0 11/24/2017    Lab Results  Component Value Date   TSH 2.04 11/24/2017   Lab Results  Component Value Date   WBC 8.3 11/24/2017   HGB 16.6 11/24/2017   HCT 49.5 11/24/2017   MCV 86.2 11/24/2017   PLT 286.0 11/24/2017   Lab Results  Component Value Date   NA 139 11/24/2017   K 4.7 11/24/2017   CO2 29  11/24/2017   GLUCOSE 117 (H) 11/24/2017   BUN 15 11/24/2017   CREATININE 1.15 11/24/2017   BILITOT 0.4 11/24/2017   ALKPHOS 68 11/24/2017   AST 22 11/24/2017   ALT 29 11/24/2017   PROT 7.0 11/24/2017   ALBUMIN 4.5 11/24/2017   CALCIUM 9.4 11/24/2017   GFR 70.12 11/24/2017   Lab Results  Component Value Date   CHOL 129 11/24/2017   Lab Results  Component Value Date   HDL 35.30 (L) 11/24/2017   Lab Results  Component Value Date   LDLCALC 58 12/27/2015   Lab Results  Component Value Date   TRIG 230.0 (H) 11/24/2017   Lab Results  Component Value Date   CHOLHDL 4 11/24/2017   Lab Results  Component Value Date   HGBA1C 6.0 11/24/2017       Assessment & Plan:   Problem List Items Addressed This Visit    Hyperlipidemia, mixed    Encouraged heart healthy diet, increase exercise, avoid trans fats, consider a krill oil cap daily. Tolerating Atorvastatin      Relevant Medications   sildenafil (REVATIO) 20 MG tablet   Other Relevant Orders   Lipid panel (Completed)   Obesity    Encouraged DASH diet, decrease po intake and increase exercise as tolerated. Needs 7-8 hours of sleep nightly. Avoid trans fats, eat small, frequent meals every 4-5 hours with lean proteins, complex carbs and healthy fats. Minimize simple carbs, Healthy Weight and Wellness is offered.       Essential hypertension    Well controlled, no changes to meds. Encouraged heart healthy diet such as the DASH diet and exercise as tolerated.  Relevant Medications   sildenafil (REVATIO) 20 MG tablet   Other Relevant Orders   CBC (Completed)   Comprehensive metabolic panel (Completed)   TSH (Completed)   Hyperglycemia    hgba1c acceptable, minimize simple carbs. Increase exercise as tolerated.       Relevant Orders   Hemoglobin A1c (Completed)   Gout    No recent flare. Check uric acid      Relevant Orders   Uric acid (Completed)   Annual physical exam    Patient encouraged to maintain  heart healthy diet, regular exercise, adequate sleep. Consider daily probiotics. Take medications as prescribed      Relevant Orders   PSA (Completed)   ED (erectile dysfunction)    rx given for Revatio      Preventative health care    Patient encouraged to maintain heart healthy diet, regular exercise, adequate sleep. Consider daily probiotics. Take medications as prescribed       Other Visit Diagnoses    Other problems related to lifestyle    -  Primary   Relevant Orders   Hepatitis C antibody (Completed)      I have discontinued Daxter E. Gladwell's metFORMIN and ranitidine. I have also changed his sildenafil. Additionally, I am having him start on famotidine, acetaminophen, and ibuprofen. Lastly, I am having him maintain his fluticasone, allopurinol, atorvastatin, lisinopril, nebivolol, and albuterol.  Meds ordered this encounter  Medications  . albuterol (PROAIR HFA) 108 (90 Base) MCG/ACT inhaler    Sig: INHALE 2 PUFFS INTO THE LUNGS DAILY AS NEEDED    Dispense:  8.5 g    Refill:  3  . sildenafil (REVATIO) 20 MG tablet    Sig: Take 1-5 tablets (20-100 mg total) by mouth daily as needed. TAKE 2 TO 3 TABLETS BY MOUTH DAILY AS NEEDED FOR ED    Dispense:  150 tablet    Refill:  2  . famotidine (PEPCID) 40 MG tablet    Sig: Take 1 tablet (40 mg total) by mouth at bedtime as needed for heartburn or indigestion.    Dispense:  90 tablet    Refill:  3  . acetaminophen (TYLENOL) 500 MG tablet    Sig: Take 1-2 tablets (500-1,000 mg total) by mouth every 8 (eight) hours as needed for moderate pain.    Dispense:  100 tablet    Refill:  2  . ibuprofen (ADVIL) 200 MG tablet    Sig: Take 1-2 tablets (200-400 mg total) by mouth every 8 (eight) hours as needed for moderate pain.    Dispense:  100 tablet    Refill:  2     Penni Homans, MD

## 2017-12-01 ENCOUNTER — Ambulatory Visit: Payer: BLUE CROSS/BLUE SHIELD | Admitting: Family

## 2017-12-01 ENCOUNTER — Encounter: Payer: Self-pay | Admitting: Family

## 2017-12-01 ENCOUNTER — Telehealth: Payer: Self-pay | Admitting: Family Medicine

## 2017-12-01 VITALS — BP 135/70 | HR 58 | Temp 98.2°F | Resp 18 | Ht 70.0 in | Wt 305.8 lb

## 2017-12-01 DIAGNOSIS — J209 Acute bronchitis, unspecified: Secondary | ICD-10-CM | POA: Diagnosis not present

## 2017-12-01 MED ORDER — PREDNISONE 10 MG PO TABS: ORAL_TABLET | ORAL | 0 refills | Status: DC

## 2017-12-01 MED ORDER — AZITHROMYCIN 250 MG PO TABS: ORAL_TABLET | ORAL | 0 refills | Status: DC

## 2017-12-01 MED ORDER — SILDENAFIL CITRATE 20 MG PO TABS: 20.0000 mg | ORAL_TABLET | Freq: Every day | ORAL | 2 refills | Status: DC | PRN

## 2017-12-01 MED FILL — predniSONE 10 MG TABS: 10 | 8 days supply | Qty: 20 | Fill #0

## 2017-12-01 MED FILL — AZITHROMYCIN 250 MG TABLET: 250 | 5 days supply | Qty: 6 | Fill #0

## 2017-12-01 NOTE — Telephone Encounter (Signed)
Copied from [facility] [id number]. Topic: Quick Communication - Rx Refill/Question >> Dec 01, 2017  9:33 AM Barry Anthony, NT wrote: Medication: sildenafil (REVATIO) 20 MG tablet ,( the pharmacy called and states there are 2 different instructions and they would like a call to clarify )  Has the patient contacted their pharmacy? yes  (Agent: If no, request that the patient contact the pharmacy for the refill. (Agent: If yes, when and what did the pharmacy advise?  Preferred Pharmacy (with phone number or street name [facility] [address], [place] [phone] (Phone) [phone] (Fax    Agent: Please be advised that RX refills may take up to 3 business days. We ask that you follow-up with your pharmacy.

## 2017-12-01 NOTE — Telephone Encounter (Signed)
See nurse triage note from 11/26/17.

## 2017-12-01 NOTE — Patient Instructions (Signed)
Please begin azihromycin, prednisone taper and continue albuterol 2 puffs every 6 hours. Call if symptoms worsen or if not improved in 3-4.

## 2017-12-01 NOTE — Telephone Encounter (Signed)
Pt seen in office today and requested status of sildenafil Rx. Rx has been re-sent with corrected directions. Pt is aware that RX was re-sent.

## 2017-12-01 NOTE — Progress Notes (Signed)
Subjective:    Patient ID: Barry Anthony, male    DOB: 1960-02-24, 57 y.o.   MRN: [id number]  HPI   Patient is a 57 yr old male who presents today with chief complaint of chest congestion/wheezing. Denies lethargy, fever, or head congestion.  + Cough productive of grey/yellow sputum.  Symptoms began on 11/22. Reports that he has hx of asthma which typically only bothers him in the cold weather.     Review of Systems    see HPI  Past Medical History:  Diagnosis Date  . Allergic rhinitis   . Allergy   . Bilateral hydrocele 12/08/2012  . GERD (gastroesophageal reflux disease)   . History of chicken pox   . Hyperglycemia 08/15/2008   Qualifier: Diagnosis of  By: Wynona Luna    . Hyperlipidemia   . Hypertension   . Lipoma of shoulder   . Preventative health care 02/12/2014  . Testicular pain, left 08/04/2016     Social History   Socioeconomic History  . Marital status: Divorced    Spouse name: Not on file  . Number of children: Not on file  . Years of education: Not on file  . Highest education level: Not on file  Occupational History  . Not on file  Social Needs  . Financial resource strain: Not on file  . Food insecurity:    Worry: Not on file    Inability: Not on file  . Transportation needs:    Medical: Not on file    Non-medical: Not on file  Tobacco Use  . Smoking status: Never Smoker  . Smokeless tobacco: Never Used  Substance and Sexual Activity  . Alcohol use: Yes    Comment: 2 drinks / week  . Drug use: No  . Sexual activity: Yes    Comment: lives with wife, works as a Public house manager at [employer]. no major dietary restrictions  Lifestyle  . Physical activity:    Days per week: Not on file    Minutes per session: Not on file  . Stress: Not on file  Relationships  . Social connections:    Talks on phone: Not on file    Gets together: Not on file    Attends religious service: Not on file    Active member of club or organization: Not on  file    Attends meetings of clubs or organizations: Not on file    Relationship status: Not on file  . Intimate partner violence:    Fear of current or ex partner: Not on file    Emotionally abused: Not on file    Physically abused: Not on file    Forced sexual activity: Not on file  Other Topics Concern  . 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 EYE SURGERY    . HERNIA REPAIR     groin  . KNEE SURGERY  1981   right knee  . TONSILLECTOMY  1969  . WISDOM TOOTH EXTRACTION      Family History  Problem Relation Age of Onset  . Thyroid cancer Father   . Hypertension Father   . Coronary artery disease Father   . Hyperlipidemia Father   . Alcohol abuse Father  smoker  . Heart disease Father   . Hypothyroidism Mother   . Dementia Maternal Aunt   . Dementia Maternal Uncle   . Cancer Maternal Grandmother        breast  . Heart disease Paternal Grandmother        heart disease  . Other Unknown        no colon cancer, no prostate canceer  . Colon cancer Neg Hx     No Known Allergies  Current Outpatient Medications on File Prior to Visit  Medication Sig Dispense Refill  . acetaminophen (TYLENOL) 500 MG tablet Take 1-2 tablets (500-1,000 mg total) by mouth every 8 (eight) hours as needed for moderate pain. 100 tablet 2  . albuterol (PROAIR HFA) 108 (90 Base) MCG/ACT inhaler INHALE 2 PUFFS INTO THE LUNGS DAILY AS NEEDED 8.5 g 3  . allopurinol (ZYLOPRIM) 100 MG tablet TAKE 1 TABLET (100 MG TOTAL) BY MOUTH DAILY. 90 tablet 1  . atorvastatin (LIPITOR) 40 MG tablet Take 1 tablet (40 mg total) by mouth daily. 30 tablet 3  . famotidine (PEPCID) 40 MG tablet Take 1 tablet (40 mg total) by mouth at bedtime as needed for heartburn or indigestion. 90 tablet 3  . fluticasone (FLONASE) 50 MCG/ACT nasal spray Place 2  sprays into both nostrils daily as needed. 48 g 3  . ibuprofen (ADVIL) 200 MG tablet Take 1-2 tablets (200-400 mg total) by mouth every 8 (eight) hours as needed for moderate pain. 100 tablet 2  . lisinopril (PRINIVIL,ZESTRIL) 40 MG tablet Take 1 tablet (40 mg total) by mouth daily. 90 tablet 1  . nebivolol (BYSTOLIC) 10 MG tablet Take 1 tablet (10 mg total) by mouth daily. 90 tablet 1   No current facility-administered medications on file prior to visit.     BP 135/70 (BP Location: Right Arm, Cuff Size: Large)   Pulse (!) 58   Temp 98.2 F (36.8 C) (Oral)   Resp 18   Ht 5\' 10"  (1.778 m)   Wt (!) 305 lb 12.8 oz (138.7 kg)   SpO2 97%   BMI 43.88 kg/m    Objective:   Physical Exam  Constitutional: He is oriented to person, place, and time. He appears well-developed and well-nourished. No distress.  HENT:  Head: Normocephalic and atraumatic.  Right Ear: Tympanic membrane and ear canal normal.  Left Ear: Tympanic membrane and ear canal normal.  Cardiovascular: Normal rate and regular rhythm.  No murmur heard. Pulmonary/Chest: Effort normal. No respiratory distress. He has wheezes. He has no rales.  Musculoskeletal: He exhibits no edema.  Neurological: He is alert and oriented to person, place, and time.  Skin: Skin is warm and dry.  Psychiatric: He has a normal mood and affect. His behavior is normal. Thought content normal.          Assessment & Plan:  Bronchitis with bronchospasm- advised pt as follows:  Please begin azirthromycin, prednisone taper and continue albuterol 2 puffs every 6 hours. Call if symptoms worsen or if not improved in 3-4 days.

## 2017-12-01 NOTE — Telephone Encounter (Signed)
Author phoned [facility] to clarify sig of revatio, but pharmacy closed, opens up again at [place]. Routed to [place], [place] to reach out to pharmacy again.

## 2017-12-17 ENCOUNTER — Other Ambulatory Visit: Payer: Self-pay | Admitting: Family Medicine

## 2017-12-18 MED FILL — BYSTOLIC 10 MG TABLET: 10 | 90 days supply | Qty: 90 | Fill #0

## 2018-02-19 ENCOUNTER — Ambulatory Visit (HOSPITAL_BASED_OUTPATIENT_CLINIC_OR_DEPARTMENT_OTHER)
Admission: RE | Admit: 2018-02-19 | Discharge: 2018-02-19 | Disposition: A | Discharge status: Home / Self Care | Payer: BLUE CROSS/BLUE SHIELD | Source: Ambulatory Visit | Attending: Family | Admitting: Family

## 2018-02-19 ENCOUNTER — Ambulatory Visit: Payer: BLUE CROSS/BLUE SHIELD | Admitting: Family

## 2018-02-19 ENCOUNTER — Encounter: Payer: Self-pay | Admitting: Family

## 2018-02-19 VITALS — BP 123/60 | HR 69 | Temp 98.9°F | Resp 16 | Ht 70.0 in | Wt 308.6 lb

## 2018-02-19 DIAGNOSIS — J40 Bronchitis, not specified as acute or chronic: Secondary | ICD-10-CM | POA: Insufficient documentation

## 2018-02-19 DIAGNOSIS — R05 Cough: Secondary | ICD-10-CM | POA: Diagnosis not present

## 2018-02-19 MED ORDER — AZITHROMYCIN 250 MG PO TABS: ORAL_TABLET | ORAL | 0 refills | Status: DC

## 2018-02-19 MED FILL — AZITHROMYCIN 250 MG TABLET: 250 | 5 days supply | Qty: 6 | Fill #0

## 2018-02-19 NOTE — Patient Instructions (Signed)
Please begin azithromycin (Zpak). Continue albuterol 2 puffs every 6 hrs as needed. Add mucinex 600mg  twice daily to help with chest congestion.  Complete chest x-ray on the first floor. Call if symptoms worsen or if not improved in 3-4 days.

## 2018-02-19 NOTE — Progress Notes (Signed)
Subjective:    Patient ID: Barry Anthony, male    DOB: April 26, 1960, 58 y.o.   MRN: [id number]  HPI   Patient is a 58y old male who presents today with chief complaint of cough. Reports that cough worsened about 3 weeks ago.  Sleeping well. Does not feel sick other than the cough. Cough has been productive of grey/green/brown sputum.  No fever. Denies SOB.   Review of Systems    see HPI  Past Medical History:  Diagnosis Date  . Allergic rhinitis   . Allergy   . Bilateral hydrocele 12/08/2012  . GERD (gastroesophageal reflux disease)   . History of chicken pox   . Hyperglycemia 08/15/2008   Qualifier: Diagnosis of  By: Wynona Luna    . Hyperlipidemia   . Hypertension   . Lipoma of shoulder   . Preventative health care 02/12/2014  . Testicular pain, left 08/04/2016     Social History   Socioeconomic History  . Marital status: Divorced    Spouse name: Not on file  . Number of children: Not on file  . Years of education: Not on file  . Highest education level: Not on file  Occupational History  . Not on file  Social Needs  . Financial resource strain: Not on file  . Food insecurity:    Worry: Not on file    Inability: Not on file  . Transportation needs:    Medical: Not on file    Non-medical: Not on file  Tobacco Use  . Smoking status: Never Smoker  . Smokeless tobacco: Never Used  Substance and Sexual Activity  . Alcohol use: Yes    Comment: 2 drinks / week  . Drug use: No  . Sexual activity: Yes    Comment: lives with wife, works as a Public house manager at [employer]. no major dietary restrictions  Lifestyle  . Physical activity:    Days per week: Not on file    Minutes per session: Not on file  . Stress: Not on file  Relationships  . Social connections:    Talks on phone: Not on file    Gets together: Not on file    Attends religious service: Not on file    Active member of club or organization: Not on file    Attends meetings of clubs or  organizations: Not on file    Relationship status: Not on file  . Intimate partner violence:    Fear of current or ex partner: Not on file    Emotionally abused: Not on file    Physically abused: Not on file    Forced sexual activity: Not on file  Other Topics Concern  . 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 EYE SURGERY    . HERNIA REPAIR     groin  . KNEE SURGERY  1981   right knee  . TONSILLECTOMY  1969  . WISDOM TOOTH EXTRACTION      Family History  Problem Relation Age of Onset  . Thyroid cancer Father   . Hypertension Father   . Coronary artery disease Father   . Hyperlipidemia Father   . Alcohol abuse Father        smoker  .  Heart disease Father   . Hypothyroidism Mother   . Dementia Maternal Aunt   . Dementia Maternal Uncle   . Cancer Maternal Grandmother        breast  . Heart disease Paternal Grandmother        heart disease  . Other Unknown        no colon cancer, no prostate canceer  . Colon cancer Neg Hx     No Known Allergies  Current Outpatient Medications on File Prior to Visit  Medication Sig Dispense Refill  . acetaminophen (TYLENOL) 500 MG tablet Take 1-2 tablets (500-1,000 mg total) by mouth every 8 (eight) hours as needed for moderate pain. 100 tablet 2  . albuterol (PROAIR HFA) 108 (90 Base) MCG/ACT inhaler INHALE 2 PUFFS INTO THE LUNGS DAILY AS NEEDED 8.5 g 3  . allopurinol (ZYLOPRIM) 100 MG tablet TAKE 1 TABLET (100 MG TOTAL) BY MOUTH DAILY. 90 tablet 1  . BYSTOLIC 10 MG tablet TAKE 1 TABLET (10 MG TOTAL) BY MOUTH DAILY. 90 tablet 1  . famotidine (PEPCID) 40 MG tablet Take 1 tablet (40 mg total) by mouth at bedtime as needed for heartburn or indigestion. 90 tablet 3  . fluticasone (FLONASE) 50 MCG/ACT nasal spray Place 2 sprays into both nostrils daily as needed. 48 g 3  .  ibuprofen (ADVIL) 200 MG tablet Take 1-2 tablets (200-400 mg total) by mouth every 8 (eight) hours as needed for moderate pain. 100 tablet 2  . lisinopril (PRINIVIL,ZESTRIL) 40 MG tablet Take 1 tablet (40 mg total) by mouth daily. 90 tablet 1  . sildenafil (REVATIO) 20 MG tablet Take 1-5 tablets (20-100 mg total) by mouth daily as needed. 150 tablet 2  . atorvastatin (LIPITOR) 40 MG tablet Take 1 tablet (40 mg total) by mouth daily. 30 tablet 3   No current facility-administered medications on file prior to visit.     BP 123/60 (BP Location: Right Arm, Patient Position: Sitting, Cuff Size: Large)   Pulse 69   Temp 98.9 F (37.2 C) (Oral)   Resp 16   Ht 5\' 10"  (1.778 m)   Wt (!) 308 lb 9.6 oz (140 kg)   SpO2 96%   BMI 44.28 kg/m    Objective:   Physical Exam Constitutional:      General: He is not in acute distress.    Appearance: He is well-developed.  HENT:     Head: Normocephalic and atraumatic.     Right Ear: Tympanic membrane and ear canal normal.     Left Ear: Tympanic membrane normal.  Cardiovascular:     Rate and Rhythm: Normal rate and regular rhythm.     Heart sounds: No murmur.  Pulmonary:     Effort: Pulmonary effort is normal. No respiratory distress.     Breath sounds: Rhonchi present. No wheezing or rales.  Skin:    General: Skin is warm and dry.  Neurological:     Mental Status: He is alert and oriented to person, place, and time.  Psychiatric:        Behavior: Behavior normal.        Thought Content: Thought content normal.           Assessment & Plan:  Bronchitis- will rx with zpak, albuterol. Obtain cxr to rule out pna. Advised mucinex for chest congestion.

## 2018-03-05 ENCOUNTER — Other Ambulatory Visit: Payer: Self-pay | Admitting: Family Medicine

## 2018-03-05 MED FILL — LISINOPRIL 40 MG TABLET: 40 | 90 days supply | Qty: 90 | Fill #0

## 2018-03-05 MED FILL — ALLOPURINOL 100 MG TABS: 100 | 90 days supply | Qty: 90 | Fill #0

## 2018-03-09 ENCOUNTER — Ambulatory Visit (HOSPITAL_BASED_OUTPATIENT_CLINIC_OR_DEPARTMENT_OTHER)
Admission: RE | Admit: 2018-03-09 | Discharge: 2018-03-09 | Disposition: A | Discharge status: Home / Self Care | Payer: BLUE CROSS/BLUE SHIELD | Source: Ambulatory Visit | Attending: Family | Admitting: Family

## 2018-03-09 ENCOUNTER — Encounter: Payer: Self-pay | Admitting: Family

## 2018-03-09 ENCOUNTER — Ambulatory Visit: Payer: BLUE CROSS/BLUE SHIELD | Admitting: Family

## 2018-03-09 VITALS — BP 113/63 | HR 74 | Temp 98.5°F | Resp 16 | Ht 70.0 in | Wt 307.0 lb

## 2018-03-09 DIAGNOSIS — R062 Wheezing: Secondary | ICD-10-CM | POA: Diagnosis not present

## 2018-03-09 DIAGNOSIS — R05 Cough: Secondary | ICD-10-CM | POA: Insufficient documentation

## 2018-03-09 DIAGNOSIS — K219 Gastro-esophageal reflux disease without esophagitis: Secondary | ICD-10-CM

## 2018-03-09 DIAGNOSIS — R059 Cough, unspecified: Secondary | ICD-10-CM

## 2018-03-09 LAB — POC INFLUENZA A&B (BINAX/QUICKVUE)
Influenza A, POC: NEGATIVE
Influenza B, POC: NEGATIVE

## 2018-03-09 MED ORDER — BUDESONIDE-FORMOTEROL FUMARATE 80-4.5 MCG/ACT IN AERO: 2.0000 | INHALATION_SPRAY | Freq: Two times a day (BID) | RESPIRATORY_TRACT | 3 refills | Status: DC

## 2018-03-09 MED ORDER — OMEPRAZOLE 40 MG PO CPDR: 40.0000 mg | DELAYED_RELEASE_CAPSULE | Freq: Every day | ORAL | 3 refills | Status: DC

## 2018-03-09 MED FILL — OMEPRAZOLE 40 MG CPDR: 40 | 30 days supply | Qty: 30 | Fill #0

## 2018-03-09 MED FILL — SYMBICORT 80-4.5 MCG INH: 80-4.5 | 30 days supply | Qty: 10 | Fill #0

## 2018-03-09 MED FILL — VENTOLIN HFA 90 MCG INHALER: 108 (90 BAS | 90 days supply | Qty: 18 | Fill #1

## 2018-03-09 NOTE — Progress Notes (Signed)
Subjective:    Patient ID: Barry Anthony, male    DOB: 1960/04/09, 58 y.o.   MRN: [id number]  HPI  Patient is a 58 year old male who presents today with chief complaint of cough.  Reports that this cough is been off and on for approximately 3 months.  He has been treated twice for bronchitis once on 1126 and another time on 02/19/2018.  In between these acute visits his overall symptoms have improved but he has continued to have a cough.  He reports that a few days ago his symptoms seem to worsen.  Reports temp this AM was 99.1.  Reports + aching, cold symptoms, like I am getting sick.  Uses albuterol which makes cough worse for a minute then better.   Reports increased gerd symptoms despite pepcid most nights.    Review of Systems Past Medical History:  Diagnosis Date  . Allergic rhinitis   . Allergy   . Bilateral hydrocele 12/08/2012  . GERD (gastroesophageal reflux disease)   . History of chicken pox   . Hyperglycemia 08/15/2008   Qualifier: Diagnosis of  By: Wynona Luna    . Hyperlipidemia   . Hypertension   . Lipoma of shoulder   . Preventative health care 02/12/2014  . Testicular pain, left 08/04/2016     Social History   Socioeconomic History  . Marital status: Divorced    Spouse name: Not on file  . Number of children: Not on file  . Years of education: Not on file  . Highest education level: Not on file  Occupational History  . Not on file  Social Needs  . Financial resource strain: Not on file  . Food insecurity:    Worry: Not on file    Inability: Not on file  . Transportation needs:    Medical: Not on file    Non-medical: Not on file  Tobacco Use  . Smoking status: Never Smoker  . Smokeless tobacco: Never Used  Substance and Sexual Activity  . Alcohol use: Yes    Comment: 2 drinks / week  . Drug use: No  . Sexual activity: Yes    Comment: lives with wife, works as a Public house manager at [employer]. no major dietary restrictions  Lifestyle  .  Physical activity:    Days per week: Not on file    Minutes per session: Not on file  . Stress: Not on file  Relationships  . Social connections:    Talks on phone: Not on file    Gets together: Not on file    Attends religious service: Not on file    Active member of club or organization: Not on file    Attends meetings of clubs or organizations: Not on file    Relationship status: Not on file  . Intimate partner violence:    Fear of current or ex partner: Not on file    Emotionally abused: Not on file    Physically abused: Not on file    Forced sexual activity: Not on file  Other Topics Concern  . 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 EYE SURGERY    . HERNIA REPAIR     groin  . [place]  right knee  . TONSILLECTOMY  1969  . WISDOM TOOTH EXTRACTION      Family History  Problem Relation Age of Onset  . Thyroid cancer Father   . Hypertension Father   . Coronary artery disease Father   . Hyperlipidemia Father   . Alcohol abuse Father        smoker  . Heart disease Father   . Hypothyroidism Mother   . Dementia Maternal Aunt   . Dementia Maternal Uncle   . Cancer Maternal Grandmother        breast  . Heart disease Paternal Grandmother        heart disease  . Other Unknown        no colon cancer, no prostate canceer  . Colon cancer Neg Hx     No Known Allergies  Current Outpatient Medications on File Prior to Visit  Medication Sig Dispense Refill  . acetaminophen (TYLENOL) 500 MG tablet Take 1-2 tablets (500-1,000 mg total) by mouth every 8 (eight) hours as needed for moderate pain. 100 tablet 2  . albuterol (PROAIR HFA) 108 (90 Base) MCG/ACT inhaler INHALE 2 PUFFS INTO THE LUNGS DAILY AS NEEDED 8.5 g 3  . allopurinol (ZYLOPRIM) 100 MG tablet TAKE 1 TABLET (100 MG TOTAL) BY MOUTH  DAILY. 90 tablet 3  . atorvastatin (LIPITOR) 40 MG tablet Take 1 tablet (40 mg total) by mouth daily. 30 tablet 3  . BYSTOLIC 10 MG tablet TAKE 1 TABLET (10 MG TOTAL) BY MOUTH DAILY. 90 tablet 1  . famotidine (PEPCID) 40 MG tablet Take 1 tablet (40 mg total) by mouth at bedtime as needed for heartburn or indigestion. 90 tablet 3  . fluticasone (FLONASE) 50 MCG/ACT nasal spray Place 2 sprays into both nostrils daily as needed. 48 g 3  . ibuprofen (ADVIL) 200 MG tablet Take 1-2 tablets (200-400 mg total) by mouth every 8 (eight) hours as needed for moderate pain. 100 tablet 2  . lisinopril (PRINIVIL,ZESTRIL) 40 MG tablet TAKE 1 TABLET (40 MG TOTAL) BY MOUTH DAILY. 90 tablet 3  . sildenafil (REVATIO) 20 MG tablet Take 1-5 tablets (20-100 mg total) by mouth daily as needed. 150 tablet 2   No current facility-administered medications on file prior to visit.     BP 113/63 (BP Location: Right Arm, Patient Position: Sitting, Cuff Size: Large)   Pulse 74   Temp 98.5 F (36.9 C) (Oral)   Resp 16   Ht 5\' 10"  (1.778 m)   Wt (!) 307 lb (139.3 kg)   SpO2 96%   BMI 44.05 kg/m       Objective:   Physical Exam Constitutional:      General: He is not in acute distress.    Appearance: He is well-developed.  HENT:     Head: Normocephalic and atraumatic.     Right Ear: Tympanic membrane and ear canal normal.     Left Ear: Tympanic membrane and ear canal normal.     Mouth/Throat:     Pharynx: Posterior oropharyngeal erythema present. No oropharyngeal exudate.  Cardiovascular:     Rate and Rhythm: Normal rate and regular rhythm.     Heart sounds: No murmur.  Pulmonary:     Effort: Pulmonary effort is normal. No respiratory distress.     Breath sounds: Examination of the right-lower field reveals wheezing. Wheezing (soft) present. No rales.  Skin:    General: Skin is warm and dry.  Neurological:     Mental Status: He is alert  and oriented to person, place, and time.  Psychiatric:         Behavior: Behavior normal.        Thought Content: Thought content normal.           Assessment & Plan:  Cough- flu swab is negative.  Suspect this is multifactorial (asthma, GERD, and possible viral URI overlay) a chest x-ray was performed today which was negative for pneumonia.  I will step up his GERD therapy from Pepcid to omeprazole.  In addition due to some wheezing today I will add Symbicort to his regimen.  I have suggested that he follow-up with his primary care in 1 month.  At that time if symptoms of cough are not improved would need to consider cough secondary to ACE inhibitor.  In the meantime he is advised to call if symptoms worsen or if they fail to improve.

## 2018-03-09 NOTE — Patient Instructions (Signed)
Stop pepcid start omeprazole for reflux. Start symbicort 2 puffs twice daily. Complete chest x-ray on the first floor. Call if symptoms worsen, if fever >101, of if not improved in 3-4 days.

## 2018-04-06 MED FILL — BYSTOLIC 10 MG TABLET: 10 | 90 days supply | Qty: 90 | Fill #1

## 2018-04-06 MED FILL — SYMBICORT 80-4.5 MCG INH: 80-4.5 | 30 days supply | Qty: 10 | Fill #1

## 2018-04-12 ENCOUNTER — Encounter: Payer: Self-pay | Admitting: Family Medicine

## 2018-04-12 DIAGNOSIS — E782 Mixed hyperlipidemia: Secondary | ICD-10-CM

## 2018-04-12 MED ORDER — ATORVASTATIN CALCIUM 40 MG PO TABS: 40.0000 mg | ORAL_TABLET | Freq: Every day | ORAL | 0 refills | Status: DC

## 2018-04-12 MED FILL — ATORVASTATIN 40 MG TABLET: 40 | 90 days supply | Qty: 90 | Fill #0

## 2018-04-12 MED FILL — OMEPRAZOLE 40 MG CPDR: 40 | 30 days supply | Qty: 30 | Fill #1

## 2018-05-17 MED FILL — OMEPRAZOLE 40 MG CPDR: 40 | 30 days supply | Qty: 30 | Fill #2

## 2018-05-25 ENCOUNTER — Other Ambulatory Visit: Payer: Self-pay

## 2018-05-25 ENCOUNTER — Ambulatory Visit (INDEPENDENT_AMBULATORY_CARE_PROVIDER_SITE_OTHER): Payer: BLUE CROSS/BLUE SHIELD | Admitting: Family Medicine

## 2018-05-25 DIAGNOSIS — E669 Obesity, unspecified: Secondary | ICD-10-CM

## 2018-05-25 DIAGNOSIS — N529 Male erectile dysfunction, unspecified: Secondary | ICD-10-CM

## 2018-05-25 DIAGNOSIS — M5432 Sciatica, left side: Secondary | ICD-10-CM

## 2018-05-25 DIAGNOSIS — E782 Mixed hyperlipidemia: Secondary | ICD-10-CM

## 2018-05-25 DIAGNOSIS — M5430 Sciatica, unspecified side: Secondary | ICD-10-CM | POA: Insufficient documentation

## 2018-05-25 DIAGNOSIS — I10 Essential (primary) hypertension: Secondary | ICD-10-CM

## 2018-05-25 DIAGNOSIS — R739 Hyperglycemia, unspecified: Secondary | ICD-10-CM | POA: Diagnosis not present

## 2018-05-25 MED ORDER — SILDENAFIL CITRATE 20 MG PO TABS: 20.0000 mg | ORAL_TABLET | Freq: Every day | ORAL | 2 refills | Status: DC | PRN

## 2018-05-25 MED ORDER — TIZANIDINE HCL 4 MG PO TABS: 2.0000 mg | ORAL_TABLET | Freq: Three times a day (TID) | ORAL | 1 refills | Status: DC | PRN

## 2018-05-25 MED ORDER — METHYLPREDNISOLONE 4 MG PO TABS: ORAL_TABLET | ORAL | 0 refills | Status: DC

## 2018-05-25 MED FILL — tiZANidine HCL 4 MG TABS: 4 | 10 days supply | Qty: 30 | Fill #0

## 2018-05-25 MED FILL — METHYLPREDNISOLONE 4 MG TAB: 4 | 5 days supply | Qty: 15 | Fill #0

## 2018-05-25 NOTE — Assessment & Plan Note (Signed)
Encouraged moist heat and gentle stretching as tolerated. May try NSAIDs and prescription meds as directed and report if symptoms worsen or seek immediate care. Medrol dospak and Tizanidine 4 mg po bid prn

## 2018-05-25 NOTE — Assessment & Plan Note (Signed)
no changes to meds. Encouraged heart healthy diet such as the DASH diet and exercise as tolerated. Encouraged to check vitals weekly and record. Call with any concerns.

## 2018-05-25 NOTE — Assessment & Plan Note (Signed)
Encouraged heart healthy diet, increase exercise, avoid trans fats, consider a krill oil cap daily 

## 2018-05-25 NOTE — Assessment & Plan Note (Signed)
Encouraged DASH diet, decrease po intake and increase exercise as tolerated. Needs 7-8 hours of sleep nightly. Avoid trans fats, eat small, frequent meals every 4-5 hours with lean proteins, complex carbs and healthy fats. Minimize simple carbs 

## 2018-05-25 NOTE — Assessment & Plan Note (Signed)
hgba1c acceptable, minimize simple carbs. Increase exercise as tolerated.  

## 2018-05-25 NOTE — Progress Notes (Signed)
Virtual Visit via Video Note  I connected with [name] on 05/25/18 at  1:20 PM EDT by a video enabled telemedicine application and verified that I am speaking with the correct person using two identifiers.  Location: Patient: home Provider: home   I discussed the limitations of evaluation and management by telemedicine and the availability of in person appointments. The patient expressed understanding and agreed to proceed.    Subjective:    Patient ID: Barry Anthony, male    DOB: 01-04-1961, 58 y.o.   MRN: [id number]  No chief complaint on file.   HPI Patient is in today for follow up on chronic concerns including hypertension, hyperlipidemia, obesity, erectile dysfunction and more.  He needs a refill on his sildenafil.  He is noting about 3 weeks worth of low back pain/left hip pain with radicular symptoms down the left leg.  He believes he jolted his leg slightly and then several days later began having intermittent sharp pains in his posterior left hip that with certain movements will radiate down the left leg.  No incontinence.  He can get into a comfortable position but then with movement his pain returns.  Otherwise he feels well.  No recent febrile illness or hospitalizations.  He is managing the pandemic well and is continuing to work but otherwise self isolating.  Trying to stay active despite his hip pain. Denies CP/palp/SOB/HA/congestion/fevers/GI or GU c/o. Taking meds as prescribed  Past Medical History:  Diagnosis Date  . Allergic rhinitis   . Allergy   . Bilateral hydrocele 12/08/2012  . GERD (gastroesophageal reflux disease)   . History of chicken pox   . Hyperglycemia 08/15/2008   Qualifier: Diagnosis of  By: Wynona Luna    . Hyperlipidemia   . Hypertension   . Lipoma of shoulder   . Preventative health care 02/12/2014  . Testicular pain, left 08/04/2016    Past Surgical History:  Procedure Laterality Date  . EYE SURGERY    . HERNIA REPAIR     groin   . KNEE SURGERY  1981   right knee  . TONSILLECTOMY  1969  . WISDOM TOOTH EXTRACTION      Family History  Problem Relation Age of Onset  . Thyroid cancer Father   . Hypertension Father   . Coronary artery disease Father   . Hyperlipidemia Father   . Alcohol abuse Father        smoker  . Heart disease Father   . Hypothyroidism Mother   . Dementia Maternal Aunt   . Dementia Maternal Uncle   . Cancer Maternal Grandmother        breast  . Heart disease Paternal Grandmother        heart disease  . Other Unknown        no colon cancer, no prostate canceer  . Colon cancer Neg Hx     Social History   Socioeconomic History  . Marital status: Divorced    Spouse name: Not on file  . Number of children: Not on file  . Years of education: Not on file  . Highest education level: Not on file  Occupational History  . Not on file  Social Needs  . Financial resource strain: Not on file  . Food insecurity:    Worry: Not on file    Inability: Not on file  . Transportation needs:    Medical: Not on file    Non-medical: Not on file  Tobacco Use  .  Smoking status: Never Smoker  . Smokeless tobacco: Never Used  Substance and Sexual Activity  . Alcohol use: Yes    Comment: 2 drinks / week  . Drug use: No  . Sexual activity: Yes    Comment: lives with wife, works as a Public house manager at [employer]. no major dietary restrictions  Lifestyle  . Physical activity:    Days per week: Not on file    Minutes per session: Not on file  . Stress: Not on file  Relationships  . Social connections:    Talks on phone: Not on file    Gets together: Not on file    Attends religious service: Not on file    Active member of club or organization: Not on file    Attends meetings of clubs or organizations: Not on file    Relationship status: Not on file  . Intimate partner violence:    Fear of current or ex partner: Not on file    Emotionally abused: Not on file    Physically abused:  Not on file    Forced sexual activity: Not on file  Other Topics Concern  . Not on file  Social History Narrative   Occupation:  Parts mgr   Married 16 yrs    2 children  10, 12    Never Smoked    Alcohol use-yes (social)     Outpatient Medications Prior to Visit  Medication Sig Dispense Refill  . acetaminophen (TYLENOL) 500 MG tablet Take 1-2 tablets (500-1,000 mg total) by mouth every 8 (eight) hours as needed for moderate pain. 100 tablet 2  . albuterol (PROAIR HFA) 108 (90 Base) MCG/ACT inhaler INHALE 2 PUFFS INTO THE LUNGS DAILY AS NEEDED 8.5 g 3  . allopurinol (ZYLOPRIM) 100 MG tablet TAKE 1 TABLET (100 MG TOTAL) BY MOUTH DAILY. 90 tablet 3  . atorvastatin (LIPITOR) 40 MG tablet Take 1 tablet (40 mg total) by mouth daily. 90 tablet 0  . budesonide-formoterol (SYMBICORT) 80-4.5 MCG/ACT inhaler Inhale 2 puffs into the lungs 2 (two) times daily. 1 Inhaler 3  . BYSTOLIC 10 MG tablet TAKE 1 TABLET (10 MG TOTAL) BY MOUTH DAILY. 90 tablet 1  . fluticasone (FLONASE) 50 MCG/ACT nasal spray Place 2 sprays into both nostrils daily as needed. 48 g 3  . ibuprofen (ADVIL) 200 MG tablet Take 1-2 tablets (200-400 mg total) by mouth every 8 (eight) hours as needed for moderate pain. 100 tablet 2  . lisinopril (PRINIVIL,ZESTRIL) 40 MG tablet TAKE 1 TABLET (40 MG TOTAL) BY MOUTH DAILY. 90 tablet 3  . omeprazole (PRILOSEC) 40 MG capsule Take 1 capsule (40 mg total) by mouth daily. 30 capsule 3  . sildenafil (REVATIO) 20 MG tablet Take 1-5 tablets (20-100 mg total) by mouth daily as needed. 150 tablet 2   No facility-administered medications prior to visit.     No Known Allergies  Review of Systems  Constitutional: Negative for fever and malaise/fatigue.  HENT: Negative for congestion.   Eyes: Negative for blurred vision.  Respiratory: Negative for shortness of breath.   Cardiovascular: Negative for chest pain, palpitations and leg swelling.  Gastrointestinal: Negative for abdominal pain, blood  in stool and nausea.  Genitourinary: Negative for dysuria and frequency.  Musculoskeletal: Positive for back pain and joint pain. Negative for falls.  Skin: Negative for rash.  Neurological: Negative for dizziness, loss of consciousness and headaches.  Endo/Heme/Allergies: Negative for environmental allergies.  Psychiatric/Behavioral: Negative for depression. The patient is not nervous/anxious.  Objective:    Physical Exam Constitutional:      Appearance: Normal appearance. He is obese. He is not ill-appearing.  HENT:     Head: Normocephalic and atraumatic.     Nose: Nose normal.  Eyes:     General:        Right eye: No discharge.        Left eye: No discharge.  Pulmonary:     Effort: Pulmonary effort is normal.  Neurological:     Mental Status: He is alert and oriented to person, place, and time.  Psychiatric:        Mood and Affect: Mood normal.        Behavior: Behavior normal.     There were no vitals taken for this visit. Wt Readings from Last 3 Encounters:  03/09/18 (!) 307 lb (139.3 kg)  02/19/18 (!) 308 lb 9.6 oz (140 kg)  12/01/17 (!) 305 lb 12.8 oz (138.7 kg)    Diabetic Foot Exam - Simple   No data filed     Lab Results  Component Value Date   WBC 8.3 11/24/2017   HGB 16.6 11/24/2017   HCT 49.5 11/24/2017   PLT 286.0 11/24/2017   GLUCOSE 117 (H) 11/24/2017   CHOL 129 11/24/2017   TRIG 230.0 (H) 11/24/2017   HDL 35.30 (L) 11/24/2017   LDLDIRECT 77.0 11/24/2017   LDLCALC 58 12/27/2015   ALT 29 11/24/2017   AST 22 11/24/2017   NA 139 11/24/2017   K 4.7 11/24/2017   CL 101 11/24/2017   CREATININE 1.15 11/24/2017   BUN 15 11/24/2017   CO2 29 11/24/2017   TSH 2.04 11/24/2017   PSA 1.46 11/24/2017   HGBA1C 6.0 11/24/2017    Lab Results  Component Value Date   TSH 2.04 11/24/2017   Lab Results  Component Value Date   WBC 8.3 11/24/2017   HGB 16.6 11/24/2017   HCT 49.5 11/24/2017   MCV 86.2 11/24/2017   PLT 286.0 11/24/2017   Lab  Results  Component Value Date   NA 139 11/24/2017   K 4.7 11/24/2017   CO2 29 11/24/2017   GLUCOSE 117 (H) 11/24/2017   BUN 15 11/24/2017   CREATININE 1.15 11/24/2017   BILITOT 0.4 11/24/2017   ALKPHOS 68 11/24/2017   AST 22 11/24/2017   ALT 29 11/24/2017   PROT 7.0 11/24/2017   ALBUMIN 4.5 11/24/2017   CALCIUM 9.4 11/24/2017   GFR 70.12 11/24/2017   Lab Results  Component Value Date   CHOL 129 11/24/2017   Lab Results  Component Value Date   HDL 35.30 (L) 11/24/2017   Lab Results  Component Value Date   LDLCALC 58 12/27/2015   Lab Results  Component Value Date   TRIG 230.0 (H) 11/24/2017   Lab Results  Component Value Date   CHOLHDL 4 11/24/2017   Lab Results  Component Value Date   HGBA1C 6.0 11/24/2017       Assessment & Plan:   Problem List Items Addressed This Visit    Hyperlipidemia, mixed    Encouraged heart healthy diet, increase exercise, avoid trans fats, consider a krill oil cap daily      Relevant Medications   sildenafil (REVATIO) 20 MG tablet   Obesity    Encouraged DASH diet, decrease po intake and increase exercise as tolerated. Needs 7-8 hours of sleep nightly. Avoid trans fats, eat small, frequent meals every 4-5 hours with lean proteins, complex carbs and healthy fats. Minimize simple carbs  Essential hypertension     no changes to meds. Encouraged heart healthy diet such as the DASH diet and exercise as tolerated. Encouraged to check vitals weekly and record. Call with any concerns.       Relevant Medications   sildenafil (REVATIO) 20 MG tablet   Hyperglycemia    hgba1c acceptable, minimize simple carbs. Increase exercise as tolerated.       ED (erectile dysfunction)    Refill given on Sildenafil      Sciatica    Encouraged moist heat and gentle stretching as tolerated. May try NSAIDs and prescription meds as directed and report if symptoms worsen or seek immediate care. Medrol dospak and Tizanidine 4 mg po bid prn       Relevant Medications   tiZANidine (ZANAFLEX) 4 MG tablet      I am having [name] start on methylPREDNISolone and tiZANidine. I am also having him maintain his fluticasone, albuterol, acetaminophen, ibuprofen, Bystolic, allopurinol, lisinopril, omeprazole, budesonide-formoterol, atorvastatin, and sildenafil.  Meds ordered this encounter  Medications  . methylPREDNISolone (MEDROL) 4 MG tablet    Sig: 5 tab po qd X 1d then 4 tab po qd X 1d then 3 tab po qd X 1d then 2 tab po qd then 1 tab po qd    Dispense:  15 tablet    Refill:  0  . tiZANidine (ZANAFLEX) 4 MG tablet    Sig: Take 0.5-1 tablets (2-4 mg total) by mouth every 8 (eight) hours as needed for muscle spasms.    Dispense:  30 tablet    Refill:  1  . sildenafil (REVATIO) 20 MG tablet    Sig: Take 1-5 tablets (20-100 mg total) by mouth daily as needed.    Dispense:  150 tablet    Refill:  2    I discussed the assessment and treatment plan with the patient. The patient was provided an opportunity to ask questions and all were answered. The patient agreed with the plan and demonstrated an understanding of the instructions.   The patient was advised to call back or seek an in-person evaluation if the symptoms worsen or if the condition fails to improve as anticipated.  I provided 25 minutes of non-face-to-face time during this encounter.   Penni Homans, MD

## 2018-05-25 NOTE — Assessment & Plan Note (Signed)
Refill given on Sildenafil

## 2018-06-09 MED FILL — ALLOPURINOL 100 MG TABS: 100 | 90 days supply | Qty: 90 | Fill #1

## 2018-06-09 MED FILL — LISINOPRIL 40 MG TABLET: 40 | 90 days supply | Qty: 90 | Fill #1

## 2018-07-21 ENCOUNTER — Other Ambulatory Visit: Payer: Self-pay | Admitting: Family Medicine

## 2018-07-21 ENCOUNTER — Encounter: Payer: Self-pay | Admitting: Family Medicine

## 2018-07-21 DIAGNOSIS — E782 Mixed hyperlipidemia: Secondary | ICD-10-CM

## 2018-07-21 MED ORDER — NEBIVOLOL HCL 10 MG PO TABS: ORAL_TABLET | ORAL | 1 refills | Status: DC

## 2018-07-21 MED ORDER — ATORVASTATIN CALCIUM 40 MG PO TABS: 40.0000 mg | ORAL_TABLET | Freq: Every day | ORAL | 1 refills | Status: DC

## 2018-07-21 MED FILL — BYSTOLIC 10 MG TABLET: 10 | 90 days supply | Qty: 90 | Fill #0

## 2018-07-21 MED FILL — ATORVASTATIN 40 MG TABLET: 40 | 90 days supply | Qty: 90 | Fill #0

## 2018-08-26 ENCOUNTER — Ambulatory Visit (INDEPENDENT_AMBULATORY_CARE_PROVIDER_SITE_OTHER): Payer: BC Managed Care – PPO | Admitting: Family Medicine

## 2018-08-26 ENCOUNTER — Encounter: Payer: Self-pay | Admitting: Family Medicine

## 2018-08-26 ENCOUNTER — Other Ambulatory Visit: Payer: Self-pay

## 2018-08-26 DIAGNOSIS — E782 Mixed hyperlipidemia: Secondary | ICD-10-CM

## 2018-08-26 DIAGNOSIS — I10 Essential (primary) hypertension: Secondary | ICD-10-CM

## 2018-08-26 DIAGNOSIS — M5432 Sciatica, left side: Secondary | ICD-10-CM | POA: Diagnosis not present

## 2018-08-26 DIAGNOSIS — R739 Hyperglycemia, unspecified: Secondary | ICD-10-CM | POA: Diagnosis not present

## 2018-08-26 MED ORDER — METHYLPREDNISOLONE 4 MG PO TABS: ORAL_TABLET | ORAL | 0 refills | Status: DC

## 2018-08-26 MED ORDER — OMEPRAZOLE 40 MG PO CPDR: 40.0000 mg | DELAYED_RELEASE_CAPSULE | Freq: Every day | ORAL | 5 refills | Status: DC

## 2018-08-26 MED FILL — METHYLPREDNISOLONE 4 MG TAB: 4 | 15 days supply | Qty: 47 | Fill #0

## 2018-08-26 MED FILL — OMEPRAZOLE 40 MG CPDR: 40 | 30 days supply | Qty: 30 | Fill #0

## 2018-08-29 NOTE — Assessment & Plan Note (Signed)
hgba1c acceptable, minimize simple carbs. Increase exercise as tolerated. Minimize carbs while on Medrol especially

## 2018-08-29 NOTE — Assessment & Plan Note (Signed)
Encouraged heart healthy diet, increase exercise, avoid trans fats, consider a krill oil cap daily 

## 2018-08-29 NOTE — Progress Notes (Signed)
Virtual Visit via Video Note  I connected with [name] on 08/26/18 at  3:40 PM EDT by a video enabled telemedicine application and verified that I am speaking with the correct person using two identifiers.  Location: Patient: home Provider: office   I discussed the limitations of evaluation and management by telemedicine and the availability of in person appointments. The patient expressed understanding and agreed to proceed. Magdalene Molly, CMA was able to get the patient set up on visit, video   Subjective:    Patient ID: Barry Anthony, male    DOB: 10/28/1960, 59 y.o.   MRN: [id number]  Chief Complaint  Patient presents with  . Hyperlipidemia  . Hypertension    HPI Patient is in today for follow-up on chronic medical conditions including hypertension, hyperlipidemia and obesity.  His only major complaint today is a return of his right hip pain with radicular pain down his right leg intermittently.  In the past of course of a Medrol Dosepak was very helpful.  No incontinence.  No injury or fall.  No recent febrile illness or hospitalization.  Denies polyuria or polydipsia. Denies CP/palp/SOB/HA/congestion/fevers/GI or GU c/o. Taking meds as prescribed  Past Medical History:  Diagnosis Date  . Allergic rhinitis   . Allergy   . Bilateral hydrocele 12/08/2012  . GERD (gastroesophageal reflux disease)   . History of chicken pox   . Hyperglycemia 08/15/2008   Qualifier: Diagnosis of  By: Wynona Luna    . Hyperlipidemia   . Hypertension   . Lipoma of shoulder   . Preventative health care 02/12/2014  . Testicular pain, left 08/04/2016    Past Surgical History:  Procedure Laterality Date  . EYE SURGERY    . HERNIA REPAIR     groin  . KNEE SURGERY  1981   right knee  . TONSILLECTOMY  1969  . WISDOM TOOTH EXTRACTION      Family History  Problem Relation Age of Onset  . Thyroid cancer Father   . Hypertension Father   . Coronary artery disease Father   .  Hyperlipidemia Father   . Alcohol abuse Father        smoker  . Heart disease Father   . Hypothyroidism Mother   . Dementia Maternal Aunt   . Dementia Maternal Uncle   . Cancer Maternal Grandmother        breast  . Heart disease Paternal Grandmother        heart disease  . Other Unknown        no colon cancer, no prostate canceer  . Colon cancer Neg Hx     Social History   Socioeconomic History  . Marital status: Divorced    Spouse name: Not on file  . Number of children: Not on file  . Years of education: Not on file  . Highest education level: Not on file  Occupational History  . Not on file  Social Needs  . Financial resource strain: Not on file  . Food insecurity    Worry: Not on file    Inability: Not on file  . Transportation needs    Medical: Not on file    Non-medical: Not on file  Tobacco Use  . Smoking status: Never Smoker  . Smokeless tobacco: Never Used  Substance and Sexual Activity  . Alcohol use: Yes    Comment: 2 drinks / week  . Drug use: No  . Sexual activity: Yes    Comment:  lives with wife, works as a Public house manager at [employer]. no major dietary restrictions  Lifestyle  . Physical activity    Days per week: Not on file    Minutes per session: Not on file  . Stress: Not on file  Relationships  . Social Herbalist on phone: Not on file    Gets together: Not on file    Attends religious service: Not on file    Active member of club or organization: Not on file    Attends meetings of clubs or organizations: Not on file    Relationship status: Not on file  . Intimate partner violence    Fear of current or ex partner: Not on file    Emotionally abused: Not on file    Physically abused: Not on file    Forced sexual activity: Not on file  Other Topics Concern  . Not on file  Social History Narrative   Occupation:  Parts mgr   Married 16 yrs    2 children  10, 12    Never Smoked    Alcohol use-yes (social)      Outpatient Medications Prior to Visit  Medication Sig Dispense Refill  . acetaminophen (TYLENOL) 500 MG tablet Take 1-2 tablets (500-1,000 mg total) by mouth every 8 (eight) hours as needed for moderate pain. 100 tablet 2  . albuterol (PROAIR HFA) 108 (90 Base) MCG/ACT inhaler INHALE 2 PUFFS INTO THE LUNGS DAILY AS NEEDED 8.5 g 3  . allopurinol (ZYLOPRIM) 100 MG tablet TAKE 1 TABLET (100 MG TOTAL) BY MOUTH DAILY. 90 tablet 3  . atorvastatin (LIPITOR) 40 MG tablet Take 1 tablet (40 mg total) by mouth daily. 90 tablet 1  . budesonide-formoterol (SYMBICORT) 80-4.5 MCG/ACT inhaler Inhale 2 puffs into the lungs 2 (two) times daily. 1 Inhaler 3  . fluticasone (FLONASE) 50 MCG/ACT nasal spray Place 2 sprays into both nostrils daily as needed. 48 g 3  . ibuprofen (ADVIL) 200 MG tablet Take 1-2 tablets (200-400 mg total) by mouth every 8 (eight) hours as needed for moderate pain. 100 tablet 2  . lisinopril (PRINIVIL,ZESTRIL) 40 MG tablet TAKE 1 TABLET (40 MG TOTAL) BY MOUTH DAILY. 90 tablet 3  . nebivolol (BYSTOLIC) 10 MG tablet TAKE 1 TABLET (10 MG TOTAL) BY MOUTH DAILY. 90 tablet 1  . sildenafil (REVATIO) 20 MG tablet Take 1-5 tablets (20-100 mg total) by mouth daily as needed. 150 tablet 2  . tiZANidine (ZANAFLEX) 4 MG tablet Take 0.5-1 tablets (2-4 mg total) by mouth every 8 (eight) hours as needed for muscle spasms. 30 tablet 1  . methylPREDNISolone (MEDROL) 4 MG tablet 5 tab po qd X 1d then 4 tab po qd X 1d then 3 tab po qd X 1d then 2 tab po qd then 1 tab po qd 15 tablet 0  . omeprazole (PRILOSEC) 40 MG capsule Take 1 capsule (40 mg total) by mouth daily. 30 capsule 3   No facility-administered medications prior to visit.     No Known Allergies  Review of Systems  Constitutional: Negative for fever and malaise/fatigue.  HENT: Negative for congestion.   Eyes: Negative for blurred vision.  Respiratory: Negative for shortness of breath.   Cardiovascular: Negative for chest pain, palpitations  and leg swelling.  Gastrointestinal: Negative for abdominal pain, blood in stool and nausea.  Genitourinary: Negative for dysuria and frequency.  Musculoskeletal: Positive for joint pain. Negative for falls.  Skin: Negative for rash.  Neurological: Negative  for dizziness, loss of consciousness and headaches.  Endo/Heme/Allergies: Negative for environmental allergies.  Psychiatric/Behavioral: Negative for depression. The patient is not nervous/anxious.        Objective:    Physical Exam Constitutional:      Appearance: Normal appearance. He is obese. He is not ill-appearing.  HENT:     Head: Normocephalic and atraumatic.     Nose: Nose normal.  Eyes:     General:        Right eye: No discharge.        Left eye: No discharge.  Pulmonary:     Effort: Pulmonary effort is normal.  Neurological:     Mental Status: He is alert and oriented to person, place, and time.  Psychiatric:        Mood and Affect: Mood normal.        Behavior: Behavior normal.     There were no vitals taken for this visit. Wt Readings from Last 3 Encounters:  03/09/18 (!) 307 lb (139.3 kg)  02/19/18 (!) 308 lb 9.6 oz (140 kg)  12/01/17 (!) 305 lb 12.8 oz (138.7 kg)    Diabetic Foot Exam - Simple   No data filed     Lab Results  Component Value Date   WBC 8.3 11/24/2017   HGB 16.6 11/24/2017   HCT 49.5 11/24/2017   PLT 286.0 11/24/2017   GLUCOSE 117 (H) 11/24/2017   CHOL 129 11/24/2017   TRIG 230.0 (H) 11/24/2017   HDL 35.30 (L) 11/24/2017   LDLDIRECT 77.0 11/24/2017   LDLCALC 58 12/27/2015   ALT 29 11/24/2017   AST 22 11/24/2017   NA 139 11/24/2017   K 4.7 11/24/2017   CL 101 11/24/2017   CREATININE 1.15 11/24/2017   BUN 15 11/24/2017   CO2 29 11/24/2017   TSH 2.04 11/24/2017   PSA 1.46 11/24/2017   HGBA1C 6.0 11/24/2017    Lab Results  Component Value Date   TSH 2.04 11/24/2017   Lab Results  Component Value Date   WBC 8.3 11/24/2017   HGB 16.6 11/24/2017   HCT 49.5  11/24/2017   MCV 86.2 11/24/2017   PLT 286.0 11/24/2017   Lab Results  Component Value Date   NA 139 11/24/2017   K 4.7 11/24/2017   CO2 29 11/24/2017   GLUCOSE 117 (H) 11/24/2017   BUN 15 11/24/2017   CREATININE 1.15 11/24/2017   BILITOT 0.4 11/24/2017   ALKPHOS 68 11/24/2017   AST 22 11/24/2017   ALT 29 11/24/2017   PROT 7.0 11/24/2017   ALBUMIN 4.5 11/24/2017   CALCIUM 9.4 11/24/2017   GFR 70.12 11/24/2017   Lab Results  Component Value Date   CHOL 129 11/24/2017   Lab Results  Component Value Date   HDL 35.30 (L) 11/24/2017   Lab Results  Component Value Date   LDLCALC 58 12/27/2015   Lab Results  Component Value Date   TRIG 230.0 (H) 11/24/2017   Lab Results  Component Value Date   CHOLHDL 4 11/24/2017   Lab Results  Component Value Date   HGBA1C 6.0 11/24/2017       Assessment & Plan:   Problem List Items Addressed This Visit    Hyperlipidemia, mixed    Encouraged heart healthy diet, increase exercise, avoid trans fats, consider a krill oil cap daily      Essential hypertension    Monitor vitals weekly. no changes to meds. Encouraged heart healthy diet such as the DASH diet and exercise  as tolerated.       Hyperglycemia    hgba1c acceptable, minimize simple carbs. Increase exercise as tolerated. Minimize carbs while on Medrol especially      Sciatica    Right hip pain with radiculopathy down right leg. Has responded to Medrol in the past. Encouraged moist heat and gentle stretching as tolerated. May try NSAIDs and prescription meds as directed and report if symptoms worsen or seek immediate care. Given refill on Medrol. Consider referral to sports medicine if symptoms persist         I have changed Venice E. Scarpelli's methylPREDNISolone. I am also having him maintain his fluticasone, albuterol, acetaminophen, ibuprofen, allopurinol, lisinopril, budesonide-formoterol, tiZANidine, sildenafil, atorvastatin, nebivolol, and omeprazole.  Meds  ordered this encounter  Medications  . methylPREDNISolone (MEDROL) 4 MG tablet    Sig: 5 tab po qd X 3d then 4 tab po qd X 3d then 3 tab po qd X 3d then 2 tab po qd  X 3 d then 1 tab po qd x 3 d then 1/2 tab po qd x 3d    Dispense:  47 tablet    Refill:  0  . omeprazole (PRILOSEC) 40 MG capsule    Sig: Take 1 capsule (40 mg total) by mouth daily.    Dispense:  30 capsule    Refill:  5      I discussed the assessment and treatment plan with the patient. The patient was provided an opportunity to ask questions and all were answered. The patient agreed with the plan and demonstrated an understanding of the instructions.   The patient was advised to call back or seek an in-person evaluation if the symptoms worsen or if the condition fails to improve as anticipated.  I provided 25 minutes of non-face-to-face time during this encounter.   Penni Homans, MD

## 2018-08-29 NOTE — Assessment & Plan Note (Signed)
Right hip pain with radiculopathy down right leg. Has responded to Medrol in the past. Encouraged moist heat and gentle stretching as tolerated. May try NSAIDs and prescription meds as directed and report if symptoms worsen or seek immediate care. Given refill on Medrol. Consider referral to sports medicine if symptoms persist

## 2018-08-29 NOTE — Assessment & Plan Note (Signed)
Monitor vitals weekly. no changes to meds. Encouraged heart healthy diet such as the DASH diet and exercise as tolerated.  

## 2018-09-14 ENCOUNTER — Encounter: Payer: Self-pay | Admitting: Family Medicine

## 2018-09-16 ENCOUNTER — Other Ambulatory Visit: Payer: Self-pay | Admitting: Family Medicine

## 2018-09-16 DIAGNOSIS — I10 Essential (primary) hypertension: Secondary | ICD-10-CM

## 2018-09-16 DIAGNOSIS — R739 Hyperglycemia, unspecified: Secondary | ICD-10-CM

## 2018-09-16 DIAGNOSIS — E782 Mixed hyperlipidemia: Secondary | ICD-10-CM

## 2018-09-16 DIAGNOSIS — M1000 Idiopathic gout, unspecified site: Secondary | ICD-10-CM

## 2018-09-17 NOTE — Telephone Encounter (Signed)
I ordered his future labs but he needs a lab appt and an appt for a nurse visit to get his flu shot the same day

## 2018-09-23 MED FILL — ALLOPURINOL 100 MG TABS: 100 | 90 days supply | Qty: 90 | Fill #2

## 2018-09-23 MED FILL — LISINOPRIL 40 MG TABLET: 40 | 90 days supply | Qty: 90 | Fill #2

## 2018-09-28 ENCOUNTER — Other Ambulatory Visit (INDEPENDENT_AMBULATORY_CARE_PROVIDER_SITE_OTHER): Payer: BC Managed Care – PPO

## 2018-09-28 ENCOUNTER — Ambulatory Visit (INDEPENDENT_AMBULATORY_CARE_PROVIDER_SITE_OTHER): Payer: BC Managed Care – PPO

## 2018-09-28 ENCOUNTER — Other Ambulatory Visit: Payer: Self-pay

## 2018-09-28 DIAGNOSIS — Z23 Encounter for immunization: Secondary | ICD-10-CM | POA: Diagnosis not present

## 2018-09-28 DIAGNOSIS — E782 Mixed hyperlipidemia: Secondary | ICD-10-CM

## 2018-09-28 DIAGNOSIS — R739 Hyperglycemia, unspecified: Secondary | ICD-10-CM

## 2018-09-28 DIAGNOSIS — M1000 Idiopathic gout, unspecified site: Secondary | ICD-10-CM

## 2018-09-28 DIAGNOSIS — I10 Essential (primary) hypertension: Secondary | ICD-10-CM | POA: Diagnosis not present

## 2018-09-28 LAB — LIPID PANEL
Cholesterol: 135 mg/dL (ref 0–200)
HDL: 33.6 mg/dL — ABNORMAL LOW (ref >39.00)
NonHDL: 101.58
Total CHOL/HDL Ratio: 4
Triglycerides: 308 mg/dL — ABNORMAL HIGH (ref 0.0–149.0)
VLDL: 61.6 mg/dL — ABNORMAL HIGH (ref 0.0–40.0)

## 2018-09-28 LAB — CBC
HCT: 44.2 % (ref 39.0–52.0)
Hemoglobin: 15 g/dL (ref 13.0–17.0)
MCHC: 34 g/dL (ref 30.0–36.0)
MCV: 87.9 fl (ref 78.0–100.0)
Platelets: 246 10*3/uL (ref 150.0–400.0)
RBC: 5.03 Mil/uL (ref 4.22–5.81)
RDW: 13.8 % (ref 11.5–15.5)
WBC: 7 10*3/uL (ref 4.0–10.5)

## 2018-09-28 LAB — TSH: TSH: 1.68 u[IU]/mL (ref 0.35–4.50)

## 2018-09-28 LAB — COMPREHENSIVE METABOLIC PANEL
ALT: 32 U/L (ref 0–53)
AST: 28 U/L (ref 0–37)
Albumin: 4 g/dL (ref 3.5–5.2)
Alkaline Phosphatase: 58 U/L (ref 39–117)
BUN: 12 mg/dL (ref 6–23)
CO2: 29 mEq/L (ref 19–32)
Calcium: 9.1 mg/dL (ref 8.4–10.5)
Chloride: 104 mEq/L (ref 96–112)
Creatinine, Ser: 1.05 mg/dL (ref 0.40–1.50)
GFR: 73.05 mL/min (ref >60.00)
Glucose, Bld: 117 mg/dL — ABNORMAL HIGH (ref 70–99)
Potassium: 4.3 mEq/L (ref 3.5–5.1)
Sodium: 140 mEq/L (ref 135–145)
Total Bilirubin: 0.7 mg/dL (ref 0.2–1.2)
Total Protein: 6.1 g/dL (ref 6.0–8.3)

## 2018-09-28 LAB — HEMOGLOBIN A1C: Hgb A1c MFr Bld: 6.3 % (ref 4.6–6.5)

## 2018-09-28 LAB — LDL CHOLESTEROL, DIRECT: Direct LDL: 77 mg/dL

## 2018-09-28 LAB — URIC ACID: Uric Acid, Serum: 6.8 mg/dL (ref 4.0–7.8)

## 2018-10-04 DIAGNOSIS — I10 Essential (primary) hypertension: Secondary | ICD-10-CM

## 2018-10-04 DIAGNOSIS — E782 Mixed hyperlipidemia: Secondary | ICD-10-CM

## 2018-10-04 MED ORDER — ATORVASTATIN CALCIUM 80 MG PO TABS: 80.0000 mg | ORAL_TABLET | Freq: Every day | ORAL | 3 refills | Status: DC

## 2018-10-09 ENCOUNTER — Encounter: Payer: Self-pay | Admitting: Family Medicine

## 2018-10-11 ENCOUNTER — Other Ambulatory Visit: Payer: Self-pay | Admitting: Family Medicine

## 2018-10-11 DIAGNOSIS — M545 Low back pain, unspecified: Secondary | ICD-10-CM

## 2018-11-03 MED FILL — BYSTOLIC 10 MG TABLET: 10 | 90 days supply | Qty: 90 | Fill #1

## 2018-11-22 MED FILL — FAMOTIDINE 40 MG TABLET: 40 | 90 days supply | Qty: 90 | Fill #1

## 2018-11-22 MED FILL — OMEPRAZOLE 40 MG CPDR: 40 | 30 days supply | Qty: 30 | Fill #1

## 2018-11-22 MED FILL — SYMBICORT 80-4.5 MCG INH: 80-4.5 | 30 days supply | Qty: 10 | Fill #2

## 2018-11-22 MED FILL — VENTOLIN HFA 90 MCG INHALER: 108 (90 BAS | 90 days supply | Qty: 18 | Fill #2

## 2018-11-30 ENCOUNTER — Ambulatory Visit: Payer: BC Managed Care – PPO | Admitting: Family Medicine

## 2018-12-08 NOTE — Progress Notes (Signed)
[facility] [address], [place] [address] Phone: [phone] Subjective:   Barry Anthony, am serving as a scribe for Dr. Hulan Saas.   This visit occurred during the SARS-CoV-2 public health emergency.  Safety protocols were in place, including screening questions prior to the visit, additional usage of staff PPE, and extensive cleaning of exam room while observing appropriate contact time as indicated for disinfecting solutions.     CC: Low back pain  [id number]   10/20/2013 Patient is doing remarkably well after the injection. Patient has full range of motion with some mild pain with cross over this likely secondary to the acromioclavicular joint. Patient is if he has any worsening symptoms he'll come back and see me again and can consider doing an injection in the a.c. joint.  Does have enlargement on the right side that likely secondary to separation [place] but is Anthony longer tender. I do not see any differences and growing in size at this time. Patient is that he'll return if anything does change dramatically.  I do believe the patient does have some cervical radiculopathy that is actually giving him some of his discomfort. We will continue to monitor closely. Encourage him to continue to work on the postural exercises as well as the home exercises. We also discussed the icing and the over-the-counter medicines I think will be beneficial. The patient continues to do relatively well we can follow up on an as-needed basis. Patient will come in for any type of exacerbation. Patient encouraged to continue to try to titrate down on any medications of possible a refill for meloxicam was given today.  Update 12/09/2018 Barry Anthony is a 58 y.o. male coming in with complaint of back pain. Pain over the SI joint and has been having sciatic nerve pain in both legs. Lumbar flexion with lifting increases his pain. Pain began in August. Patient was on  prednisone twice.     Past Medical History:  Diagnosis Date   Allergic rhinitis    Allergy    Bilateral hydrocele 12/08/2012   GERD (gastroesophageal reflux disease)    History of chicken pox    Hyperglycemia 08/15/2008   Qualifier: Diagnosis of  By: Wynona Luna     Hyperlipidemia    Hypertension    Lipoma of shoulder    Preventative health care 02/12/2014   Testicular pain, left 08/04/2016   Past Surgical History:  Procedure Laterality Date   EYE SURGERY     HERNIA REPAIR     groin   KNEE SURGERY  1981   right knee   TONSILLECTOMY  1969   WISDOM TOOTH EXTRACTION     Social History   Socioeconomic History   Marital status: Divorced    Spouse name: Not on file   Number of children: Not on file   Years of education: Not on file   Highest education level: Not on file  Occupational History   Not on file  Social Needs   Financial resource strain: Not on file   Food insecurity    Worry: Not on file    Inability: Not on file   Transportation needs    Medical: Not on file    Non-medical: Not on file  Tobacco Use   Smoking status: Never Smoker   Smokeless tobacco: Never Used  Substance and Sexual Activity   Alcohol use: Yes    Comment: 2 drinks / week   Drug use: Anthony  Sexual activity: Yes    Comment: lives with wife, works as a Public house manager at [employer]. Anthony major dietary restrictions  Lifestyle   Physical activity    Days per week: Not on file    Minutes per session: Not on file   Stress: Not on file  Relationships   Social connections    Talks on phone: Not on file    Gets together: Not on file    Attends religious service: Not on file    Active member of club or organization: Not on file    Attends meetings of clubs or organizations: Not on file    Relationship status: Not on file  Other Topics Concern   Not on file  Social History Narrative   Occupation:  Parts mgr   Married 16 yrs    2 children  40, 12      Never Smoked    Alcohol use-yes (social)    Anthony Known Allergies Family History  Problem Relation Age of Onset   Thyroid cancer Father    Hypertension Father    Coronary artery disease Father    Hyperlipidemia Father    Alcohol abuse Father        smoker   Heart disease Father    Hypothyroidism Mother    Dementia Maternal Aunt    Dementia Maternal Uncle    Cancer Maternal Grandmother        breast   Heart disease Paternal Grandmother        heart disease   Other Unknown        Anthony colon cancer, Anthony prostate canceer   Colon cancer Neg Hx     Current Outpatient Medications (Endocrine & Metabolic):    methylPREDNISolone (MEDROL) 4 MG tablet, 5 tab po qd X 3d then 4 tab po qd X 3d then 3 tab po qd X 3d then 2 tab po qd  X 3 d then 1 tab po qd x 3 d then 1/2 tab po qd x 3d  Current Outpatient Medications (Cardiovascular):    atorvastatin (LIPITOR) 80 MG tablet, Take 1 tablet (80 mg total) by mouth daily.   lisinopril (PRINIVIL,ZESTRIL) 40 MG tablet, TAKE 1 TABLET (40 MG TOTAL) BY MOUTH DAILY.   nebivolol (BYSTOLIC) 10 MG tablet, TAKE 1 TABLET (10 MG TOTAL) BY MOUTH DAILY.   sildenafil (REVATIO) 20 MG tablet, Take 1-5 tablets (20-100 mg total) by mouth daily as needed.  Current Outpatient Medications (Respiratory):    albuterol (PROAIR HFA) 108 (90 Base) MCG/ACT inhaler, INHALE 2 PUFFS INTO THE LUNGS DAILY AS NEEDED   budesonide-formoterol (SYMBICORT) 80-4.5 MCG/ACT inhaler, Inhale 2 puffs into the lungs 2 (two) times daily.   fluticasone (FLONASE) 50 MCG/ACT nasal spray, Place 2 sprays into both nostrils daily as needed.  Current Outpatient Medications (Analgesics):    acetaminophen (TYLENOL) 500 MG tablet, Take 1-2 tablets (500-1,000 mg total) by mouth every 8 (eight) hours as needed for moderate pain.   allopurinol (ZYLOPRIM) 100 MG tablet, TAKE 1 TABLET (100 MG TOTAL) BY MOUTH DAILY.   ibuprofen (ADVIL) 200 MG tablet, Take 1-2 tablets (200-400 mg total)  by mouth every 8 (eight) hours as needed for moderate pain.   Current Outpatient Medications (Other):    omeprazole (PRILOSEC) 40 MG capsule, Take 1 capsule (40 mg total) by mouth daily.   tiZANidine (ZANAFLEX) 4 MG tablet, Take 0.5-1 tablets (2-4 mg total) by mouth every 8 (eight) hours as needed for muscle spasms.   gabapentin (NEURONTIN)  100 MG capsule, Take 2 capsules (200 mg total) by mouth at bedtime.    Past medical history, social, surgical and family history all reviewed in electronic medical record.  Anthony pertanent information unless stated regarding to the chief complaint.   Review of Systems:  Anthony headache, visual changes, nausea, vomiting, diarrhea, constipation, dizziness, abdominal pain, skin rash, fevers, chills, night sweats, weight loss, swollen lymph nodes, body aches, joint swelling, chest pain, shortness of breath, mood changes.  Positive muscle aches  Objective  Blood pressure 120/82, pulse 86, height 5\' 10"  (1.778 m), weight (!) 320 lb (145.2 kg), SpO2 97 %.    General: Anthony apparent distress alert and oriented x3 mood and affect normal, dressed appropriately.  Morbidly obese HEENT: Pupils equal, extraocular movements intact  Respiratory: Patient's speak in full sentences and does not appear short of breath  Cardiovascular: Anthony lower extremity edema, non tender, Anthony erythema  Skin: Warm dry intact with Anthony signs of infection or rash on extremities or on axial skeleton.  Abdomen: Soft nontender  Neuro: Cranial nerves II through XII are intact, neurovascularly intact in all extremities with 2+ DTRs and 2+ pulses.  Lymph: Anthony lymphadenopathy of posterior or anterior cervical chain or axillae bilaterally.  Gait normal with good balance and coordination.  MSK:  tender with full range of motion and good stability and symmetric strength and tone of shoulders, elbows, wrist, hip, knee and ankles bilaterally.  Back Exam:  Inspection: Loss of lordosis Motion: Flexion 45 deg,  Extension 25 deg, Side Bending to 35 deg bilaterally,  Rotation to 45 deg bilaterally  SLR laying: Negative  XSLR laying: Negative  Palpable tenderness: Tender to palpation in paraspinal musculature lumbar spine right greater than left. FABER: Tightness on the right. Sensory change: Gross sensation intact to all lumbar and sacral dermatomes.  Reflexes: 2+ at both patellar tendons, 2+ at achilles tendons, Babinski's downgoing.  Strength at foot  Plantar-flexion: 5/5 Dorsi-flexion: 5/5 Eversion: 5/5 Inversion: 5/5  Leg strength  Quad: 5/5 Hamstring: 5/5 Hip flexor: 5/5 Hip abductors: 5/5    [id number]; 15 additional minutes spent for Therapeutic exercises as stated in above notes.  This included exercises focusing on stretching, strengthening, with significant focus on eccentric aspects.   Long term goals include an improvement in range of motion, strength, endurance as well as avoiding reinjury. Patient's frequency would include in 1-2 times a day, 3-5 times a week for a duration of 6-12 weeks. Low back exercises that included:  Pelvic tilt/bracing instruction to focus on control of the pelvic girdle and lower abdominal muscles  Glute strengthening exercises, focusing on proper firing of the glutes without engaging the low back muscles Proper stretching techniques for maximum relief for the hamstrings, hip flexors, low back and some rotation where tolerated   Proper technique shown and discussed handout in great detail with ATC.  All questions were discussed and answered.     Impression and Recommendations:     This case required medical decision making of moderate complexity. The above documentation has been reviewed and is accurate and complete Lyndal Pulley, DO       Note: This dictation was prepared with Dragon dictation along with smaller phrase technology. Any transcriptional errors that result from this process are unintentional.

## 2018-12-09 ENCOUNTER — Other Ambulatory Visit: Payer: Self-pay

## 2018-12-09 ENCOUNTER — Encounter: Payer: Self-pay | Admitting: Family Medicine

## 2018-12-09 ENCOUNTER — Ambulatory Visit (INDEPENDENT_AMBULATORY_CARE_PROVIDER_SITE_OTHER): Payer: BC Managed Care – PPO | Admitting: Family Medicine

## 2018-12-09 ENCOUNTER — Ambulatory Visit (INDEPENDENT_AMBULATORY_CARE_PROVIDER_SITE_OTHER)
Admission: RE | Admit: 2018-12-09 | Discharge: 2018-12-09 | Disposition: A | Discharge status: Home / Self Care | Payer: BC Managed Care – PPO | Source: Ambulatory Visit | Attending: Family Medicine | Admitting: Family Medicine

## 2018-12-09 VITALS — BP 120/82 | HR 86 | Ht 70.0 in | Wt 320.0 lb

## 2018-12-09 DIAGNOSIS — M545 Low back pain, unspecified: Secondary | ICD-10-CM

## 2018-12-09 DIAGNOSIS — M48061 Spinal stenosis, lumbar region without neurogenic claudication: Secondary | ICD-10-CM | POA: Diagnosis not present

## 2018-12-09 DIAGNOSIS — M4186 Other forms of scoliosis, lumbar region: Secondary | ICD-10-CM | POA: Diagnosis not present

## 2018-12-09 DIAGNOSIS — M47816 Spondylosis without myelopathy or radiculopathy, lumbar region: Secondary | ICD-10-CM | POA: Diagnosis not present

## 2018-12-09 MED ORDER — GABAPENTIN 100 MG PO CAPS: 200.0000 mg | ORAL_CAPSULE | Freq: Every day | ORAL | 0 refills | Status: DC

## 2018-12-09 MED FILL — GABAPENTIN 100 MG CAPSULE: 100 | 90 days supply | Qty: 180 | Fill #0

## 2018-12-09 NOTE — Patient Instructions (Addendum)
  Good to see you.  Ice 20 minutes 2 times daily. Usually after activity and before bed. Exercises 3 times a week.  Xray downstairs Gabapentin 200 mg at night   Turmeric 500mg  daily  Tart cherry extract 1200mg  at night Vitamin D 2000 IU daily  See me again in 4 weeks     [address]st [address] [place], [place] [address] Phone [phone]

## 2018-12-09 NOTE — Assessment & Plan Note (Signed)
Spinal stenosis.  Discussed posture and ergonomics, discussed which activities do which wants to avoid.  Gabapentin prescribed today, discussed over-the-counter medications, x-rays pending to further evaluate.  Patient's radicular symptoms has improved somewhat.  Follow-up with me again in 4 to 8 weeks

## 2019-01-05 ENCOUNTER — Other Ambulatory Visit: Payer: Self-pay

## 2019-01-05 ENCOUNTER — Encounter: Payer: Self-pay | Admitting: Family Medicine

## 2019-01-05 ENCOUNTER — Ambulatory Visit: Payer: BC Managed Care – PPO | Admitting: Family Medicine

## 2019-01-05 DIAGNOSIS — M48061 Spinal stenosis, lumbar region without neurogenic claudication: Secondary | ICD-10-CM

## 2019-01-05 MED FILL — LISINOPRIL 40 MG TABLET: 40 | 90 days supply | Qty: 90 | Fill #3

## 2019-01-05 MED FILL — OMEPRAZOLE 40 MG CPDR: 40 | 30 days supply | Qty: 30 | Fill #2

## 2019-01-05 MED FILL — ALLOPURINOL 100 MG TABS: 100 | 90 days supply | Qty: 90 | Fill #3

## 2019-01-05 NOTE — Progress Notes (Signed)
Barry Anthony Phone: [phone] Subjective:   Barry Anthony, am serving as a scribe for Dr. Hulan Saas. This visit occurred during the SARS-CoV-2 public health emergency.  Safety protocols were in place, including screening questions prior to the visit, additional usage of staff PPE, and extensive cleaning of exam room while observing appropriate contact time as indicated for disinfecting solutions.   I'm seeing this patient by the request  of:    CC: Low back pain follow-up  [id number]   12/09/2018 Spinal stenosis.  Discussed posture and ergonomics, discussed which activities do which wants to avoid.  Gabapentin prescribed today, discussed over-the-counter medications, x-rays pending to further evaluate.  Patient's radicular symptoms has improved somewhat.  Follow-up with me again in 4 to 8 weeks  Update 01/05/2019 Barry Anthony is a 58 y.o. male coming in with complaint of back pain. Patient states about 80% better.  Discussed which activities she has been doing.  Patient has been working out a little more regularly.  Patient states that at the end of the day some mild discomfort.  Anthony longer taking any ibuprofen.    Past Medical History:  Diagnosis Date  . Allergic rhinitis   . Allergy   . Bilateral hydrocele 12/08/2012  . GERD (gastroesophageal reflux disease)   . History of chicken pox   . Hyperglycemia 08/15/2008   Qualifier: Diagnosis of  By: Wynona Luna    . Hyperlipidemia   . Hypertension   . Lipoma of shoulder   . Preventative health care 02/12/2014  . Testicular pain, left 08/04/2016   Past Surgical History:  Procedure Laterality Date  . EYE SURGERY    . HERNIA REPAIR     groin  . KNEE SURGERY  1981   right knee  . TONSILLECTOMY  1969  . WISDOM TOOTH EXTRACTION     Social History   Socioeconomic History  . Marital status: Divorced    Spouse name: Not on file  . Number of  children: Not on file  . Years of education: Not on file  . Highest education level: Not on file  Occupational History  . Not on file  Tobacco Use  . Smoking status: Never Smoker  . Smokeless tobacco: Never Used  Substance and Sexual Activity  . Alcohol use: Yes    Comment: 2 drinks / week  . Drug use: Anthony  . Sexual activity: Yes    Comment: lives with wife, works as a Public house manager at [employer]. Anthony major dietary restrictions  Other Topics Concern  . Not on file  Social History Narrative   Occupation:  Parts mgr   Married 16 yrs    2 children  10, 12    Never Smoked    Alcohol use-yes (social)    Social Determinants of Health   Financial Resource Strain:   . Difficulty of Paying Living Expenses: Not on file  Food Insecurity:   . Worried About Charity fundraiser in the Last Year: Not on file  . Ran Out of Food in the Last Year: Not on file  Transportation Needs:   . Lack of Transportation (Medical): Not on file  . Lack of Transportation (Non-Medical): Not on file  Physical Activity:   . Days of Exercise per Week: Not on file  . Minutes of Exercise per Session: Not on file  Stress:   . Feeling of Stress : Not on file  Social Connections:   . Frequency of Communication with Friends and Family: Not on file  . Frequency of Social Gatherings with Friends and Family: Not on file  . Attends Religious Services: Not on file  . Active Member of Clubs or Organizations: Not on file  . Attends Archivist Meetings: Not on file  . Marital Status: Not on file   Anthony Known Allergies Family History  Problem Relation Age of Onset  . Thyroid cancer Father   . Hypertension Father   . Coronary artery disease Father   . Hyperlipidemia Father   . Alcohol abuse Father        smoker  . Heart disease Father   . Hypothyroidism Mother   . Dementia Maternal Aunt   . Dementia Maternal Uncle   . Cancer Maternal Grandmother        breast  . Heart disease Paternal  Grandmother        heart disease  . Other Unknown        Anthony colon cancer, Anthony prostate canceer  . Colon cancer Neg Hx     Current Outpatient Medications (Endocrine & Metabolic):  .  methylPREDNISolone (MEDROL) 4 MG tablet, 5 tab po qd X 3d then 4 tab po qd X 3d then 3 tab po qd X 3d then 2 tab po qd  X 3 d then 1 tab po qd x 3 d then 1/2 tab po qd x 3d  Current Outpatient Medications (Cardiovascular):  .  atorvastatin (LIPITOR) 80 MG tablet, Take 1 tablet (80 mg total) by mouth daily. Marland Kitchen  lisinopril (PRINIVIL,ZESTRIL) 40 MG tablet, TAKE 1 TABLET (40 MG TOTAL) BY MOUTH DAILY. .  nebivolol (BYSTOLIC) 10 MG tablet, TAKE 1 TABLET (10 MG TOTAL) BY MOUTH DAILY. .  sildenafil (REVATIO) 20 MG tablet, Take 1-5 tablets (20-100 mg total) by mouth daily as needed.  Current Outpatient Medications (Respiratory):  .  albuterol (PROAIR HFA) 108 (90 Base) MCG/ACT inhaler, INHALE 2 PUFFS INTO THE LUNGS DAILY AS NEEDED .  budesonide-formoterol (SYMBICORT) 80-4.5 MCG/ACT inhaler, Inhale 2 puffs into the lungs 2 (two) times daily. .  fluticasone (FLONASE) 50 MCG/ACT nasal spray, Place 2 sprays into both nostrils daily as needed.  Current Outpatient Medications (Analgesics):  .  acetaminophen (TYLENOL) 500 MG tablet, Take 1-2 tablets (500-1,000 mg total) by mouth every 8 (eight) hours as needed for moderate pain. Marland Kitchen  allopurinol (ZYLOPRIM) 100 MG tablet, TAKE 1 TABLET (100 MG TOTAL) BY MOUTH DAILY. Marland Kitchen  ibuprofen (ADVIL) 200 MG tablet, Take 1-2 tablets (200-400 mg total) by mouth every 8 (eight) hours as needed for moderate pain.   Current Outpatient Medications (Other):  .  gabapentin (NEURONTIN) 100 MG capsule, Take 2 capsules (200 mg total) by mouth at bedtime. Marland Kitchen  omeprazole (PRILOSEC) 40 MG capsule, Take 1 capsule (40 mg total) by mouth daily. Marland Kitchen  tiZANidine (ZANAFLEX) 4 MG tablet, Take 0.5-1 tablets (2-4 mg total) by mouth every 8 (eight) hours as needed for muscle spasms.    Past medical history, social,  surgical and family history all reviewed in electronic medical record.  Anthony pertanent information unless stated regarding to the chief complaint.   Review of Systems:  Anthony headache, visual changes, nausea, vomiting, diarrhea, constipation, dizziness, abdominal pain, skin rash, fevers, chills, night sweats, weight loss, swollen lymph nodes, body aches, joint swelling,  chest pain, shortness of breath, mood changes.  Mild positive muscle aches  Objective  Blood pressure 132/78, pulse 66, height 5\' 10"  (  1.778 m), weight (!) 320 lb (145.2 kg), SpO2 96 %.    General: Anthony apparent distress alert and oriented x3 mood and affect normal, dressed appropriately.  HEENT: Pupils equal, extraocular movements intact  Respiratory: Patient's speak in full sentences and does not appear short of breath  Cardiovascular: Anthony lower extremity edema, non tender, Anthony erythema  Skin: Warm dry intact with Anthony signs of infection or rash on extremities or on axial skeleton.  Abdomen: Soft nontender  Neuro: Cranial nerves II through XII are intact, neurovascularly intact in all extremities with 2+ DTRs and 2+ pulses.  Lymph: Anthony lymphadenopathy of posterior or anterior cervical chain or axillae bilaterally.  Gait normal with good balance and coordination.  MSK:  tender with full range of motion and good stability and symmetric strength and tone of shoulders, elbows, wrist, hip, knee and ankles bilaterally.   Back exam shows some mild loss of lordosis.  Still tightness with straight leg test but Anthony radicular symptoms.  Mild discomfort in the paraspinal musculature in the lumbar spine.  5 out of 5 strength in lower extremities and deep tendon reflexes intact    Impression and Recommendations:      The above documentation has been reviewed and is accurate and complete Lyndal Pulley, DO       Note: This dictation was prepared with Dragon dictation along with smaller phrase technology. Any transcriptional errors that result  from this process are unintentional.

## 2019-01-05 NOTE — Patient Instructions (Signed)
Exercises 2x a week Stay active  See me when you need me  [phone]

## 2019-01-05 NOTE — Assessment & Plan Note (Signed)
Patient is doing significantly better with conservative therapy at this time.  Discussed icing regimen and home exercises, discussed which activities to do which wants to avoid.  Increase activity as tolerated.  Follow-up with me on an as-needed basis.

## 2019-02-10 ENCOUNTER — Other Ambulatory Visit: Payer: Self-pay | Admitting: Family Medicine

## 2019-02-10 NOTE — Telephone Encounter (Signed)
Last OV 08/26/18 Last refill 07/21/18 #90/1 Next OV not scheduled

## 2019-02-11 ENCOUNTER — Telehealth: Payer: Self-pay

## 2019-02-11 MED FILL — BYSTOLIC 10 MG TABLET: 10 | 90 days supply | Qty: 90 | Fill #0

## 2019-02-11 NOTE — Telephone Encounter (Signed)
PA initiated via Covermymeds; KEY: [id number]. PA approved.

## 2019-04-18 ENCOUNTER — Other Ambulatory Visit: Payer: Self-pay | Admitting: Family Medicine

## 2019-04-19 ENCOUNTER — Other Ambulatory Visit: Payer: Self-pay | Admitting: Family Medicine

## 2019-04-19 MED FILL — ALLOPURINOL 100 MG TABS: 100 | 90 days supply | Qty: 90 | Fill #0

## 2019-04-19 MED FILL — LISINOPRIL 40 MG TABS: 40 | 90 days supply | Qty: 90 | Fill #0

## 2019-04-27 ENCOUNTER — Other Ambulatory Visit: Payer: Self-pay

## 2019-04-28 ENCOUNTER — Ambulatory Visit: Payer: BC Managed Care – PPO | Admitting: Family Medicine

## 2019-04-28 ENCOUNTER — Other Ambulatory Visit: Payer: Self-pay | Admitting: Family Medicine

## 2019-04-28 VITALS — BP 144/66 | HR 56 | Temp 98.7°F | Resp 12 | Ht 70.0 in | Wt 309.0 lb

## 2019-04-28 DIAGNOSIS — I10 Essential (primary) hypertension: Secondary | ICD-10-CM

## 2019-04-28 DIAGNOSIS — E782 Mixed hyperlipidemia: Secondary | ICD-10-CM | POA: Diagnosis not present

## 2019-04-28 DIAGNOSIS — R739 Hyperglycemia, unspecified: Secondary | ICD-10-CM

## 2019-04-28 DIAGNOSIS — M1000 Idiopathic gout, unspecified site: Secondary | ICD-10-CM

## 2019-04-28 DIAGNOSIS — K219 Gastro-esophageal reflux disease without esophagitis: Secondary | ICD-10-CM

## 2019-04-28 LAB — CBC
HCT: 45.6 % (ref 39.0–52.0)
Hemoglobin: 15.5 g/dL (ref 13.0–17.0)
MCHC: 33.9 g/dL (ref 30.0–36.0)
MCV: 87.5 fl (ref 78.0–100.0)
Platelets: 238 10*3/uL (ref 150.0–400.0)
RBC: 5.21 Mil/uL (ref 4.22–5.81)
RDW: 13.7 % (ref 11.5–15.5)
WBC: 8.4 10*3/uL (ref 4.0–10.5)

## 2019-04-28 LAB — LIPID PANEL
Cholesterol: 145 mg/dL (ref 0–200)
HDL: 32.6 mg/dL — ABNORMAL LOW (ref >39.00)
NonHDL: 112.77
Total CHOL/HDL Ratio: 4
Triglycerides: 305 mg/dL — ABNORMAL HIGH (ref 0.0–149.0)
VLDL: 61 mg/dL — ABNORMAL HIGH (ref 0.0–40.0)

## 2019-04-28 LAB — LDL CHOLESTEROL, DIRECT: Direct LDL: 74 mg/dL

## 2019-04-28 LAB — COMPREHENSIVE METABOLIC PANEL
ALT: 37 U/L (ref 0–53)
AST: 25 U/L (ref 0–37)
Albumin: 4.2 g/dL (ref 3.5–5.2)
Alkaline Phosphatase: 54 U/L (ref 39–117)
BUN: 12 mg/dL (ref 6–23)
CO2: 31 mEq/L (ref 19–32)
Calcium: 9.2 mg/dL (ref 8.4–10.5)
Chloride: 102 mEq/L (ref 96–112)
Creatinine, Ser: 1.12 mg/dL (ref 0.40–1.50)
GFR: 67.67 mL/min (ref >60.00)
Glucose, Bld: 93 mg/dL (ref 70–99)
Potassium: 4.6 mEq/L (ref 3.5–5.1)
Sodium: 140 mEq/L (ref 135–145)
Total Bilirubin: 0.8 mg/dL (ref 0.2–1.2)
Total Protein: 6.4 g/dL (ref 6.0–8.3)

## 2019-04-28 LAB — HEMOGLOBIN A1C: Hgb A1c MFr Bld: 6.3 % (ref 4.6–6.5)

## 2019-04-28 LAB — TSH: TSH: 1.42 u[IU]/mL (ref 0.35–4.50)

## 2019-04-28 LAB — URIC ACID: Uric Acid, Serum: 7.1 mg/dL (ref 4.0–7.8)

## 2019-04-28 MED ORDER — ALLOPURINOL 100 MG PO TABS: 100.0000 mg | ORAL_TABLET | Freq: Every day | ORAL | 3 refills | Status: DC

## 2019-04-28 MED ORDER — ATORVASTATIN CALCIUM 80 MG PO TABS: 80.0000 mg | ORAL_TABLET | Freq: Every day | ORAL | 3 refills | Status: DC

## 2019-04-28 MED ORDER — SILDENAFIL CITRATE 20 MG PO TABS: 20.0000 mg | ORAL_TABLET | Freq: Every day | ORAL | 2 refills | Status: DC | PRN

## 2019-04-28 MED ORDER — NEBIVOLOL HCL 10 MG PO TABS: ORAL_TABLET | ORAL | 1 refills | Status: DC

## 2019-04-28 MED ORDER — FAMOTIDINE 40 MG PO TABS: 40.0000 mg | ORAL_TABLET | Freq: Every evening | ORAL | 1 refills | Status: DC | PRN

## 2019-04-28 MED ORDER — GABAPENTIN 300 MG PO CAPS: 300.0000 mg | ORAL_CAPSULE | Freq: Three times a day (TID) | ORAL | 1 refills | Status: DC

## 2019-04-28 MED FILL — ATORVASTATIN 80 MG TABLET: 80 | 90 days supply | Qty: 90 | Fill #0

## 2019-04-28 MED FILL — GABAPENTIN 300 MG CAPSULE: 300 | 30 days supply | Qty: 90 | Fill #0

## 2019-04-28 MED FILL — FAMOTIDINE 40 MG TABLET: 40 | 90 days supply | Qty: 90 | Fill #0

## 2019-04-28 NOTE — Patient Instructions (Signed)
Omron Blood Pressure cuff, upper arm, want BP 100-140/60-90 Pulse oximeter, want oxygen in 90s  Weekly vitals  Take Multivitamin with minerals, selenium Vitamin D 1000-2000 IU daily Probiotic with lactobacillus and bifidophilus Asprin EC 81 mg daily Fish oil or Krill oil  Melatonin 2-5 mg at bedtime  [email or web address]

## 2019-04-28 NOTE — Assessment & Plan Note (Signed)
Well controlled, no changes to meds. Encouraged heart healthy diet such as the DASH diet and exercise as tolerated.  °

## 2019-04-28 NOTE — Assessment & Plan Note (Signed)
hgba1c acceptable, minimize simple carbs. Increase exercise as tolerated.  

## 2019-04-29 ENCOUNTER — Telehealth: Payer: Self-pay | Admitting: *Deleted

## 2019-04-29 ENCOUNTER — Telehealth: Payer: Self-pay | Admitting: Family Medicine

## 2019-04-29 ENCOUNTER — Other Ambulatory Visit: Payer: Self-pay | Admitting: *Deleted

## 2019-04-29 DIAGNOSIS — E782 Mixed hyperlipidemia: Secondary | ICD-10-CM

## 2019-04-29 MED ORDER — COLESEVELAM HCL 3.75 G PO PACK: 1.0000 | PACK | Freq: Every day | ORAL | 3 refills | Status: DC

## 2019-04-29 NOTE — Telephone Encounter (Signed)
Patient called and stated that pharmacy called and stated that the [facility] packet is very expensive. He would like to know if he can take the tablet form?

## 2019-05-01 NOTE — Telephone Encounter (Signed)
Absolutely welchol 625 mg tabs, 3 tabs po bid, disp 30 day supply with 3 rf

## 2019-05-01 NOTE — Progress Notes (Signed)
Subjective:    Patient ID: Barry Anthony, male    DOB: 08/20/1960, 59 y.o.   MRN: [id number]  Chief Complaint  Patient presents with  . Follow-up    HPI Patient is in today for follow up on chronic medical concerns. No recent febrile illness or hospitalizations. Is maintaining quarantine. Tries to maintain a heart healthy diet and stay active.has recently lost some weight after gaining a good bit during the pandemic. Denies CP/palp/SOB/HA/congestion/fevers/GI or GU c/o. Taking meds as prescribed  Past Medical History:  Diagnosis Date  . Allergic rhinitis   . Allergy   . Bilateral hydrocele 12/08/2012  . GERD (gastroesophageal reflux disease)   . History of chicken pox   . Hyperglycemia 08/15/2008   Qualifier: Diagnosis of  By: Wynona Luna    . Hyperlipidemia   . Hypertension   . Lipoma of shoulder   . Preventative health care 02/12/2014  . Testicular pain, left 08/04/2016    Past Surgical History:  Procedure Laterality Date  . EYE SURGERY    . HERNIA REPAIR     groin  . KNEE SURGERY  1981   right knee  . TONSILLECTOMY  1969  . WISDOM TOOTH EXTRACTION      Family History  Problem Relation Age of Onset  . Thyroid cancer Father   . Hypertension Father   . Coronary artery disease Father   . Hyperlipidemia Father   . Alcohol abuse Father        smoker  . Heart disease Father   . Hypothyroidism Mother   . Dementia Maternal Aunt   . Dementia Maternal Uncle   . Cancer Maternal Grandmother        breast  . Heart disease Paternal Grandmother        heart disease  . Other Unknown        no colon cancer, no prostate canceer  . Colon cancer Neg Hx     Social History   Socioeconomic History  . Marital status: Divorced    Spouse name: Not on file  . Number of children: Not on file  . Years of education: Not on file  . Highest education level: Not on file  Occupational History  . Not on file  Tobacco Use  . Smoking status: Never Smoker  . Smokeless tobacco:  Never Used  Substance and Sexual Activity  . Alcohol use: Yes    Comment: 2 drinks / week  . Drug use: No  . Sexual activity: Yes    Comment: lives with wife, works as a Public house manager at [employer]. no major dietary restrictions  Other Topics Concern  . Not on file  Social History Narrative   Occupation:  Parts mgr   Married 16 yrs    2 children  10, 12    Never Smoked    Alcohol use-yes (social)    Social Determinants of Health   Financial Resource Strain:   . Difficulty of Paying Living Expenses:   Food Insecurity:   . Worried About Charity fundraiser in the Last Year:   . Arboriculturist in the Last Year:   Transportation Needs:   . Film/video editor (Medical):   Marland Kitchen Lack of Transportation (Non-Medical):   Physical Activity:   . Days of Exercise per Week:   . Minutes of Exercise per Session:   Stress:   . Feeling of Stress :   Social Connections:   . Frequency of Communication  with Friends and Family:   . Frequency of Social Gatherings with Friends and Family:   . Attends Religious Services:   . Active Member of Clubs or Organizations:   . Attends Archivist Meetings:   Marland Kitchen Marital Status:   Intimate Partner Violence:   . Fear of Current or Ex-Partner:   . Emotionally Abused:   Marland Kitchen Physically Abused:   . Sexually Abused:     Outpatient Medications Prior to Visit  Medication Sig Dispense Refill  . albuterol (PROAIR HFA) 108 (90 Base) MCG/ACT inhaler INHALE 2 PUFFS INTO THE LUNGS DAILY AS NEEDED 8.5 g 3  . budesonide-formoterol (SYMBICORT) 80-4.5 MCG/ACT inhaler Inhale 2 puffs into the lungs 2 (two) times daily. 1 Inhaler 3  . fluticasone (FLONASE) 50 MCG/ACT nasal spray Place 2 sprays into both nostrils daily as needed. 48 g 3  . gabapentin (NEURONTIN) 100 MG capsule Take 2 capsules (200 mg total) by mouth at bedtime. 180 capsule 0  . lisinopril (ZESTRIL) 40 MG tablet TAKE 1 TABLET (40 MG TOTAL) BY MOUTH DAILY. 90 tablet 3  . omeprazole  (PRILOSEC) 40 MG capsule Take 1 capsule (40 mg total) by mouth daily. 30 capsule 5  . tiZANidine (ZANAFLEX) 4 MG tablet Take 0.5-1 tablets (2-4 mg total) by mouth every 8 (eight) hours as needed for muscle spasms. 30 tablet 1  . allopurinol (ZYLOPRIM) 100 MG tablet TAKE 1 TABLET (100 MG TOTAL) BY MOUTH DAILY. 90 tablet 3  . atorvastatin (LIPITOR) 80 MG tablet Take 1 tablet (80 mg total) by mouth daily. 90 tablet 3  . nebivolol (BYSTOLIC) 10 MG tablet TAKE 1 TABLET (10 MG TOTAL) BY MOUTH DAILY. 90 tablet 0  . sildenafil (REVATIO) 20 MG tablet Take 1-5 tablets (20-100 mg total) by mouth daily as needed. 150 tablet 2  . acetaminophen (TYLENOL) 500 MG tablet Take 1-2 tablets (500-1,000 mg total) by mouth every 8 (eight) hours as needed for moderate pain. 100 tablet 2  . ibuprofen (ADVIL) 200 MG tablet Take 1-2 tablets (200-400 mg total) by mouth every 8 (eight) hours as needed for moderate pain. 100 tablet 2  . methylPREDNISolone (MEDROL) 4 MG tablet 5 tab po qd X 3d then 4 tab po qd X 3d then 3 tab po qd X 3d then 2 tab po qd  X 3 d then 1 tab po qd x 3 d then 1/2 tab po qd x 3d 47 tablet 0   No facility-administered medications prior to visit.    No Known Allergies  Review of Systems  Constitutional: Negative for fever and malaise/fatigue.  HENT: Negative for congestion.   Eyes: Negative for blurred vision.  Respiratory: Negative for shortness of breath.   Cardiovascular: Negative for chest pain, palpitations and leg swelling.  Gastrointestinal: Negative for abdominal pain, blood in stool and nausea.  Genitourinary: Negative for dysuria and frequency.  Musculoskeletal: Negative for falls.  Skin: Negative for rash.  Neurological: Negative for dizziness, loss of consciousness and headaches.  Endo/Heme/Allergies: Negative for environmental allergies.  Psychiatric/Behavioral: Negative for depression. The patient is not nervous/anxious.        Objective:    Physical Exam Vitals and nursing  note reviewed.  Constitutional:      General: He is not in acute distress.    Appearance: He is well-developed.  HENT:     Head: Normocephalic and atraumatic.     Nose: Nose normal.  Eyes:     General:        Right  eye: No discharge.        Left eye: No discharge.  Cardiovascular:     Rate and Rhythm: Normal rate and regular rhythm.     Heart sounds: No murmur.  Pulmonary:     Effort: Pulmonary effort is normal.     Breath sounds: Normal breath sounds.  Abdominal:     General: Bowel sounds are normal.     Palpations: Abdomen is soft.     Tenderness: There is no abdominal tenderness.  Musculoskeletal:     Cervical back: Normal range of motion and neck supple.  Skin:    General: Skin is warm and dry.  Neurological:     Mental Status: He is alert and oriented to person, place, and time.     BP (!) 144/66 (BP Location: Left Arm, Cuff Size: Large)   Pulse (!) 56   Temp 98.7 F (37.1 C) (Temporal)   Resp 12   Ht 5\' 10"  (1.778 m)   Wt (!) 309 lb (140.2 kg)   SpO2 98%   BMI 44.34 kg/m  Wt Readings from Last 3 Encounters:  04/28/19 (!) 309 lb (140.2 kg)  01/05/19 (!) 320 lb (145.2 kg)  12/09/18 (!) 320 lb (145.2 kg)    Diabetic Foot Exam - Simple   No data filed     Lab Results  Component Value Date   WBC 8.4 04/28/2019   HGB 15.5 04/28/2019   HCT 45.6 04/28/2019   PLT 238.0 04/28/2019   GLUCOSE 93 04/28/2019   CHOL 145 04/28/2019   TRIG 305.0 (H) 04/28/2019   HDL 32.60 (L) 04/28/2019   LDLDIRECT 74.0 04/28/2019   LDLCALC 58 12/27/2015   ALT 37 04/28/2019   AST 25 04/28/2019   NA 140 04/28/2019   K 4.6 04/28/2019   CL 102 04/28/2019   CREATININE 1.12 04/28/2019   BUN 12 04/28/2019   CO2 31 04/28/2019   TSH 1.42 04/28/2019   PSA 1.46 11/24/2017   HGBA1C 6.3 04/28/2019    Lab Results  Component Value Date   TSH 1.42 04/28/2019   Lab Results  Component Value Date   WBC 8.4 04/28/2019   HGB 15.5 04/28/2019   HCT 45.6 04/28/2019   MCV 87.5  04/28/2019   PLT 238.0 04/28/2019   Lab Results  Component Value Date   NA 140 04/28/2019   K 4.6 04/28/2019   CO2 31 04/28/2019   GLUCOSE 93 04/28/2019   BUN 12 04/28/2019   CREATININE 1.12 04/28/2019   BILITOT 0.8 04/28/2019   ALKPHOS 54 04/28/2019   AST 25 04/28/2019   ALT 37 04/28/2019   PROT 6.4 04/28/2019   ALBUMIN 4.2 04/28/2019   CALCIUM 9.2 04/28/2019   GFR 67.67 04/28/2019   Lab Results  Component Value Date   CHOL 145 04/28/2019   Lab Results  Component Value Date   HDL 32.60 (L) 04/28/2019   Lab Results  Component Value Date   LDLCALC 58 12/27/2015   Lab Results  Component Value Date   TRIG 305.0 (H) 04/28/2019   Lab Results  Component Value Date   CHOLHDL 4 04/28/2019   Lab Results  Component Value Date   HGBA1C 6.3 04/28/2019       Assessment & Plan:   Problem List Items Addressed This Visit    Hyperlipidemia, mixed - Primary    Encouraged heart healthy diet, increase exercise, avoid trans fats, consider a krill oil cap daily, tolerating Atorvastatin      Relevant Medications  nebivolol (BYSTOLIC) 10 MG tablet   atorvastatin (LIPITOR) 80 MG tablet   sildenafil (REVATIO) 20 MG tablet   Other Relevant Orders   Lipid panel (Completed)   Essential hypertension    Well controlled, no changes to meds. Encouraged heart healthy diet such as the DASH diet and exercise as tolerated.       Relevant Medications   nebivolol (BYSTOLIC) 10 MG tablet   atorvastatin (LIPITOR) 80 MG tablet   sildenafil (REVATIO) 20 MG tablet   Other Relevant Orders   CBC (Completed)   Comprehensive metabolic panel (Completed)   TSH (Completed)   GERD   Relevant Medications   famotidine (PEPCID) 40 MG tablet   Hyperglycemia    hgba1c acceptable, minimize simple carbs. Increase exercise as tolerated.       Relevant Orders   Hemoglobin A1c (Completed)   Gout    No recent flare, hydrate and monitor uric acid levels given refill on Allopurinol      Relevant  Orders   Uric acid (Completed)      I have discontinued Bryley E. Kelsay's acetaminophen, ibuprofen, and methylPREDNISolone. I am also having him start on gabapentin and famotidine. Additionally, I am having him maintain his fluticasone, albuterol, budesonide-formoterol, tiZANidine, omeprazole, gabapentin, lisinopril, nebivolol, allopurinol, atorvastatin, and sildenafil.  Meds ordered this encounter  Medications  . nebivolol (BYSTOLIC) 10 MG tablet    Sig: TAKE 1 TABLET (10 MG TOTAL) BY MOUTH DAILY.    Dispense:  90 tablet    Refill:  1  . allopurinol (ZYLOPRIM) 100 MG tablet    Sig: Take 1 tablet (100 mg total) by mouth daily.    Dispense:  90 tablet    Refill:  3  . atorvastatin (LIPITOR) 80 MG tablet    Sig: Take 1 tablet (80 mg total) by mouth daily.    Dispense:  90 tablet    Refill:  3    D/c previous script  . sildenafil (REVATIO) 20 MG tablet    Sig: Take 1-5 tablets (20-100 mg total) by mouth daily as needed.    Dispense:  150 tablet    Refill:  2  . gabapentin (NEURONTIN) 300 MG capsule    Sig: Take 1 capsule (300 mg total) by mouth 3 (three) times daily.    Dispense:  90 capsule    Refill:  1  . famotidine (PEPCID) 40 MG tablet    Sig: Take 1 tablet (40 mg total) by mouth at bedtime as needed for heartburn or indigestion.    Dispense:  90 tablet    Refill:  1     Penni Homans, MD

## 2019-05-01 NOTE — Assessment & Plan Note (Signed)
Encouraged heart healthy diet, increase exercise, avoid trans fats, consider a krill oil cap daily, tolerating Atorvastatin 

## 2019-05-01 NOTE — Assessment & Plan Note (Signed)
No recent flare, hydrate and monitor uric acid levels given refill on Allopurinol

## 2019-05-02 ENCOUNTER — Other Ambulatory Visit: Payer: Self-pay | Admitting: Family Medicine

## 2019-05-02 MED ORDER — COLESEVELAM HCL 625 MG PO TABS: 1875.0000 mg | ORAL_TABLET | Freq: Two times a day (BID) | ORAL | 3 refills | Status: DC

## 2019-05-02 MED FILL — tiZANidine HCL 4 MG TABS: 4 | 10 days supply | Qty: 30 | Fill #1

## 2019-05-02 MED FILL — COLESEVELAM HCL 625 MG TABS: 625 | 30 days supply | Qty: 180 | Fill #0

## 2019-05-02 NOTE — Telephone Encounter (Signed)
Patient notified that new rx has been sent in for the tablets.

## 2019-05-25 IMAGING — DX DG CHEST 2V
2 series · 2 of 2 positions shown · non-contrast
Comparison: 02/19/2018

CLINICAL DATA: Coughing for 2-3 months.  Congestion.

EXAM:
CHEST - 2 VIEW

[chest pa]
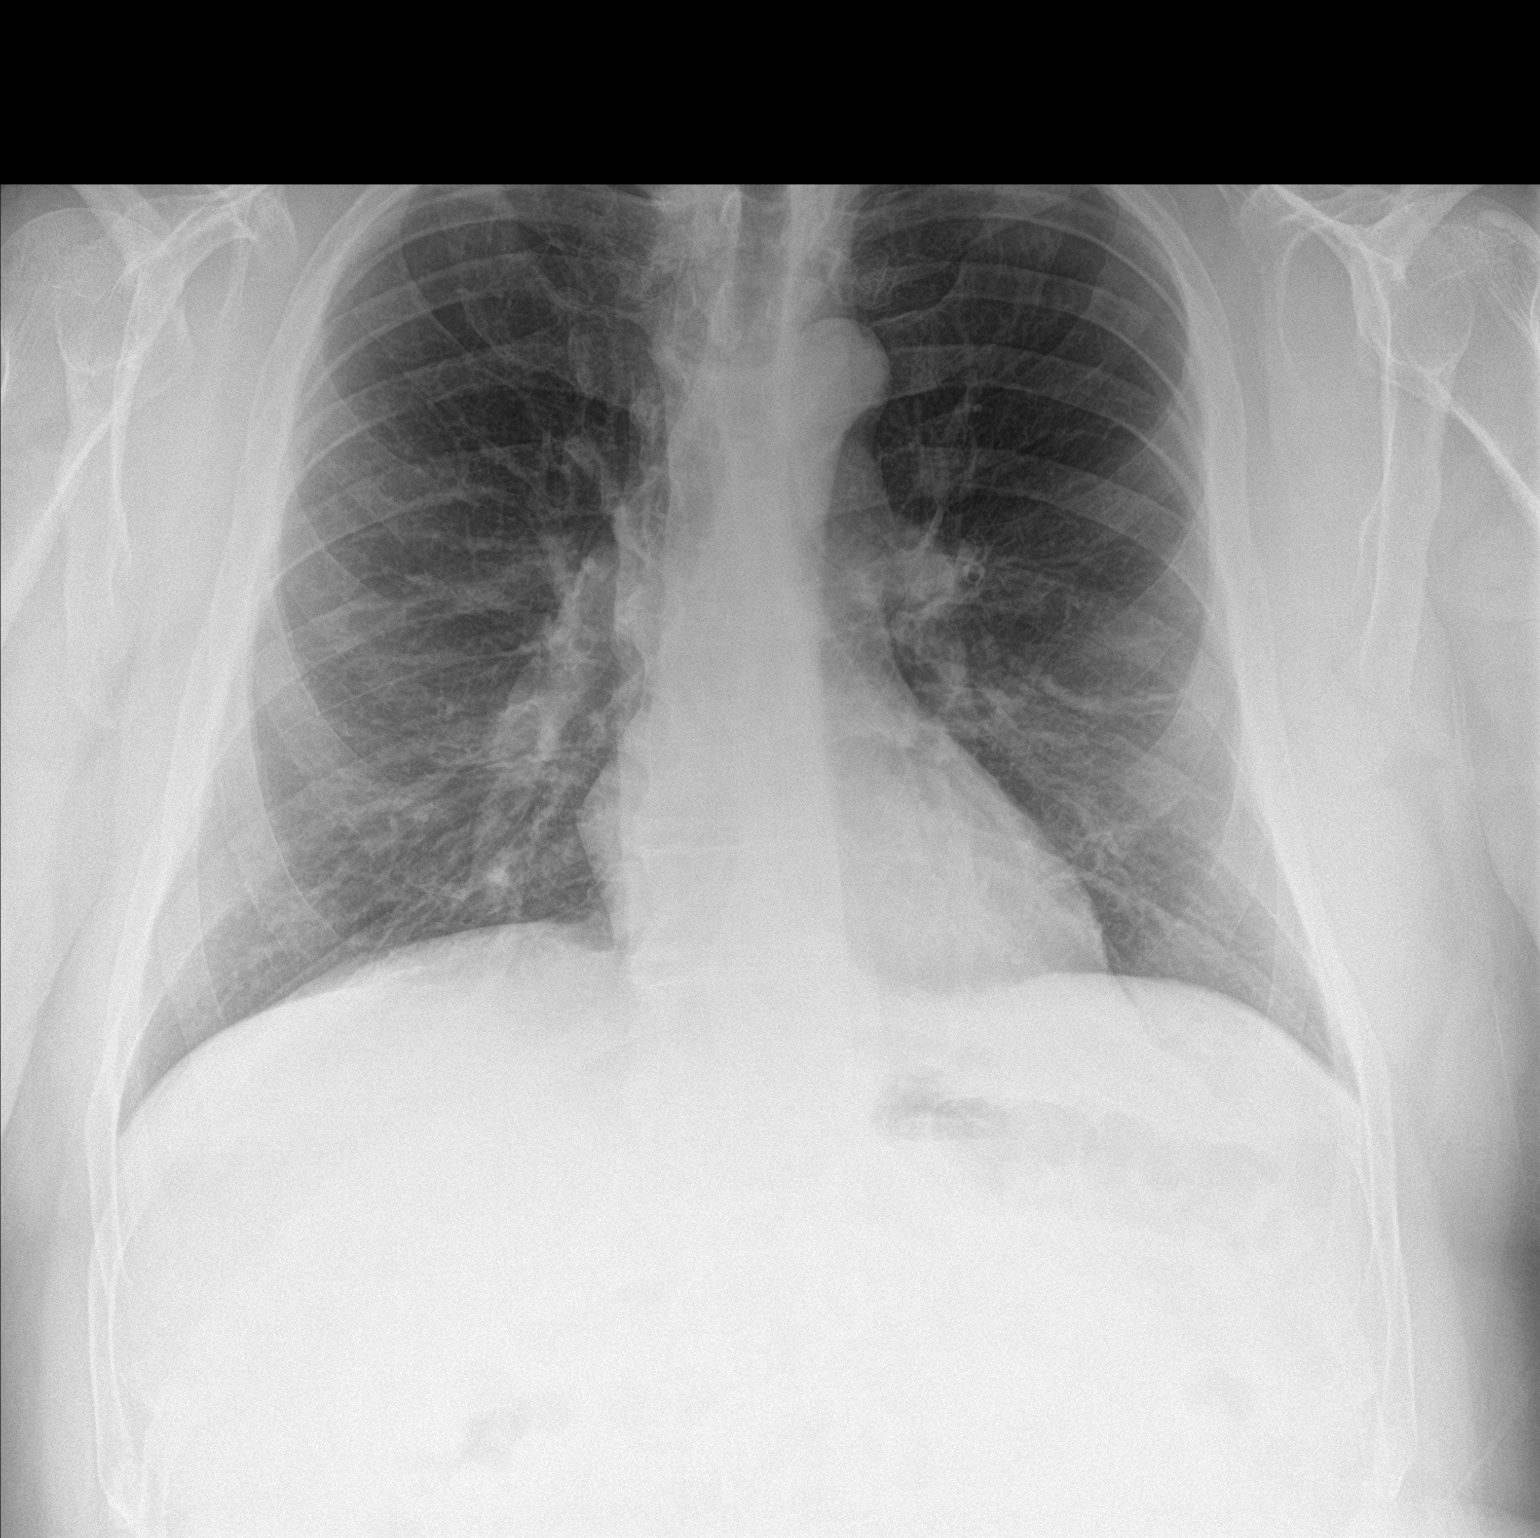

[chest lat]
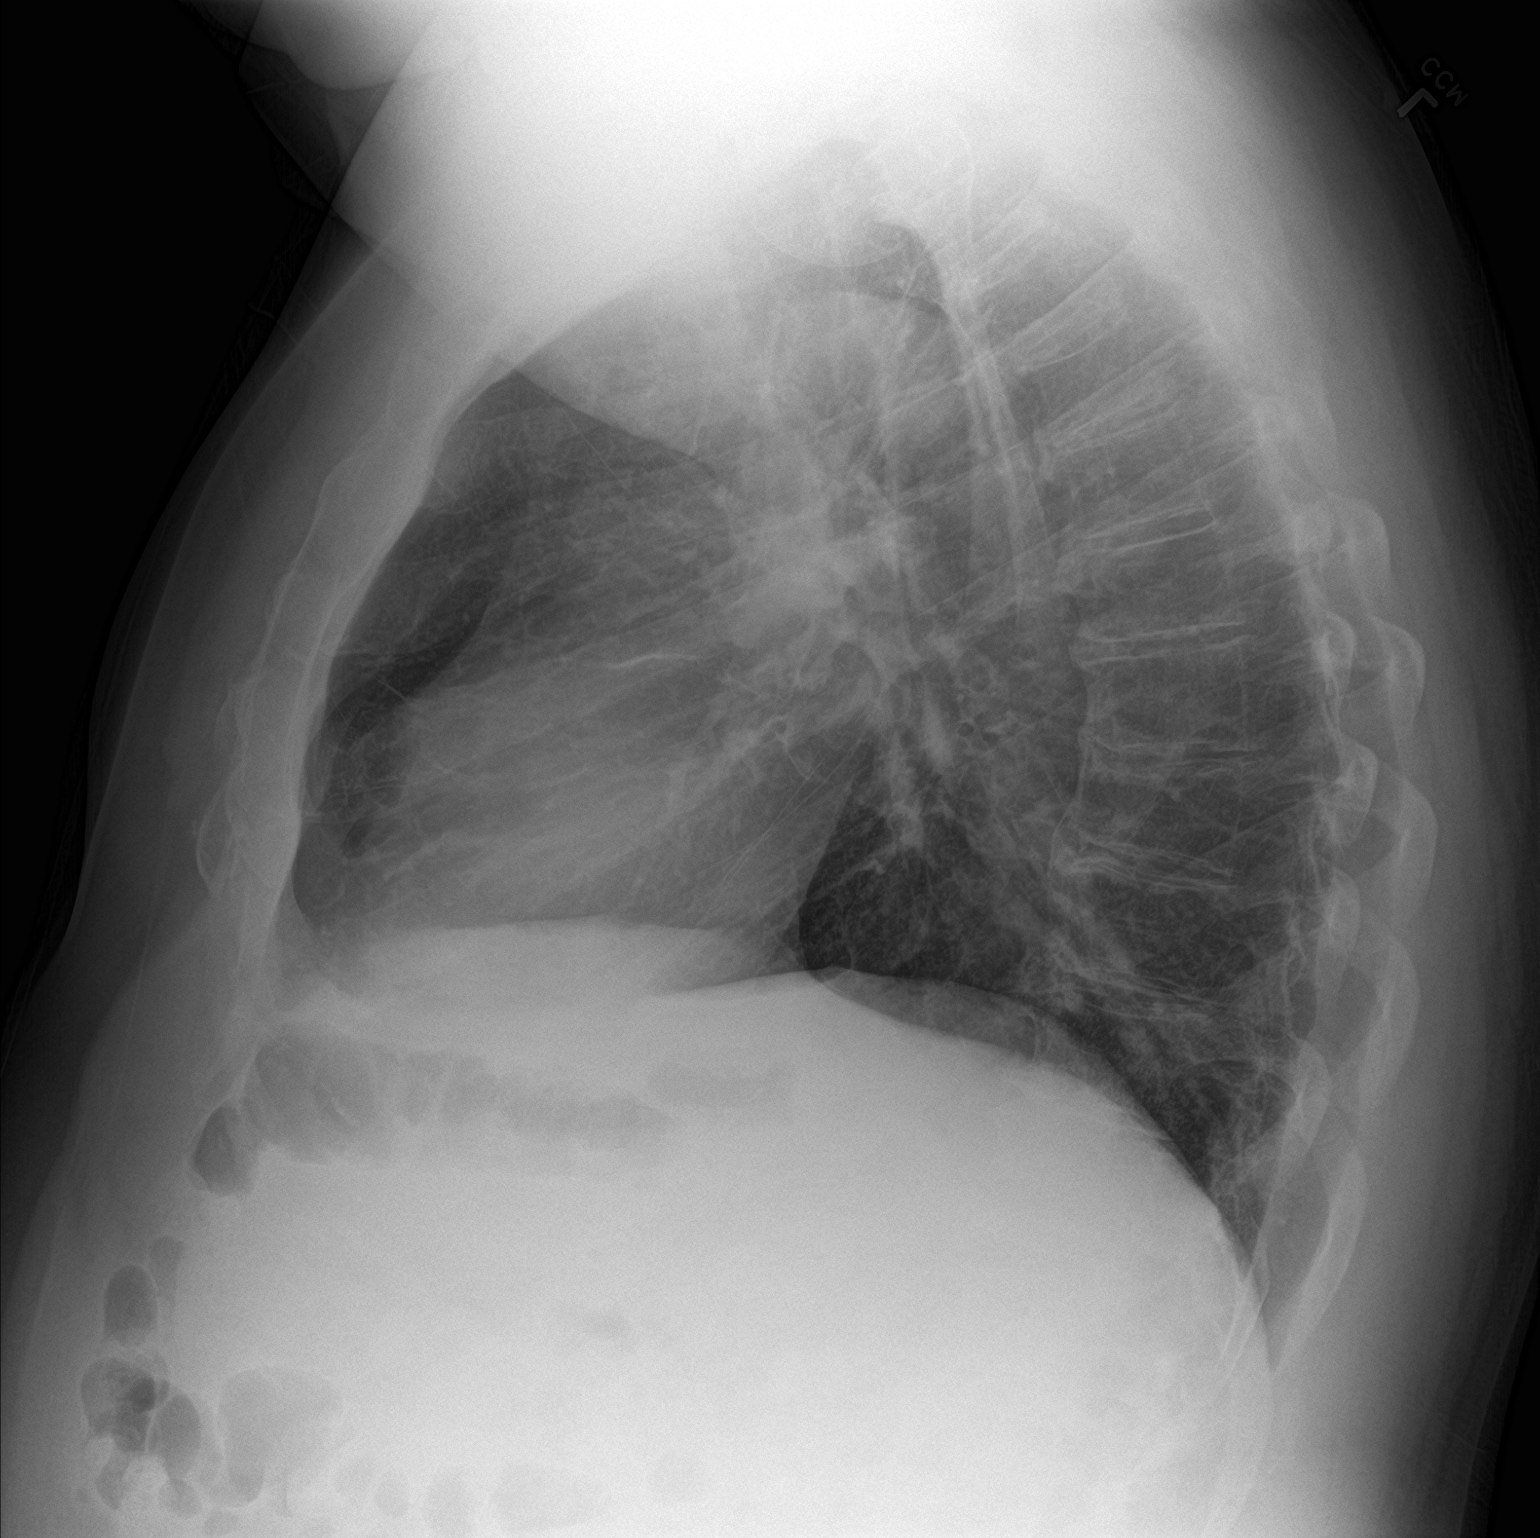

[2 of 2 positions shown; findings below may reference images not displayed]

FINDINGS: Cardiac silhouette is normal in size. No mediastinal or hilar
masses. There is no evidence of adenopathy.

Lungs are clear.  No pleural effusion or pneumothorax.

Skeletal structures are intact.
IMPRESSION: No active cardiopulmonary disease.

## 2019-05-25 MED FILL — BYSTOLIC 10 MG TABLET: 10 | 90 days supply | Qty: 90 | Fill #0

## 2019-07-01 MED FILL — COLESEVELAM HCL 625 MG TABS: 625 | 30 days supply | Qty: 180 | Fill #1

## 2019-07-29 ENCOUNTER — Other Ambulatory Visit: Payer: Self-pay

## 2019-07-29 ENCOUNTER — Other Ambulatory Visit (INDEPENDENT_AMBULATORY_CARE_PROVIDER_SITE_OTHER): Payer: BC Managed Care – PPO

## 2019-07-29 DIAGNOSIS — E782 Mixed hyperlipidemia: Secondary | ICD-10-CM

## 2019-07-29 LAB — LIPID PANEL
Cholesterol: 119 mg/dL (ref 0–200)
HDL: 36 mg/dL — ABNORMAL LOW (ref >39.00)
LDL Cholesterol: 47 mg/dL (ref 0–99)
NonHDL: 83.04
Total CHOL/HDL Ratio: 3
Triglycerides: 178 mg/dL — ABNORMAL HIGH (ref 0.0–149.0)
VLDL: 35.6 mg/dL (ref 0.0–40.0)

## 2019-07-29 MED FILL — LISINOPRIL 40 MG TABS: 40 | 90 days supply | Qty: 90 | Fill #1

## 2019-07-29 MED FILL — ALLOPURINOL 100 MG TABS: 100 | 90 days supply | Qty: 90 | Fill #1

## 2019-09-09 MED FILL — ATORVASTATIN 80 MG TABLET: 80 | 90 days supply | Qty: 90 | Fill #1

## 2019-09-09 MED FILL — BYSTOLIC 10 MG TABLET: 10 | 90 days supply | Qty: 90 | Fill #1

## 2019-09-22 MED FILL — COLESEVELAM HCL 625 MG TABS: 625 | 30 days supply | Qty: 180 | Fill #2

## 2019-11-01 ENCOUNTER — Other Ambulatory Visit: Payer: Self-pay

## 2019-11-01 ENCOUNTER — Encounter: Payer: Self-pay | Admitting: Family Medicine

## 2019-11-01 ENCOUNTER — Ambulatory Visit: Payer: BC Managed Care – PPO | Admitting: Family Medicine

## 2019-11-01 VITALS — BP 122/68 | HR 83 | Temp 98.0°F | Resp 16 | Ht 70.0 in | Wt 298.2 lb

## 2019-11-01 DIAGNOSIS — R351 Nocturia: Secondary | ICD-10-CM | POA: Diagnosis not present

## 2019-11-01 DIAGNOSIS — Z23 Encounter for immunization: Secondary | ICD-10-CM

## 2019-11-01 DIAGNOSIS — R739 Hyperglycemia, unspecified: Secondary | ICD-10-CM | POA: Diagnosis not present

## 2019-11-01 DIAGNOSIS — Z0000 Encounter for general adult medical examination without abnormal findings: Secondary | ICD-10-CM

## 2019-11-01 DIAGNOSIS — R252 Cramp and spasm: Secondary | ICD-10-CM

## 2019-11-01 DIAGNOSIS — E782 Mixed hyperlipidemia: Secondary | ICD-10-CM | POA: Diagnosis not present

## 2019-11-01 DIAGNOSIS — I10 Essential (primary) hypertension: Secondary | ICD-10-CM | POA: Diagnosis not present

## 2019-11-01 DIAGNOSIS — M1000 Idiopathic gout, unspecified site: Secondary | ICD-10-CM

## 2019-11-01 NOTE — Assessment & Plan Note (Signed)
hgba1c acceptable, minimize simple carbs. Increase exercise as tolerated.  

## 2019-11-01 NOTE — Patient Instructions (Addendum)
hyland's leg cramp meds, hydrate   Preventive Care 57-57 Years Old, Male Preventive care refers to lifestyle choices and visits with your health care provider that can promote health and wellness. This includes:  A yearly physical exam. This is also called an annual well check.  Regular dental and eye exams.  Immunizations.  Screening for certain conditions.  Healthy lifestyle choices, such as eating a healthy diet, getting regular exercise, not using drugs or products that contain nicotine and tobacco, and limiting alcohol use. What can I expect for my preventive care visit? Physical exam Your health care provider will check:  Height and weight. These may be used to calculate body mass index (BMI), which is a measurement that tells if you are at a healthy weight.  Heart rate and blood pressure.  Your skin for abnormal spots. Counseling Your health care provider may ask you questions about:  Alcohol, tobacco, and drug use.  Emotional well-being.  Home and relationship well-being.  Sexual activity.  Eating habits.  Work and work Statistician. What immunizations do I need?  Influenza (flu) vaccine  This is recommended every year. Tetanus, diphtheria, and pertussis (Tdap) vaccine  You may need a Td booster every 10 years. Varicella (chickenpox) vaccine  You may need this vaccine if you have not already been vaccinated. Zoster (shingles) vaccine  You may need this after age 59. Measles, mumps, and rubella (MMR) vaccine  You may need at least one dose of MMR if you were born in 1957 or later. You may also need a second dose. Pneumococcal conjugate (PCV13) vaccine  You may need this if you have certain conditions and were not previously vaccinated. Pneumococcal polysaccharide (PPSV23) vaccine  You may need one or two doses if you smoke cigarettes or if you have certain conditions. Meningococcal conjugate (MenACWY) vaccine  You may need this if you have certain  conditions. Hepatitis A vaccine  You may need this if you have certain conditions or if you travel or work in places where you may be exposed to hepatitis A. Hepatitis B vaccine  You may need this if you have certain conditions or if you travel or work in places where you may be exposed to hepatitis B. Haemophilus influenzae type b (Hib) vaccine  You may need this if you have certain risk factors. Human papillomavirus (HPV) vaccine  If recommended by your health care provider, you may need three doses over 6 months. You may receive vaccines as individual doses or as more than one vaccine together in one shot (combination vaccines). Talk with your health care provider about the risks and benefits of combination vaccines. What tests do I need? Blood tests  Lipid and cholesterol levels. These may be checked every 5 years, or more frequently if you are over 57 years old.  Hepatitis C test.  Hepatitis B test. Screening  Lung cancer screening. You may have this screening every year starting at age 57 if you have a 30-pack-year history of smoking and currently smoke or have quit within the past 15 years.  Prostate cancer screening. Recommendations will vary depending on your family history and other risks.  Colorectal cancer screening. All adults should have this screening starting at age 57 and continuing until age 57. Your health care provider may recommend screening at age 25 if you are at increased risk. You will have tests every 1-10 years, depending on your results and the type of screening test.  Diabetes screening. This is done by checking your blood  sugar (glucose) after you have not eaten for a while (fasting). You may have this done every 1-3 years.  Sexually transmitted disease (STD) testing. Follow these instructions at home: Eating and drinking  Eat a diet that includes fresh fruits and vegetables, whole grains, lean protein, and low-fat dairy products.  Take vitamin and  mineral supplements as recommended by your health care provider.  Do not drink alcohol if your health care provider tells you not to drink.  If you drink alcohol: ? Limit how much you have to 0-2 drinks a day. ? Be aware of how much alcohol is in your drink. In the [place] one drink equals one 12 oz bottle of beer (355 mL), one 5 oz glass of wine (148 mL), or one 1 oz glass of hard liquor (44 mL). Lifestyle  Take daily care of your teeth and gums.  Stay active. Exercise for at least 30 minutes on 5 or more days each week.  Do not use any products that contain nicotine or tobacco, such as cigarettes, e-cigarettes, and chewing tobacco. If you need help quitting, ask your health care provider.  If you are sexually active, practice safe sex. Use a condom or other form of protection to prevent STIs (sexually transmitted infections).  Talk with your health care provider about taking a low-dose aspirin every day starting at age 57. What's next?  Go to your health care provider once a year for a well check visit.  Ask your health care provider how often you should have your eyes and teeth checked.  Stay up to date on all vaccines. This information is not intended to replace advice given to you by your health care provider. Make sure you discuss any questions you have with your health care provider. Document Revised: 12/17/2017 Document Reviewed: 12/17/2017 Elsevier Patient Education  2020 [employer].

## 2019-11-01 NOTE — Progress Notes (Signed)
Pt tolerated flu vaccine

## 2019-11-02 DIAGNOSIS — R252 Cramp and spasm: Secondary | ICD-10-CM | POA: Insufficient documentation

## 2019-11-02 DIAGNOSIS — R351 Nocturia: Secondary | ICD-10-CM | POA: Insufficient documentation

## 2019-11-02 LAB — TSH: TSH: 1.66 mIU/L (ref 0.40–4.50)

## 2019-11-02 LAB — CBC
HCT: 50.3 % — ABNORMAL HIGH (ref 38.5–50.0)
Hemoglobin: 17.1 g/dL (ref 13.2–17.1)
MCH: 29.6 pg (ref 27.0–33.0)
MCHC: 34 g/dL (ref 32.0–36.0)
MCV: 87.2 fL (ref 80.0–100.0)
MPV: 10.5 fL (ref 7.5–12.5)
Platelets: 277 10*3/uL (ref 140–400)
RBC: 5.77 10*6/uL (ref 4.20–5.80)
RDW: 13.2 % (ref 11.0–15.0)
WBC: 8.1 10*3/uL (ref 3.8–10.8)

## 2019-11-02 LAB — COMPREHENSIVE METABOLIC PANEL
AG Ratio: 1.9 (calc) (ref 1.0–2.5)
ALT: 36 U/L (ref 9–46)
AST: 28 U/L (ref 10–35)
Albumin: 4.5 g/dL (ref 3.6–5.1)
Alkaline phosphatase (APISO): 64 U/L (ref 35–144)
BUN: 18 mg/dL (ref 7–25)
CO2: 25 mmol/L (ref 20–32)
Calcium: 9.8 mg/dL (ref 8.6–10.3)
Chloride: 103 mmol/L (ref 98–110)
Creat: 1.2 mg/dL (ref 0.70–1.33)
Globulin: 2.4 g/dL (calc) (ref 1.9–3.7)
Glucose, Bld: 103 mg/dL — ABNORMAL HIGH (ref 65–99)
Potassium: 4.8 mmol/L (ref 3.5–5.3)
Sodium: 139 mmol/L (ref 135–146)
Total Bilirubin: 0.7 mg/dL (ref 0.2–1.2)
Total Protein: 6.9 g/dL (ref 6.1–8.1)

## 2019-11-02 LAB — LIPID PANEL
Cholesterol: 110 mg/dL (ref <200)
HDL: 33 mg/dL — ABNORMAL LOW (ref >=40)
LDL Cholesterol (Calc): 49 mg/dL (calc)
Non-HDL Cholesterol (Calc): 77 mg/dL (calc) (ref <130)
Total CHOL/HDL Ratio: 3.3 (calc) (ref <5.0)
Triglycerides: 201 mg/dL — ABNORMAL HIGH (ref <150)

## 2019-11-02 LAB — HEMOGLOBIN A1C
Hgb A1c MFr Bld: 6.1 % of total Hgb — ABNORMAL HIGH (ref <5.7)
Mean Plasma Glucose: 128 (calc)
eAG (mmol/L): 7.1 (calc)

## 2019-11-02 LAB — MAGNESIUM: Magnesium: 1.9 mg/dL (ref 1.5–2.5)

## 2019-11-02 LAB — PSA: PSA: 1.28 ng/mL (ref <=4.0)

## 2019-11-02 NOTE — Progress Notes (Signed)
Pt reported that he seen reports and recommendation

## 2019-11-02 NOTE — Assessment & Plan Note (Signed)
No recent exacerbation 

## 2019-11-02 NOTE — Progress Notes (Signed)
Patient ID: Barry Anthony, male   DOB: June 11, 1960, 59 y.o.   MRN: [id number]   Subjective:    Patient ID: Barry Anthony, male    DOB: January 28, 1960, 60 y.o.   MRN: [id number]  Chief Complaint  Patient presents with  . Annual Exam    HPI Patient is in today for annual preventative exam and follow up on chronic medical concerns. No recent febrile illness or hospitalizations. His only complaint is some muscle cramps which are at night and infrequent. He reports they may be when he has over exerted. He notes Gabapentin has been helpful when he uses it infrequently for his pain. He has been eating well and staying as active as he can and has had steady slow weight loss over this year. No polyria or polydipsia but he does get up at night roughly once a night to urinate. Denies CP/palp/SOB/HA/congestion/fevers/GI or GU c/o. Taking meds as prescribed  Past Medical History:  Diagnosis Date  . Allergic rhinitis   . Allergy   . Bilateral hydrocele 12/08/2012  . GERD (gastroesophageal reflux disease)   . History of chicken pox   . Hyperglycemia 08/15/2008   Qualifier: Diagnosis of  By: Wynona Luna    . Hyperlipidemia   . Hypertension   . Lipoma of shoulder   . Preventative health care 02/12/2014  . Testicular pain, left 08/04/2016    Past Surgical History:  Procedure Laterality Date  . EYE SURGERY    . HERNIA REPAIR     groin  . KNEE SURGERY  1981   right knee  . TONSILLECTOMY  1969  . WISDOM TOOTH EXTRACTION      Family History  Problem Relation Age of Onset  . Thyroid cancer Father   . Hypertension Father   . Coronary artery disease Father   . Hyperlipidemia Father   . Alcohol abuse Father        smoker  . Heart disease Father   . Hypothyroidism Mother   . Arthritis Mother   . Dementia Maternal Aunt   . Dementia Maternal Uncle   . Cancer Maternal Grandmother        breast  . Heart disease Paternal Grandmother        heart disease  . Other Other        no colon cancer, no  prostate canceer  . Colon cancer Neg Hx     Social History   Socioeconomic History  . Marital status: Divorced    Spouse name: Not on file  . Number of children: Not on file  . Years of education: Not on file  . Highest education level: Not on file  Occupational History  . Not on file  Tobacco Use  . Smoking status: Never Smoker  . Smokeless tobacco: Never Used  Vaping Use  . Vaping Use: Never used  Substance and Sexual Activity  . Alcohol use: Yes    Comment: 2 drinks / week  . Drug use: No  . Sexual activity: Yes    Comment: lives with wife, works as a Public house manager at [employer]. no major dietary restrictions  Other Topics Concern  . Not on file  Social History Narrative   Occupation:  Parts mgr   Married 16 yrs    2 children  10, 12    Never Smoked    Alcohol use-yes (social)    Social Determinants of Health   Financial Resource Strain:   . Difficulty of Paying Living  Expenses: Not on file  Food Insecurity:   . Worried About Charity fundraiser in the Last Year: Not on file  . Ran Out of Food in the Last Year: Not on file  Transportation Needs:   . Lack of Transportation (Medical): Not on file  . Lack of Transportation (Non-Medical): Not on file  Physical Activity:   . Days of Exercise per Week: Not on file  . Minutes of Exercise per Session: Not on file  Stress:   . Feeling of Stress : Not on file  Social Connections:   . Frequency of Communication with Friends and Family: Not on file  . Frequency of Social Gatherings with Friends and Family: Not on file  . Attends Religious Services: Not on file  . Active Member of Clubs or Organizations: Not on file  . Attends Archivist Meetings: Not on file  . Marital Status: Not on file  Intimate Partner Violence:   . Fear of Current or Ex-Partner: Not on file  . Emotionally Abused: Not on file  . Physically Abused: Not on file  . Sexually Abused: Not on file    Outpatient Medications Prior  to Visit  Medication Sig Dispense Refill  . albuterol (PROAIR HFA) 108 (90 Base) MCG/ACT inhaler INHALE 2 PUFFS INTO THE LUNGS DAILY AS NEEDED 8.5 g 3  . allopurinol (ZYLOPRIM) 100 MG tablet Take 1 tablet (100 mg total) by mouth daily. 90 tablet 3  . atorvastatin (LIPITOR) 80 MG tablet Take 1 tablet (80 mg total) by mouth daily. 90 tablet 3  . budesonide-formoterol (SYMBICORT) 80-4.5 MCG/ACT inhaler Inhale 2 puffs into the lungs 2 (two) times daily. 1 Inhaler 3  . colesevelam (WELCHOL) 625 MG tablet Take 3 tablets (1,875 mg total) by mouth 2 (two) times daily with a meal. 180 tablet 3  . fluticasone (FLONASE) 50 MCG/ACT nasal spray Place 2 sprays into both nostrils daily as needed. 48 g 3  . gabapentin (NEURONTIN) 300 MG capsule Take 1 capsule (300 mg total) by mouth 3 (three) times daily. 90 capsule 1  . lisinopril (ZESTRIL) 40 MG tablet TAKE 1 TABLET (40 MG TOTAL) BY MOUTH DAILY. 90 tablet 3  . nebivolol (BYSTOLIC) 10 MG tablet TAKE 1 TABLET (10 MG TOTAL) BY MOUTH DAILY. 90 tablet 1  . sildenafil (REVATIO) 20 MG tablet Take 1-5 tablets (20-100 mg total) by mouth daily as needed. 150 tablet 2  . tiZANidine (ZANAFLEX) 4 MG tablet Take 0.5-1 tablets (2-4 mg total) by mouth every 8 (eight) hours as needed for muscle spasms. 30 tablet 1  . famotidine (PEPCID) 40 MG tablet Take 1 tablet (40 mg total) by mouth at bedtime as needed for heartburn or indigestion. 90 tablet 1  . gabapentin (NEURONTIN) 100 MG capsule Take 2 capsules (200 mg total) by mouth at bedtime. 180 capsule 0  . omeprazole (PRILOSEC) 40 MG capsule Take 1 capsule (40 mg total) by mouth daily. 30 capsule 5   No facility-administered medications prior to visit.    No Known Allergies  Review of Systems  Constitutional: Negative for fever.  HENT: Negative for congestion.   Eyes: Negative for blurred vision.  Respiratory: Negative for cough.   Cardiovascular: Negative for chest pain and palpitations.  Gastrointestinal: Negative for  heartburn and vomiting.  Musculoskeletal: Positive for myalgias. Negative for back pain.  Skin: Negative for rash.  Neurological: Negative for loss of consciousness and headaches.       Objective:    Physical Exam Vitals  and nursing note reviewed.  Constitutional:      General: He is not in acute distress.    Appearance: Normal appearance. He is well-developed. He is obese. He is not ill-appearing.  HENT:     Head: Normocephalic and atraumatic.     Right Ear: Tympanic membrane, ear canal and external ear normal.     Left Ear: Tympanic membrane, ear canal and external ear normal.     Nose: Nose normal.  Eyes:     General:        Right eye: No discharge.        Left eye: No discharge.  Cardiovascular:     Rate and Rhythm: Normal rate and regular rhythm.     Heart sounds: No murmur heard.   Pulmonary:     Effort: Pulmonary effort is normal.     Breath sounds: Normal breath sounds.  Abdominal:     General: Bowel sounds are normal.     Palpations: Abdomen is soft.     Tenderness: There is no abdominal tenderness.  Musculoskeletal:     Cervical back: Normal range of motion and neck supple.  Skin:    General: Skin is warm and dry.  Neurological:     Mental Status: He is alert and oriented to person, place, and time.     BP 122/68 (BP Location: Right Arm)   Pulse 83   Temp 98 F (36.7 C) (Oral)   Resp 16   Ht 5\' 10"  (1.778 m)   Wt 298 lb 3.2 oz (135.3 kg)   SpO2 97%   BMI 42.79 kg/m  Wt Readings from Last 3 Encounters:  11/01/19 298 lb 3.2 oz (135.3 kg)  04/28/19 (!) 309 lb (140.2 kg)  01/05/19 (!) 320 lb (145.2 kg)    Diabetic Foot Exam - Simple   No data filed     Lab Results  Component Value Date   WBC 8.1 11/01/2019   HGB 17.1 11/01/2019   HCT 50.3 (H) 11/01/2019   PLT 277 11/01/2019   GLUCOSE 103 (H) 11/01/2019   CHOL 110 11/01/2019   TRIG 201 (H) 11/01/2019   HDL 33 (L) 11/01/2019   LDLDIRECT 74.0 04/28/2019   LDLCALC 49 11/01/2019   ALT 36  11/01/2019   AST 28 11/01/2019   NA 139 11/01/2019   K 4.8 11/01/2019   CL 103 11/01/2019   CREATININE 1.20 11/01/2019   BUN 18 11/01/2019   CO2 25 11/01/2019   TSH 1.66 11/01/2019   PSA 1.28 11/01/2019   HGBA1C 6.1 (H) 11/01/2019    Lab Results  Component Value Date   TSH 1.66 11/01/2019   Lab Results  Component Value Date   WBC 8.1 11/01/2019   HGB 17.1 11/01/2019   HCT 50.3 (H) 11/01/2019   MCV 87.2 11/01/2019   PLT 277 11/01/2019   Lab Results  Component Value Date   NA 139 11/01/2019   K 4.8 11/01/2019   CO2 25 11/01/2019   GLUCOSE 103 (H) 11/01/2019   BUN 18 11/01/2019   CREATININE 1.20 11/01/2019   BILITOT 0.7 11/01/2019   ALKPHOS 54 04/28/2019   AST 28 11/01/2019   ALT 36 11/01/2019   PROT 6.9 11/01/2019   ALBUMIN 4.2 04/28/2019   CALCIUM 9.8 11/01/2019   GFR 67.67 04/28/2019   Lab Results  Component Value Date   CHOL 110 11/01/2019   Lab Results  Component Value Date   HDL 33 (L) 11/01/2019   Lab Results  Component Value  Date   LDLCALC 49 11/01/2019   Lab Results  Component Value Date   TRIG 201 (H) 11/01/2019   Lab Results  Component Value Date   CHOLHDL 3.3 11/01/2019   Lab Results  Component Value Date   HGBA1C 6.1 (H) 11/01/2019       Assessment & Plan:   Problem List Items Addressed This Visit    Hyperlipidemia, mixed   Relevant Orders   Lipid panel (Completed)   Essential hypertension    Well controlled, no changes to meds. Encouraged heart healthy diet such as the DASH diet and exercise as tolerated.       Relevant Orders   CBC (Completed)   Comprehensive metabolic panel (Completed)   TSH (Completed)   Hyperglycemia    hgba1c acceptable, minimize simple carbs. Increase exercise as tolerated.       Relevant Orders   Hemoglobin A1c (Completed)   Gout    No recent exacerbation      Annual physical exam    Patient encouraged to maintain heart healthy diet, regular exercise, adequate sleep. Consider daily  probiotics. Take medications as prescribed. Labs ordered or reviewed. Flu shot and Tdap given today. Colonoscopy due in 2026       Other Visit Diagnoses    Need for diphtheria-tetanus-pertussis (Tdap) vaccine    -  Primary   Relevant Orders   Tdap vaccine greater than or equal to 7yo IM (Completed)   Nocturia       Relevant Orders   PSA (Completed)   Muscle cramp       Relevant Orders   Magnesium (Completed)   Vaccine for diphtheria-tetanus-pertussis with typhoid-paratyphoid          I have discontinued Leaf E. Larmer's omeprazole and famotidine. I am also having him maintain his fluticasone, albuterol, budesonide-formoterol, tiZANidine, lisinopril, nebivolol, allopurinol, atorvastatin, sildenafil, gabapentin, and colesevelam.  No orders of the defined types were placed in this encounter.    Penni Homans, MD

## 2019-11-02 NOTE — Assessment & Plan Note (Signed)
Patient encouraged to maintain heart healthy diet, regular exercise, adequate sleep. Consider daily probiotics. Take medications as prescribed. Labs ordered or reviewed. Flu shot and Tdap given today. Colonoscopy due in 2026

## 2019-11-02 NOTE — Assessment & Plan Note (Signed)
Infrequent and occurs at night, possibly on days when he is dehydrated. He will hydrate better and notify us if

## 2019-11-02 NOTE — Assessment & Plan Note (Signed)
Mild, check PSA today

## 2019-11-02 NOTE — Assessment & Plan Note (Signed)
Well controlled, no changes to meds. Encouraged heart healthy diet such as the DASH diet and exercise as tolerated.  °

## 2019-11-15 MED FILL — ALLOPURINOL 100 MG TABLET: 100 | 90 days supply | Qty: 90 | Fill #2

## 2019-11-15 MED FILL — LISINOPRIL 40 MG TABS: 40 | 90 days supply | Qty: 90 | Fill #2

## 2019-12-13 MED FILL — COLESEVELAM HCL 625 MG TABS: 625 | 30 days supply | Qty: 180 | Fill #3

## 2019-12-21 ENCOUNTER — Other Ambulatory Visit: Payer: Self-pay | Admitting: Family Medicine

## 2019-12-21 MED FILL — BYSTOLIC 10 MG TABLET: 10 | 90 days supply | Qty: 90 | Fill #0

## 2019-12-21 MED FILL — ATORVASTATIN 80 MG TABLET: 80 | 90 days supply | Qty: 90 | Fill #2

## 2020-02-23 ENCOUNTER — Other Ambulatory Visit: Payer: Self-pay | Admitting: Family Medicine

## 2020-02-23 MED FILL — COLESEVELAM HCL 625 MG TABS: 625 | 30 days supply | Qty: 180 | Fill #0

## 2020-02-23 MED FILL — ALLOPURINOL 100 MG TABS: 100 | 90 days supply | Qty: 90 | Fill #3

## 2020-02-23 MED FILL — LISINOPRIL 40 MG TABS: 40 | 90 days supply | Qty: 90 | Fill #3

## 2020-04-07 ENCOUNTER — Other Ambulatory Visit (HOSPITAL_BASED_OUTPATIENT_CLINIC_OR_DEPARTMENT_OTHER): Payer: Self-pay

## 2020-04-07 MED FILL — Atorvastatin Calcium Tab 80 MG (Base Equivalent): ORAL | 90 days supply | Qty: 90 | Fill #0 | Status: AC

## 2020-04-07 MED FILL — Nebivolol HCl Tab 10 MG (Base Equivalent): ORAL | 90 days supply | Qty: 90 | Fill #0 | Status: AC

## 2020-04-09 ENCOUNTER — Other Ambulatory Visit (HOSPITAL_BASED_OUTPATIENT_CLINIC_OR_DEPARTMENT_OTHER): Payer: Self-pay

## 2020-04-24 ENCOUNTER — Other Ambulatory Visit (HOSPITAL_BASED_OUTPATIENT_CLINIC_OR_DEPARTMENT_OTHER): Payer: Self-pay

## 2020-04-24 MED ORDER — GABAPENTIN 300 MG PO CAPS
ORAL_CAPSULE | ORAL | 0 refills | Status: DC
  Filled 2020-04-24: qty 90, 30d supply, fill #0

## 2020-05-04 ENCOUNTER — Other Ambulatory Visit (HOSPITAL_BASED_OUTPATIENT_CLINIC_OR_DEPARTMENT_OTHER): Payer: Self-pay

## 2020-05-04 MED FILL — Colesevelam HCl Tab 625 MG: ORAL | 30 days supply | Qty: 180 | Fill #0 | Status: AC

## 2020-06-05 ENCOUNTER — Other Ambulatory Visit: Payer: Self-pay | Admitting: Family Medicine

## 2020-06-05 ENCOUNTER — Other Ambulatory Visit (HOSPITAL_BASED_OUTPATIENT_CLINIC_OR_DEPARTMENT_OTHER): Payer: Self-pay

## 2020-06-05 MED ORDER — ALLOPURINOL 100 MG PO TABS
ORAL_TABLET | Freq: Every day | ORAL | 0 refills | Status: DC
  Filled 2020-06-05: qty 90, 90d supply, fill #0

## 2020-06-05 MED ORDER — LISINOPRIL 40 MG PO TABS
ORAL_TABLET | Freq: Every day | ORAL | 0 refills | Status: DC
  Filled 2020-06-05: qty 90, 90d supply, fill #0

## 2020-07-06 ENCOUNTER — Other Ambulatory Visit: Payer: Self-pay | Admitting: Family Medicine

## 2020-07-06 ENCOUNTER — Other Ambulatory Visit (HOSPITAL_BASED_OUTPATIENT_CLINIC_OR_DEPARTMENT_OTHER): Payer: Self-pay

## 2020-07-06 MED ORDER — COLESEVELAM HCL 625 MG PO TABS
1875.0000 mg | ORAL_TABLET | Freq: Two times a day (BID) | ORAL | 0 refills | Status: DC
  Filled 2020-07-06: qty 180, 30d supply, fill #0

## 2020-07-06 MED ORDER — NEBIVOLOL HCL 10 MG PO TABS
10.0000 mg | ORAL_TABLET | Freq: Every day | ORAL | 0 refills | Status: DC
  Filled 2020-07-06: qty 90, 90d supply, fill #0

## 2020-07-06 MED ORDER — ATORVASTATIN CALCIUM 80 MG PO TABS
80.0000 mg | ORAL_TABLET | Freq: Every day | ORAL | 0 refills | Status: DC
  Filled 2020-07-06: qty 90, 90d supply, fill #0

## 2020-07-11 ENCOUNTER — Other Ambulatory Visit: Payer: Self-pay | Admitting: Family Medicine

## 2020-09-14 ENCOUNTER — Other Ambulatory Visit: Payer: Self-pay | Admitting: Family Medicine

## 2020-09-14 ENCOUNTER — Other Ambulatory Visit (HOSPITAL_BASED_OUTPATIENT_CLINIC_OR_DEPARTMENT_OTHER): Payer: Self-pay

## 2020-09-14 MED ORDER — COLESEVELAM HCL 625 MG PO TABS
1875.0000 mg | ORAL_TABLET | Freq: Two times a day (BID) | ORAL | 0 refills | Status: DC
Start: 2020-09-14 — End: 2020-10-29
  Filled 2020-09-14: qty 180, 30d supply, fill #0

## 2020-09-14 MED ORDER — LISINOPRIL 40 MG PO TABS
40.0000 mg | ORAL_TABLET | Freq: Every day | ORAL | 0 refills | Status: DC
  Filled 2020-09-14: qty 90, 90d supply, fill #0

## 2020-09-14 MED ORDER — ALLOPURINOL 100 MG PO TABS
100.0000 mg | ORAL_TABLET | Freq: Every day | ORAL | 0 refills | Status: DC
  Filled 2020-09-14: qty 90, 90d supply, fill #0

## 2020-10-22 ENCOUNTER — Other Ambulatory Visit: Payer: Self-pay | Admitting: Family Medicine

## 2020-10-22 ENCOUNTER — Other Ambulatory Visit (HOSPITAL_BASED_OUTPATIENT_CLINIC_OR_DEPARTMENT_OTHER): Payer: Self-pay

## 2020-10-22 MED ORDER — ATORVASTATIN CALCIUM 80 MG PO TABS
80.0000 mg | ORAL_TABLET | Freq: Every day | ORAL | 0 refills | Status: DC
  Filled 2020-10-22: qty 30, 30d supply, fill #0

## 2020-10-22 MED ORDER — NEBIVOLOL HCL 10 MG PO TABS
10.0000 mg | ORAL_TABLET | Freq: Every day | ORAL | 0 refills | Status: DC
  Filled 2020-10-22: qty 30, 30d supply, fill #0

## 2020-10-29 ENCOUNTER — Other Ambulatory Visit: Payer: Self-pay

## 2020-10-29 ENCOUNTER — Other Ambulatory Visit (HOSPITAL_BASED_OUTPATIENT_CLINIC_OR_DEPARTMENT_OTHER): Payer: Self-pay

## 2020-10-29 ENCOUNTER — Encounter: Payer: Self-pay | Admitting: Family Medicine

## 2020-10-29 ENCOUNTER — Ambulatory Visit: Payer: BC Managed Care – PPO | Admitting: Family Medicine

## 2020-10-29 VITALS — BP 124/80 | HR 59 | Temp 98.2°F | Resp 16 | Wt 308.4 lb

## 2020-10-29 DIAGNOSIS — E782 Mixed hyperlipidemia: Secondary | ICD-10-CM | POA: Diagnosis not present

## 2020-10-29 DIAGNOSIS — E669 Obesity, unspecified: Secondary | ICD-10-CM

## 2020-10-29 DIAGNOSIS — I10 Essential (primary) hypertension: Secondary | ICD-10-CM

## 2020-10-29 DIAGNOSIS — R252 Cramp and spasm: Secondary | ICD-10-CM

## 2020-10-29 DIAGNOSIS — M1000 Idiopathic gout, unspecified site: Secondary | ICD-10-CM | POA: Diagnosis not present

## 2020-10-29 DIAGNOSIS — R739 Hyperglycemia, unspecified: Secondary | ICD-10-CM | POA: Diagnosis not present

## 2020-10-29 DIAGNOSIS — Z23 Encounter for immunization: Secondary | ICD-10-CM | POA: Diagnosis not present

## 2020-10-29 DIAGNOSIS — E1169 Type 2 diabetes mellitus with other specified complication: Secondary | ICD-10-CM

## 2020-10-29 DIAGNOSIS — N433 Hydrocele, unspecified: Secondary | ICD-10-CM

## 2020-10-29 LAB — CBC WITH DIFFERENTIAL/PLATELET
Basophils Absolute: 0.1 10*3/uL (ref 0.0–0.1)
Basophils Relative: 0.8 % (ref 0.0–3.0)
Eosinophils Absolute: 0.2 10*3/uL (ref 0.0–0.7)
Eosinophils Relative: 2.3 % (ref 0.0–5.0)
HCT: 45.5 % (ref 39.0–52.0)
Hemoglobin: 15.3 g/dL (ref 13.0–17.0)
Lymphocytes Relative: 25.5 % (ref 12.0–46.0)
Lymphs Abs: 2.1 10*3/uL (ref 0.7–4.0)
MCHC: 33.7 g/dL (ref 30.0–36.0)
MCV: 87.7 fl (ref 78.0–100.0)
Monocytes Absolute: 0.6 10*3/uL (ref 0.1–1.0)
Monocytes Relative: 7.5 % (ref 3.0–12.0)
Neutro Abs: 5.4 10*3/uL (ref 1.4–7.7)
Neutrophils Relative %: 63.9 % (ref 43.0–77.0)
Platelets: 266 10*3/uL (ref 150.0–400.0)
RBC: 5.19 Mil/uL (ref 4.22–5.81)
RDW: 13.5 % (ref 11.5–15.5)
WBC: 8.4 10*3/uL (ref 4.0–10.5)

## 2020-10-29 LAB — COMPREHENSIVE METABOLIC PANEL
ALT: 35 U/L (ref 0–53)
AST: 27 U/L (ref 0–37)
Albumin: 4.3 g/dL (ref 3.5–5.2)
Alkaline Phosphatase: 62 U/L (ref 39–117)
BUN: 14 mg/dL (ref 6–23)
CO2: 29 mEq/L (ref 19–32)
Calcium: 9.7 mg/dL (ref 8.4–10.5)
Chloride: 102 mEq/L (ref 96–112)
Creatinine, Ser: 1.13 mg/dL (ref 0.40–1.50)
GFR: 71.84 mL/min (ref >60.00)
Glucose, Bld: 103 mg/dL — ABNORMAL HIGH (ref 70–99)
Potassium: 5 mEq/L (ref 3.5–5.1)
Sodium: 140 mEq/L (ref 135–145)
Total Bilirubin: 0.7 mg/dL (ref 0.2–1.2)
Total Protein: 6.8 g/dL (ref 6.0–8.3)

## 2020-10-29 LAB — TSH: TSH: 1.85 u[IU]/mL (ref 0.35–5.50)

## 2020-10-29 LAB — LIPID PANEL
Cholesterol: 113 mg/dL (ref 0–200)
HDL: 31.4 mg/dL — ABNORMAL LOW (ref >39.00)
LDL Cholesterol: 46 mg/dL (ref 0–99)
NonHDL: 81.36
Total CHOL/HDL Ratio: 4
Triglycerides: 175 mg/dL — ABNORMAL HIGH (ref 0.0–149.0)
VLDL: 35 mg/dL (ref 0.0–40.0)

## 2020-10-29 LAB — URIC ACID: Uric Acid, Serum: 7.3 mg/dL (ref 4.0–7.8)

## 2020-10-29 LAB — MAGNESIUM: Magnesium: 1.8 mg/dL (ref 1.5–2.5)

## 2020-10-29 LAB — HEMOGLOBIN A1C: Hgb A1c MFr Bld: 6.6 % — ABNORMAL HIGH (ref 4.6–6.5)

## 2020-10-29 MED ORDER — GABAPENTIN 300 MG PO CAPS: 300.0000 mg | ORAL_CAPSULE | Freq: Three times a day (TID) | ORAL | 1 refills | Status: DC

## 2020-10-29 MED ORDER — COLESEVELAM HCL 625 MG PO TABS
1875.0000 mg | ORAL_TABLET | Freq: Two times a day (BID) | ORAL | 0 refills | Status: DC
  Filled 2020-10-29: qty 180, 30d supply, fill #0

## 2020-10-29 MED ORDER — SILDENAFIL CITRATE 20 MG PO TABS: ORAL_TABLET | ORAL | 1 refills | Status: DC

## 2020-10-29 MED ORDER — LISINOPRIL 40 MG PO TABS
40.0000 mg | ORAL_TABLET | Freq: Every day | ORAL | 0 refills | Status: DC
  Filled 2020-10-29 – 2020-12-19 (×2): qty 90, 90d supply, fill #0

## 2020-10-29 NOTE — Assessment & Plan Note (Signed)
Hydrate and monitor 

## 2020-10-29 NOTE — Assessment & Plan Note (Addendum)
hgba1c acceptable but elevated, minimize simple carbs. Increase exercise as tolerated. His A1C has risen above 6.5 so is now officially diabetic.

## 2020-10-29 NOTE — Progress Notes (Signed)
Subjective:   By signing my name below, I, Zite Okoli, attest that this documentation has been prepared under the direction and in the presence of Mosie Lukes, MD. 10/29/2020    Patient ID: Barry Anthony, male    DOB: Aug 08, 1960, 60 y.o.   MRN: [id number]  Chief Complaint  Patient presents with   Follow-up    HPI Patient is in today for an office visit.  He reports having acid reflux and has reduced his alcohol intake and started eating earlier. He mentions these changes have not helped and the reflux is worse at night. Notes that high-carb meals make it worse. He has been using famotidine and is causing moderate relief. He is also trying to actively lose weight.  He also reports cramping in his thighs and ankles after a long day of work.  He also mentions going to see a urologist because he still has bilateral hydrocele and recurrent swelling in his testicles that has not resolved but is not worsening.   He has had no recent bouts of gout since he started taking 100 mg allopurinol and is doing well on it.  He is requesting a refill on 20 mg sildenafil and 300 mg gabapentin. He does not need a refill on tizanidine because his back pain has become less frequent.    He received the flu vaccine today and has received the latest Covid-19 booster vaccine.   Past Medical History:  Diagnosis Date   Allergic rhinitis    Allergy    Bilateral hydrocele 12/08/2012   GERD (gastroesophageal reflux disease)    History of chicken pox    Hyperglycemia 08/15/2008   Qualifier: Diagnosis of  By: Wynona Luna     Hyperlipidemia    Hypertension    Lipoma of shoulder    Preventative health care 02/12/2014   Testicular pain, left 08/04/2016    Past Surgical History:  Procedure Laterality Date   EYE SURGERY     HERNIA REPAIR     groin   KNEE SURGERY  1981   right knee   TONSILLECTOMY  1969   WISDOM TOOTH EXTRACTION      Family History  Problem Relation Age of Onset   Thyroid  cancer Father    Hypertension Father    Coronary artery disease Father    Hyperlipidemia Father    Alcohol abuse Father        smoker   Heart disease Father    Hypothyroidism Mother    Arthritis Mother    Dementia Maternal Aunt    Dementia Maternal Uncle    Cancer Maternal Grandmother        breast   Heart disease Paternal Grandmother        heart disease   Other Other        no colon cancer, no prostate canceer   Colon cancer Neg Hx     Social History   Socioeconomic History   Marital status: Divorced    Spouse name: Not on file   Number of children: Not on file   Years of education: Not on file   Highest education level: Not on file  Occupational History   Not on file  Tobacco Use   Smoking status: Never   Smokeless tobacco: Never  Vaping Use   Vaping Use: Never used  Substance and Sexual Activity   Alcohol use: Yes    Comment: 2 drinks / week   Drug use: No   Sexual  activity: Yes    Comment: lives with wife, works as a Public house manager at [employer]. no major dietary restrictions  Other Topics Concern   Not on file  Social History Narrative   Occupation:  Parts mgr   Married 16 yrs    2 children  46, 76    Never Smoked    Alcohol use-yes (social)    Social Determinants of Health   Financial Resource Strain: Not on file  Food Insecurity: Not on file  Transportation Needs: Not on file  Physical Activity: Not on file  Stress: Not on file  Social Connections: Not on file  Intimate Partner Violence: Not on file    Outpatient Medications Prior to Visit  Medication Sig Dispense Refill   nebivolol (BYSTOLIC) 10 MG tablet Take 1 tablet (10 mg total) by mouth daily. 30 tablet 0   sildenafil (REVATIO) 20 MG tablet TAKE ONE TO FIVE TABLETS BY MOUTH ONCE A DAY AS NEEDED 150 tablet 0   tiZANidine (ZANAFLEX) 4 MG tablet Take 0.5-1 tablets (2-4 mg total) by mouth every 8 (eight) hours as needed for muscle spasms. 30 tablet 1   albuterol (PROAIR HFA) 108 (90  Base) MCG/ACT inhaler INHALE 2 PUFFS INTO THE LUNGS DAILY AS NEEDED 8.5 g 3   allopurinol (ZYLOPRIM) 100 MG tablet Take 1 tablet (100 mg total) by mouth daily. 90 tablet 0   atorvastatin (LIPITOR) 80 MG tablet Take 1 tablet (80 mg total) by mouth daily. 30 tablet 0   fluticasone (FLONASE) 50 MCG/ACT nasal spray Place 2 sprays into both nostrils daily as needed. 48 g 3   budesonide-formoterol (SYMBICORT) 80-4.5 MCG/ACT inhaler Inhale 2 puffs into the lungs 2 (two) times daily. 1 Inhaler 3   colesevelam (WELCHOL) 625 MG tablet Take 3 tablets (1,875 mg total) by mouth 2 (two) times daily with a meal. 180 tablet 0   gabapentin (NEURONTIN) 300 MG capsule Take 1 capsule (300 mg total) by mouth 3 (three) times daily. 90 capsule 1   gabapentin (NEURONTIN) 300 MG capsule TAKE 1 CAPSULE BY MOUTH 3 TIMES A DAY 90 capsule 0   lisinopril (ZESTRIL) 40 MG tablet Take 1 tablet (40 mg total) by mouth daily. 90 tablet 0   No facility-administered medications prior to visit.    No Known Allergies  Review of Systems  Constitutional:  Negative for fever and malaise/fatigue.  HENT:  Negative for congestion.   Eyes:  Negative for redness.  Respiratory:  Negative for shortness of breath.   Cardiovascular:  Negative for chest pain, palpitations and leg swelling.  Gastrointestinal:  Negative for abdominal pain, blood in stool and nausea.       (+) acid reflux  Genitourinary:  Negative for dysuria and frequency.  Musculoskeletal:  Negative for falls.       (+) leg cramps  Skin:  Negative for rash.  Neurological:  Negative for dizziness, loss of consciousness and headaches.  Endo/Heme/Allergies:  Negative for polydipsia.  Psychiatric/Behavioral:  Negative for depression. The patient is not nervous/anxious.       Objective:    Physical Exam Constitutional:      Appearance: Normal appearance. He is not ill-appearing.  HENT:     Head: Normocephalic and atraumatic.     Right Ear: Tympanic membrane, ear canal  and external ear normal.     Left Ear: Tympanic membrane, ear canal and external ear normal.  Eyes:     Conjunctiva/sclera: Conjunctivae normal.  Cardiovascular:     Rate and  Rhythm: Normal rate and regular rhythm.     Heart sounds: Normal heart sounds. No murmur heard. Pulmonary:     Breath sounds: Normal breath sounds. No wheezing.  Abdominal:     General: Bowel sounds are normal. There is no distension.     Palpations: Abdomen is soft.     Tenderness: There is no abdominal tenderness.     Hernia: No hernia is present.  Genitourinary:    Testes:        Right: Testicular hydrocele present.        Left: Testicular hydrocele present.  Musculoskeletal:     Cervical back: Neck supple.  Lymphadenopathy:     Cervical: No cervical adenopathy.  Skin:    General: Skin is warm and dry.  Neurological:     Mental Status: He is alert and oriented to person, place, and time.  Psychiatric:        Behavior: Behavior normal.    BP 124/80   Pulse (!) 59   Temp 98.2 F (36.8 C)   Resp 16   Wt (!) 308 lb 6.4 oz (139.9 kg)   SpO2 96%   BMI 44.25 kg/m  Wt Readings from Last 3 Encounters:  10/29/20 (!) 308 lb 6.4 oz (139.9 kg)  11/01/19 298 lb 3.2 oz (135.3 kg)  04/28/19 (!) 309 lb (140.2 kg)    Diabetic Foot Exam - Simple   No data filed    Lab Results  Component Value Date   WBC 8.4 10/29/2020   HGB 15.3 10/29/2020   HCT 45.5 10/29/2020   PLT 266.0 10/29/2020   GLUCOSE 103 (H) 10/29/2020   CHOL 113 10/29/2020   TRIG 175.0 (H) 10/29/2020   HDL 31.40 (L) 10/29/2020   LDLDIRECT 74.0 04/28/2019   LDLCALC 46 10/29/2020   ALT 35 10/29/2020   AST 27 10/29/2020   NA 140 10/29/2020   K 5.0 10/29/2020   CL 102 10/29/2020   CREATININE 1.13 10/29/2020   BUN 14 10/29/2020   CO2 29 10/29/2020   TSH 1.85 10/29/2020   PSA 1.28 11/01/2019   HGBA1C 6.6 (H) 10/29/2020    Lab Results  Component Value Date   TSH 1.85 10/29/2020   Lab Results  Component Value Date   WBC 8.4  10/29/2020   HGB 15.3 10/29/2020   HCT 45.5 10/29/2020   MCV 87.7 10/29/2020   PLT 266.0 10/29/2020   Lab Results  Component Value Date   NA 140 10/29/2020   K 5.0 10/29/2020   CO2 29 10/29/2020   GLUCOSE 103 (H) 10/29/2020   BUN 14 10/29/2020   CREATININE 1.13 10/29/2020   BILITOT 0.7 10/29/2020   ALKPHOS 62 10/29/2020   AST 27 10/29/2020   ALT 35 10/29/2020   PROT 6.8 10/29/2020   ALBUMIN 4.3 10/29/2020   CALCIUM 9.7 10/29/2020   GFR 71.84 10/29/2020   Lab Results  Component Value Date   CHOL 113 10/29/2020   Lab Results  Component Value Date   HDL 31.40 (L) 10/29/2020   Lab Results  Component Value Date   LDLCALC 46 10/29/2020   Lab Results  Component Value Date   TRIG 175.0 (H) 10/29/2020   Lab Results  Component Value Date   CHOLHDL 4 10/29/2020   Lab Results  Component Value Date   HGBA1C 6.6 (H) 10/29/2020       Assessment & Plan:   Problem List Items Addressed This Visit     Hyperlipidemia, mixed    Encourage heart healthy  diet such as MIND or DASH diet, increase exercise, avoid trans fats, simple carbohydrates and processed foods, consider a krill or fish or flaxseed oil cap daily. Tolerating Atorvastatin      Relevant Medications   sildenafil (REVATIO) 20 MG tablet   lisinopril (ZESTRIL) 40 MG tablet   colesevelam (WELCHOL) 625 MG tablet   Other Relevant Orders   CBC with Differential/Platelet (Completed)   Comprehensive metabolic panel (Completed)   TSH (Completed)   Lipid panel (Completed)   Obesity    Encouraged DASH or MIND diet, decrease po intake and increase exercise as tolerated. Needs 7-8 hours of sleep nightly. Avoid trans fats, eat small, frequent meals every 4-5 hours with lean proteins, complex carbs and healthy fats. Minimize simple carbs, high fat foods and processed foods. Consider [phone] or Saxenda      Essential hypertension    Well controlled, no changes to meds. Encouraged heart healthy diet such as the DASH diet and  exercise as tolerated.       Relevant Medications   sildenafil (REVATIO) 20 MG tablet   lisinopril (ZESTRIL) 40 MG tablet   colesevelam (WELCHOL) 625 MG tablet   Other Relevant Orders   CBC with Differential/Platelet (Completed)   Comprehensive metabolic panel (Completed)   TSH (Completed)   Lipid panel (Completed)   Type 2 diabetes mellitus with morbid obesity ([place]) - Primary    hgba1c acceptable but elevated, minimize simple carbs. Increase exercise as tolerated. His A1C has risen above 6.5 so is now officially diabetic.       Relevant Medications   lisinopril (ZESTRIL) 40 MG tablet   Gout    Hydrate and monitor      Relevant Orders   Uric acid (Completed)   Hydrocele, bilateral    Enlarging and symptomatic he is ready to proceed with correction by next year.       Relevant Orders   Ambulatory referral to Urology   Muscle cramp    Hydrate and monitor      Other Visit Diagnoses     Muscle cramps       Relevant Orders   Magnesium (Completed)   CK       Meds ordered this encounter  Medications   sildenafil (REVATIO) 20 MG tablet    Sig: TAKE ONE TO FIVE TABLETS BY MOUTH ONCE A DAY AS NEEDED    Dispense:  150 tablet    Refill:  1    NEEDS OFFICE VISIT BEFORE ANY FURTHER REFILLS   gabapentin (NEURONTIN) 300 MG capsule    Sig: Take 1 capsule (300 mg total) by mouth 3 (three) times daily.    Dispense:  90 capsule    Refill:  1   lisinopril (ZESTRIL) 40 MG tablet    Sig: Take 1 tablet (40 mg total) by mouth daily.    Dispense:  90 tablet    Refill:  0    NEED OFFICE VISIT BEFORE ANY FURTHER REFILLS   colesevelam (WELCHOL) 625 MG tablet    Sig: Take 3 tablets (1,875 mg total) by mouth 2 (two) times daily with a meal.    Dispense:  180 tablet    Refill:  0    NEEDS OFFICE VISIT BEFORE ANY FURTHER REFILLS    I,Zite Okoli,acting as a scribe for Penni Homans, MD.,have documented all relevant documentation on the behalf of Penni Homans, MD,as directed by   Penni Homans, MD while in the presence of Penni Homans, MD.   I, Mosie Lukes,  MD., personally preformed the services described in this documentation.  All medical record entries made by the scribe were at my direction and in my presence.  I have reviewed the chart and discharge instructions (if applicable) and agree that the record reflects my personal performance and is accurate and complete. 10/29/2020

## 2020-10-29 NOTE — Assessment & Plan Note (Signed)
Encouraged DASH or MIND diet, decrease po intake and increase exercise as tolerated. Needs 7-8 hours of sleep nightly. Avoid trans fats, eat small, frequent meals every 4-5 hours with lean proteins, complex carbs and healthy fats. Minimize simple carbs, high fat foods and processed foods. Consider Wegovy or Saxenda °

## 2020-10-29 NOTE — Assessment & Plan Note (Addendum)
Enlarging and symptomatic he is ready to proceed with correction by next year.

## 2020-10-29 NOTE — Assessment & Plan Note (Signed)
Encourage heart healthy diet such as MIND or DASH diet, increase exercise, avoid trans fats, simple carbohydrates and processed foods, consider a krill or fish or flaxseed oil cap daily. Tolerating Atorvastatin 

## 2020-10-29 NOTE — Assessment & Plan Note (Signed)
Well controlled, no changes to meds. Encouraged heart healthy diet such as the DASH diet and exercise as tolerated.  °

## 2020-10-29 NOTE — Patient Instructions (Addendum)
Wegovy or Saxenda are the injectables for weight loss, check with insurance regarding coverage and let us know  Anthony is expensive but works, [employer] or [employer]   Cramps are often dehydration, need minimum of 60-80 ounces for clear fluids daily   Paxlovid and Molnupiravir are the new COVID medication we can give you if you get COVID so make sure you test if you have symptoms because we have to treat by day 5 of symptoms for it to be effective. If you are positive let us know so we can treat. If a home test is negative and your symptoms are persistent get a PCR test. Can check testing locations at [email or web address] If you are positive we will make an appointment with [place] and we will send in Paxlovid or Molnupiravir if you would like it. Check with your pharmacy before we meet to confirm they have it in stock, if they do not then we can get the prescription at the [facility]

## 2020-11-13 ENCOUNTER — Other Ambulatory Visit (HOSPITAL_COMMUNITY): Payer: Self-pay

## 2020-11-20 ENCOUNTER — Other Ambulatory Visit (HOSPITAL_BASED_OUTPATIENT_CLINIC_OR_DEPARTMENT_OTHER): Payer: Self-pay

## 2020-11-20 ENCOUNTER — Other Ambulatory Visit: Payer: Self-pay | Admitting: Family Medicine

## 2020-11-20 MED ORDER — ATORVASTATIN CALCIUM 80 MG PO TABS
80.0000 mg | ORAL_TABLET | Freq: Every day | ORAL | 0 refills | Status: DC
  Filled 2020-11-20: qty 30, 30d supply, fill #0

## 2020-11-20 MED ORDER — NEBIVOLOL HCL 10 MG PO TABS
10.0000 mg | ORAL_TABLET | Freq: Every day | ORAL | 0 refills | Status: DC
  Filled 2020-11-20: qty 30, 30d supply, fill #0

## 2020-11-21 ENCOUNTER — Other Ambulatory Visit (HOSPITAL_BASED_OUTPATIENT_CLINIC_OR_DEPARTMENT_OTHER): Payer: Self-pay

## 2020-12-05 ENCOUNTER — Other Ambulatory Visit (HOSPITAL_BASED_OUTPATIENT_CLINIC_OR_DEPARTMENT_OTHER): Payer: Self-pay

## 2020-12-19 ENCOUNTER — Other Ambulatory Visit (HOSPITAL_BASED_OUTPATIENT_CLINIC_OR_DEPARTMENT_OTHER): Payer: Self-pay

## 2020-12-19 ENCOUNTER — Other Ambulatory Visit: Payer: Self-pay | Admitting: Family Medicine

## 2020-12-19 MED ORDER — ALLOPURINOL 100 MG PO TABS
100.0000 mg | ORAL_TABLET | Freq: Every day | ORAL | 0 refills | Status: DC
  Filled 2020-12-19: qty 90, 90d supply, fill #0

## 2020-12-19 MED ORDER — ATORVASTATIN CALCIUM 80 MG PO TABS
80.0000 mg | ORAL_TABLET | Freq: Every day | ORAL | 0 refills | Status: DC
  Filled 2020-12-19: qty 30, 30d supply, fill #0

## 2020-12-19 MED ORDER — NEBIVOLOL HCL 10 MG PO TABS
10.0000 mg | ORAL_TABLET | Freq: Every day | ORAL | 0 refills | Status: DC
  Filled 2020-12-19: qty 30, 30d supply, fill #0

## 2021-01-28 ENCOUNTER — Other Ambulatory Visit: Payer: Self-pay | Admitting: Family Medicine

## 2021-01-28 ENCOUNTER — Other Ambulatory Visit (HOSPITAL_BASED_OUTPATIENT_CLINIC_OR_DEPARTMENT_OTHER): Payer: Self-pay

## 2021-01-28 MED ORDER — NEBIVOLOL HCL 10 MG PO TABS
10.0000 mg | ORAL_TABLET | Freq: Every day | ORAL | 0 refills | Status: DC
  Filled 2021-01-28: qty 30, 30d supply, fill #0

## 2021-01-28 MED ORDER — ATORVASTATIN CALCIUM 80 MG PO TABS
80.0000 mg | ORAL_TABLET | Freq: Every day | ORAL | 0 refills | Status: DC
  Filled 2021-01-28: qty 30, 30d supply, fill #0

## 2021-01-28 MED ORDER — COLESEVELAM HCL 625 MG PO TABS
1875.0000 mg | ORAL_TABLET | Freq: Two times a day (BID) | ORAL | 0 refills | Status: DC
  Filled 2021-01-28: qty 60, 10d supply, fill #0

## 2021-01-29 ENCOUNTER — Ambulatory Visit: Payer: BC Managed Care – PPO | Admitting: Family

## 2021-01-29 ENCOUNTER — Other Ambulatory Visit (HOSPITAL_BASED_OUTPATIENT_CLINIC_OR_DEPARTMENT_OTHER): Payer: Self-pay

## 2021-01-29 DIAGNOSIS — E1169 Type 2 diabetes mellitus with other specified complication: Secondary | ICD-10-CM

## 2021-01-29 DIAGNOSIS — I10 Essential (primary) hypertension: Secondary | ICD-10-CM

## 2021-01-29 DIAGNOSIS — E782 Mixed hyperlipidemia: Secondary | ICD-10-CM | POA: Diagnosis not present

## 2021-01-29 LAB — BASIC METABOLIC PANEL
BUN: 18 mg/dL (ref 6–23)
CO2: 29 mEq/L (ref 19–32)
Calcium: 10 mg/dL (ref 8.4–10.5)
Chloride: 102 mEq/L (ref 96–112)
Creatinine, Ser: 1.21 mg/dL (ref 0.40–1.50)
GFR: 66.06 mL/min (ref >60.00)
Glucose, Bld: 107 mg/dL — ABNORMAL HIGH (ref 70–99)
Potassium: 4.9 mEq/L (ref 3.5–5.1)
Sodium: 139 mEq/L (ref 135–145)

## 2021-01-29 LAB — HEMOGLOBIN A1C: Hgb A1c MFr Bld: 6 % (ref 4.6–6.5)

## 2021-01-29 MED ORDER — ALLOPURINOL 100 MG PO TABS
100.0000 mg | ORAL_TABLET | Freq: Every day | ORAL | 1 refills | Status: DC
  Filled 2021-01-29 – 2021-03-25 (×2): qty 90, 90d supply, fill #0
  Filled 2021-06-20: qty 90, 90d supply, fill #1

## 2021-01-29 MED ORDER — COLESEVELAM HCL 625 MG PO TABS
1875.0000 mg | ORAL_TABLET | Freq: Two times a day (BID) | ORAL | 1 refills | Status: DC
  Filled 2021-01-29 – 2021-02-25 (×2): qty 540, 90d supply, fill #0
  Filled 2021-09-05: qty 540, 90d supply, fill #1

## 2021-01-29 MED ORDER — ATORVASTATIN CALCIUM 80 MG PO TABS
80.0000 mg | ORAL_TABLET | Freq: Every day | ORAL | 1 refills | Status: DC
  Filled 2021-01-29 – 2021-02-25 (×2): qty 90, 90d supply, fill #0
  Filled 2021-05-28: qty 90, 90d supply, fill #1

## 2021-01-29 MED ORDER — NEBIVOLOL HCL 10 MG PO TABS
10.0000 mg | ORAL_TABLET | Freq: Every day | ORAL | 1 refills | Status: DC
  Filled 2021-01-29 – 2021-02-25 (×2): qty 90, 90d supply, fill #0
  Filled 2021-05-28: qty 90, 90d supply, fill #1

## 2021-01-29 MED ORDER — SILDENAFIL CITRATE 20 MG PO TABS: ORAL_TABLET | ORAL | 1 refills | Status: DC

## 2021-01-29 MED ORDER — LISINOPRIL 40 MG PO TABS
40.0000 mg | ORAL_TABLET | Freq: Every day | ORAL | 1 refills | Status: DC
  Filled 2021-01-29 – 2021-03-25 (×2): qty 90, 90d supply, fill #0
  Filled 2021-06-20: qty 90, 90d supply, fill #1

## 2021-01-29 NOTE — Assessment & Plan Note (Signed)
LDL at goal. Continue atorvastatin and welchol.

## 2021-01-29 NOTE — Assessment & Plan Note (Signed)
Stable. Continue current doses of lisinopril and bystolic.

## 2021-01-29 NOTE — Progress Notes (Signed)
Subjective:     Patient ID: Barry Anthony, male    DOB: 09-09-1960, 61 y.o.   MRN: [id number]  Chief Complaint  Patient presents with   Hypertension    Here for follow up   Hyperlipidemia    Here for follow up    HPI Patient is in today for follow up.  HTN- on lisinopril/bystolic BP Readings from Last 3 Encounters:  01/29/21 137/73  10/29/20 124/80  11/01/19 122/68   Hyperlipidemia- on atorvastatin and welchol. He is working hard on diet and weight loss.   Wt Readings from Last 3 Encounters:  01/29/21 295 lb (133.8 kg)  10/29/20 (!) 308 lb 6.4 oz (139.9 kg)  11/01/19 298 lb 3.2 oz (135.3 kg)    Lab Results  Component Value Date   CHOL 113 10/29/2020   HDL 31.40 (L) 10/29/2020   LDLCALC 46 10/29/2020   LDLDIRECT 74.0 04/28/2019   TRIG 175.0 (H) 10/29/2020   CHOLHDL 4 10/29/2020   DM2-  Lab Results  Component Value Date   HGBA1C 6.6 (H) 10/29/2020   HGBA1C 6.1 (H) 11/01/2019   HGBA1C 6.3 04/28/2019   Lab Results  Component Value Date   LDLCALC 46 10/29/2020   CREATININE 1.13 10/29/2020      There are no preventive care reminders to display for this patient.   Past Medical History:  Diagnosis Date   Allergic rhinitis    Allergy    Bilateral hydrocele 12/08/2012   GERD (gastroesophageal reflux disease)    History of chicken pox    Hyperglycemia 08/15/2008   Qualifier: Diagnosis of  By: Wynona Luna     Hyperlipidemia    Hypertension    Lipoma of shoulder    Preventative health care 02/12/2014   Testicular pain, left 08/04/2016    Past Surgical History:  Procedure Laterality Date   EYE SURGERY     HERNIA REPAIR     groin   KNEE SURGERY  1981   right knee   TONSILLECTOMY  1969   WISDOM TOOTH EXTRACTION      Family History  Problem Relation Age of Onset   Thyroid cancer Father    Hypertension Father    Coronary artery disease Father    Hyperlipidemia Father    Alcohol abuse Father        smoker   Heart disease Father     Hypothyroidism Mother    Arthritis Mother    Dementia Maternal Aunt    Dementia Maternal Uncle    Cancer Maternal Grandmother        breast   Heart disease Paternal Grandmother        heart disease   Other Other        no colon cancer, no prostate canceer   Colon cancer Neg Hx     Social History   Socioeconomic History   Marital status: Divorced    Spouse name: Not on file   Number of children: Not on file   Years of education: Not on file   Highest education level: Not on file  Occupational History   Not on file  Tobacco Use   Smoking status: Never   Smokeless tobacco: Never  Vaping Use   Vaping Use: Never used  Substance and Sexual Activity   Alcohol use: Yes    Comment: 2 drinks / week   Drug use: No   Sexual activity: Yes    Comment: lives with wife, works as a Public house manager at  [employer]. no major dietary restrictions  Other Topics Concern   Not on file  Social History Narrative   Occupation:  Parts mgr   Married 16 yrs    2 children  18, 12    Never Smoked    Alcohol use-yes (social)    Social Determinants of Health   Financial Resource Strain: Not on file  Food Insecurity: Not on file  Transportation Needs: Not on file  Physical Activity: Not on file  Stress: Not on file  Social Connections: Not on file  Intimate Partner Violence: Not on file    Outpatient Medications Prior to Visit  Medication Sig Dispense Refill   albuterol (PROAIR HFA) 108 (90 Base) MCG/ACT inhaler INHALE 2 PUFFS INTO THE LUNGS DAILY AS NEEDED 8.5 g 3   fluticasone (FLONASE) 50 MCG/ACT nasal spray Place 2 sprays into both nostrils daily as needed. 48 g 3   gabapentin (NEURONTIN) 300 MG capsule Take 1 capsule (300 mg total) by mouth 3 (three) times daily. 90 capsule 1   allopurinol (ZYLOPRIM) 100 MG tablet Take 1 tablet (100 mg total) by mouth daily. *Need office visit before further refills* 90 tablet 0   atorvastatin (LIPITOR) 80 MG tablet Take 1 tablet (80 mg total) by  mouth daily. 30 tablet 0   colesevelam (WELCHOL) 625 MG tablet Take 3 tablets (1,875 mg total) by mouth 2 (two) times daily with a meal. 60 tablet 0   lisinopril (ZESTRIL) 40 MG tablet Take 1 tablet (40 mg total) by mouth daily. 90 tablet 0   nebivolol (BYSTOLIC) 10 MG tablet Take 1 tablet (10 mg total) by mouth daily. 30 tablet 0   sildenafil (REVATIO) 20 MG tablet TAKE ONE TO FIVE TABLETS BY MOUTH ONCE A DAY AS NEEDED 150 tablet 1   No facility-administered medications prior to visit.    No Known Allergies  ROS    See HPI  Objective:    Physical Exam Constitutional:      General: He is not in acute distress.    Appearance: He is well-developed.  HENT:     Head: Normocephalic and atraumatic.  Cardiovascular:     Rate and Rhythm: Normal rate and regular rhythm.     Heart sounds: No murmur heard. Pulmonary:     Effort: Pulmonary effort is normal. No respiratory distress.     Breath sounds: Normal breath sounds. No wheezing or rales.  Skin:    General: Skin is warm and dry.  Neurological:     Mental Status: He is alert and oriented to person, place, and time.  Psychiatric:        Behavior: Behavior normal.        Thought Content: Thought content normal.    BP 137/73 (BP Location: Right Arm, Patient Position: Sitting, Cuff Size: Large)    Pulse 61    Temp 98.1 F (36.7 C) (Oral)    Resp 16    Wt 295 lb (133.8 kg)    SpO2 98%    BMI 42.33 kg/m  Wt Readings from Last 3 Encounters:  01/29/21 295 lb (133.8 kg)  10/29/20 (!) 308 lb 6.4 oz (139.9 kg)  11/01/19 298 lb 3.2 oz (135.3 kg)       Assessment & Plan:   Problem List Items Addressed This Visit       Unprioritized   Type 2 diabetes mellitus with morbid obesity ([place]) - Primary    Last A1C 6.6.  Pt is working hard on diet and  exercise and has lost 13 pounds. Encouraged him to keep up the good work.       Relevant Medications   atorvastatin (LIPITOR) 80 MG tablet   lisinopril (ZESTRIL) 40 MG tablet   Other  Relevant Orders   Hemoglobin Y8A   Basic metabolic panel   Hyperlipidemia, mixed    LDL at goal. Continue atorvastatin and welchol.       Relevant Medications   atorvastatin (LIPITOR) 80 MG tablet   lisinopril (ZESTRIL) 40 MG tablet   nebivolol (BYSTOLIC) 10 MG tablet   colesevelam (WELCHOL) 625 MG tablet   sildenafil (REVATIO) 20 MG tablet   Essential hypertension    Stable. Continue current doses of lisinopril and bystolic.       Relevant Medications   atorvastatin (LIPITOR) 80 MG tablet   lisinopril (ZESTRIL) 40 MG tablet   nebivolol (BYSTOLIC) 10 MG tablet   colesevelam (WELCHOL) 625 MG tablet   sildenafil (REVATIO) 20 MG tablet    I am having [name] maintain his fluticasone, albuterol, gabapentin, allopurinol, atorvastatin, lisinopril, nebivolol, colesevelam, and sildenafil.  Meds ordered this encounter  Medications   allopurinol (ZYLOPRIM) 100 MG tablet    Sig: Take 1 tablet (100 mg total) by mouth daily. *Need office visit before further refills*    Dispense:  90 tablet    Refill:  1   atorvastatin (LIPITOR) 80 MG tablet    Sig: Take 1 tablet (80 mg total) by mouth daily.    Dispense:  90 tablet    Refill:  1   lisinopril (ZESTRIL) 40 MG tablet    Sig: Take 1 tablet (40 mg total) by mouth daily.    Dispense:  90 tablet    Refill:  1   nebivolol (BYSTOLIC) 10 MG tablet    Sig: Take 1 tablet (10 mg total) by mouth daily.    Dispense:  90 tablet    Refill:  1   colesevelam (WELCHOL) 625 MG tablet    Sig: Take 3 tablets (1,875 mg total) by mouth 2 (two) times daily with a meal.    Dispense:  540 tablet    Refill:  1   sildenafil (REVATIO) 20 MG tablet    Sig: TAKE ONE TO FIVE TABLETS BY MOUTH ONCE A DAY AS NEEDED    Dispense:  150 tablet    Refill:  1    Order Specific Question:   Supervising Provider    Answer:   Penni Homans A [[id number]]

## 2021-01-29 NOTE — Patient Instructions (Signed)
Please complete lab work prior to leaving.   

## 2021-01-29 NOTE — Assessment & Plan Note (Signed)
Last A1C 6.6.  Pt is working hard on diet and exercise and has lost 13 pounds. Encouraged him to keep up the good work.

## 2021-01-31 DIAGNOSIS — N400 Enlarged prostate without lower urinary tract symptoms: Secondary | ICD-10-CM | POA: Diagnosis not present

## 2021-01-31 DIAGNOSIS — Z125 Encounter for screening for malignant neoplasm of prostate: Secondary | ICD-10-CM | POA: Diagnosis not present

## 2021-01-31 DIAGNOSIS — N5201 Erectile dysfunction due to arterial insufficiency: Secondary | ICD-10-CM | POA: Diagnosis not present

## 2021-01-31 DIAGNOSIS — N430 Encysted hydrocele: Secondary | ICD-10-CM | POA: Diagnosis not present

## 2021-02-25 ENCOUNTER — Other Ambulatory Visit (HOSPITAL_BASED_OUTPATIENT_CLINIC_OR_DEPARTMENT_OTHER): Payer: Self-pay

## 2021-02-26 ENCOUNTER — Other Ambulatory Visit (HOSPITAL_BASED_OUTPATIENT_CLINIC_OR_DEPARTMENT_OTHER): Payer: Self-pay

## 2021-03-08 ENCOUNTER — Other Ambulatory Visit (HOSPITAL_BASED_OUTPATIENT_CLINIC_OR_DEPARTMENT_OTHER): Payer: Self-pay

## 2021-03-25 ENCOUNTER — Other Ambulatory Visit (HOSPITAL_BASED_OUTPATIENT_CLINIC_OR_DEPARTMENT_OTHER): Payer: Self-pay

## 2021-05-03 ENCOUNTER — Other Ambulatory Visit (HOSPITAL_BASED_OUTPATIENT_CLINIC_OR_DEPARTMENT_OTHER): Payer: Self-pay

## 2021-05-28 ENCOUNTER — Other Ambulatory Visit (HOSPITAL_BASED_OUTPATIENT_CLINIC_OR_DEPARTMENT_OTHER): Payer: Self-pay

## 2021-06-20 ENCOUNTER — Other Ambulatory Visit (HOSPITAL_BASED_OUTPATIENT_CLINIC_OR_DEPARTMENT_OTHER): Payer: Self-pay

## 2021-09-04 ENCOUNTER — Other Ambulatory Visit (HOSPITAL_BASED_OUTPATIENT_CLINIC_OR_DEPARTMENT_OTHER): Payer: Self-pay

## 2021-09-05 ENCOUNTER — Other Ambulatory Visit (HOSPITAL_BASED_OUTPATIENT_CLINIC_OR_DEPARTMENT_OTHER): Payer: Self-pay

## 2021-09-05 ENCOUNTER — Other Ambulatory Visit: Payer: Self-pay | Admitting: Family

## 2021-09-05 MED ORDER — NEBIVOLOL HCL 10 MG PO TABS
10.0000 mg | ORAL_TABLET | Freq: Every day | ORAL | 0 refills | Status: DC
  Filled 2021-09-05: qty 30, 30d supply, fill #0

## 2021-09-05 MED ORDER — ATORVASTATIN CALCIUM 80 MG PO TABS
80.0000 mg | ORAL_TABLET | Freq: Every day | ORAL | 0 refills | Status: DC
  Filled 2021-09-05: qty 30, 30d supply, fill #0

## 2021-09-06 ENCOUNTER — Other Ambulatory Visit (HOSPITAL_BASED_OUTPATIENT_CLINIC_OR_DEPARTMENT_OTHER): Payer: Self-pay

## 2021-09-24 DIAGNOSIS — H52223 Regular astigmatism, bilateral: Secondary | ICD-10-CM | POA: Diagnosis not present

## 2021-09-24 DIAGNOSIS — H40013 Open angle with borderline findings, low risk, bilateral: Secondary | ICD-10-CM | POA: Diagnosis not present

## 2021-09-24 DIAGNOSIS — H5213 Myopia, bilateral: Secondary | ICD-10-CM | POA: Diagnosis not present

## 2021-09-24 DIAGNOSIS — H2513 Age-related nuclear cataract, bilateral: Secondary | ICD-10-CM | POA: Diagnosis not present

## 2021-09-24 DIAGNOSIS — H524 Presbyopia: Secondary | ICD-10-CM | POA: Diagnosis not present

## 2021-10-03 ENCOUNTER — Other Ambulatory Visit (HOSPITAL_BASED_OUTPATIENT_CLINIC_OR_DEPARTMENT_OTHER): Payer: Self-pay

## 2021-10-03 ENCOUNTER — Other Ambulatory Visit: Payer: Self-pay | Admitting: Family

## 2021-10-03 ENCOUNTER — Other Ambulatory Visit: Payer: Self-pay | Admitting: Family Medicine

## 2021-10-03 MED ORDER — ATORVASTATIN CALCIUM 80 MG PO TABS
80.0000 mg | ORAL_TABLET | Freq: Every day | ORAL | 0 refills | Status: DC
  Filled 2021-10-03: qty 30, 30d supply, fill #0

## 2021-10-03 MED ORDER — ALLOPURINOL 100 MG PO TABS
100.0000 mg | ORAL_TABLET | Freq: Every day | ORAL | 0 refills | Status: DC
  Filled 2021-10-03: qty 30, 30d supply, fill #0

## 2021-10-03 MED ORDER — NEBIVOLOL HCL 10 MG PO TABS
10.0000 mg | ORAL_TABLET | Freq: Every day | ORAL | 0 refills | Status: DC
  Filled 2021-10-03: qty 30, 30d supply, fill #0

## 2021-10-03 MED ORDER — LISINOPRIL 40 MG PO TABS
40.0000 mg | ORAL_TABLET | Freq: Every day | ORAL | 0 refills | Status: DC
  Filled 2021-10-03: qty 30, 30d supply, fill #0

## 2021-10-03 NOTE — Progress Notes (Signed)
Subjective:   By signing my name below, I, Kellie Simmering, attest that this documentation has been prepared under the direction and in the presence of Mosie Lukes, MD 10/08/2021.     Patient ID: Barry Anthony, male    DOB: 21-Oct-1960, 61 y.o.   MRN: [id number]  Chief Complaint  Patient presents with   Follow-up    Here for follow up   HPI Patient is in today for an office visit and reports that he is doing well.  He denies having any fever, chills, ear pain, headaches, muscle pain, joint pain, new moles, rash, itching, congestion, sinus pain, sore throat, chest pain, palpitations, wheezing, nausea, vomitting, abdominal pain, diarrhea, constipation, blood in stool, dysuria, urgency, frequency and hematuria.  Activity: He reports that he has recently been very active and was experiencing cramps due to this. However, he was able to alleviate this by reducing the intensity of his activities and remaining hydrated.   Allergies: He states that he uses Albuterol as needed to manage shortness of breath caused by his allergies.   COVID-19: He is inquiring whether or not blood work can be performed to see if he has had COVID-19 in the past.  Diabetes: He is in inquiring whether or not he is classified as a diabetic. Lab Results  Component Value Date   HGBA1C 6.0 01/29/2021   Gout: He reports that Allopurinol 100 mg has been effective at managing his gout.   Immunizations: He has been informed about receiving COVID-19 and Pneumonia immunizations. He received the Flu immunization today. He is up to date on Shingles and Tetanus immunizations.   Refills: He is requesting refills on Albuterol, Allopurinol 100 mg, Atorvastatin 80 mg, Colesevelam 625 mg, Fluticasone, Lisinopril 40 mg, Nebivolol 10 mg, and Sildenafil 20 mg.   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   Past Medical History:  Diagnosis  Date   Allergic rhinitis    Allergy    Bilateral hydrocele 12/08/2012   GERD (gastroesophageal reflux disease)    History of chicken pox    Hyperglycemia 08/15/2008   Qualifier: Diagnosis of  By: Wynona Luna     Hyperlipidemia    Hypertension    Lipoma of shoulder    Preventative health care 02/12/2014   Testicular pain, left 08/04/2016   Past Surgical History:  Procedure Laterality Date   EYE SURGERY     HERNIA REPAIR     groin   KNEE SURGERY  1981   right knee   TONSILLECTOMY  1969   WISDOM TOOTH EXTRACTION     Family History  Problem Relation Age of Onset   Thyroid cancer Father    Hypertension Father    Coronary artery disease Father    Hyperlipidemia Father    Alcohol abuse Father        smoker   Heart disease Father    Hypothyroidism Mother    Arthritis Mother    Dementia Maternal Aunt    Dementia Maternal Uncle    Cancer Maternal Grandmother        breast   Heart disease Paternal Grandmother        heart disease   Other Other        no colon cancer, no prostate canceer   Colon cancer Neg Hx    Social History   Socioeconomic History   Marital status: Divorced  Spouse name: Not on file   Number of children: Not on file   Years of education: Not on file   Highest education level: Not on file  Occupational History   Not on file  Tobacco Use   Smoking status: Never   Smokeless tobacco: Never  Vaping Use   Vaping Use: Never used  Substance and Sexual Activity   Alcohol use: Yes    Comment: 2 drinks / week   Drug use: No   Sexual activity: Yes    Comment: lives with wife, works as a Public house manager at [employer]. no major dietary restrictions  Other Topics Concern   Not on file  Social History Narrative   Occupation:  Parts mgr   Married 16 yrs    2 children  54, 44    Never Smoked    Alcohol use-yes (social)    Social Determinants of Health   Financial Resource Strain: Not on file  Food Insecurity: Not on file  Transportation  Needs: Not on file  Physical Activity: Not on file  Stress: Not on file  Social Connections: Not on file  Intimate Partner Violence: Not on file   Outpatient Medications Prior to Visit  Medication Sig Dispense Refill   fluticasone (FLONASE) 50 MCG/ACT nasal spray Place 2 sprays into both nostrils daily as needed. 48 g 3   gabapentin (NEURONTIN) 300 MG capsule Take 1 capsule (300 mg total) by mouth 3 (three) times daily. 90 capsule 1   albuterol (PROAIR HFA) 108 (90 Base) MCG/ACT inhaler INHALE 2 PUFFS INTO THE LUNGS DAILY AS NEEDED 8.5 g 3   allopurinol (ZYLOPRIM) 100 MG tablet Take 1 tablet (100 mg total) by mouth daily. 30 tablet 0   atorvastatin (LIPITOR) 80 MG tablet Take 1 tablet (80 mg total) by mouth daily. Pt need appt 30 tablet 0   colesevelam (WELCHOL) 625 MG tablet Take 3 tablets (1,875 mg total) by mouth 2 (two) times daily with a meal. 540 tablet 1   lisinopril (ZESTRIL) 40 MG tablet Take 1 tablet (40 mg total) by mouth daily. 30 tablet 0   nebivolol (BYSTOLIC) 10 MG tablet Take 1 tablet (10 mg total) by mouth daily. Pt needs appt 30 tablet 0   sildenafil (REVATIO) 20 MG tablet TAKE ONE TO FIVE TABLETS BY MOUTH ONCE A DAY AS NEEDED 150 tablet 1   No facility-administered medications prior to visit.   No Known Allergies  Review of Systems  Constitutional:  Negative for chills and fever.  HENT:  Negative for congestion, ear pain, sinus pain and sore throat.   Respiratory:  Negative for cough, shortness of breath and wheezing.   Cardiovascular:  Negative for chest pain and palpitations.  Gastrointestinal:  Negative for abdominal pain, blood in stool, constipation, diarrhea, nausea and vomiting.  Genitourinary:  Negative for dysuria, frequency, hematuria and urgency.  Musculoskeletal:  Negative for joint pain and myalgias.  Skin:  Negative for itching and rash.       (-) New moles.  Neurological:  Negative for headaches.      Objective:    Physical Exam Constitutional:       General: He is not in acute distress.    Appearance: Normal appearance. He is not ill-appearing.  HENT:     Head: Normocephalic and atraumatic.     Right Ear: External ear normal.     Left Ear: External ear normal.     Mouth/Throat:     Mouth: Mucous membranes are moist.  Pharynx: Oropharynx is clear.  Eyes:     Extraocular Movements: Extraocular movements intact.     Pupils: Pupils are equal, round, and reactive to light.  Cardiovascular:     Rate and Rhythm: Normal rate and regular rhythm.     Pulses: Normal pulses.     Heart sounds: Normal heart sounds. No murmur heard.    No gallop.  Pulmonary:     Effort: Pulmonary effort is normal. No respiratory distress.     Breath sounds: Normal breath sounds. No wheezing or rales.  Abdominal:     General: Bowel sounds are normal.  Skin:    General: Skin is warm and dry.  Neurological:     Mental Status: He is alert and oriented to person, place, and time.  Psychiatric:        Mood and Affect: Mood normal.        Behavior: Behavior normal.        Judgment: Judgment normal.    BP 138/80 (BP Location: Right Arm, Patient Position: Sitting, Cuff Size: Normal)   Pulse 71   Temp (!) 97.5 F (36.4 C) (Oral)   Resp 16   Ht '5\' 10"'$  (1.778 m)   Wt 285 lb 6.4 oz (129.5 kg)   SpO2 96%   BMI 40.95 kg/m  Wt Readings from Last 3 Encounters:  10/08/21 285 lb 6.4 oz (129.5 kg)  01/29/21 295 lb (133.8 kg)  10/29/20 (!) 308 lb 6.4 oz (139.9 kg)   Diabetic Foot Exam - Simple   No data filed    Lab Results  Component Value Date   WBC 8.4 10/29/2020   HGB 15.3 10/29/2020   HCT 45.5 10/29/2020   PLT 266.0 10/29/2020   GLUCOSE 107 (H) 01/29/2021   CHOL 113 10/29/2020   TRIG 175.0 (H) 10/29/2020   HDL 31.40 (L) 10/29/2020   LDLDIRECT 74.0 04/28/2019   LDLCALC 46 10/29/2020   ALT 35 10/29/2020   AST 27 10/29/2020   NA 139 01/29/2021   K 4.9 01/29/2021   CL 102 01/29/2021   CREATININE 1.21 01/29/2021   BUN 18 01/29/2021    CO2 29 01/29/2021   TSH 1.85 10/29/2020   PSA 1.28 11/01/2019   HGBA1C 6.0 01/29/2021   Lab Results  Component Value Date   TSH 1.85 10/29/2020   Lab Results  Component Value Date   WBC 8.4 10/29/2020   HGB 15.3 10/29/2020   HCT 45.5 10/29/2020   MCV 87.7 10/29/2020   PLT 266.0 10/29/2020   Lab Results  Component Value Date   NA 139 01/29/2021   K 4.9 01/29/2021   CO2 29 01/29/2021   GLUCOSE 107 (H) 01/29/2021   BUN 18 01/29/2021   CREATININE 1.21 01/29/2021   BILITOT 0.7 10/29/2020   ALKPHOS 62 10/29/2020   AST 27 10/29/2020   ALT 35 10/29/2020   PROT 6.8 10/29/2020   ALBUMIN 4.3 10/29/2020   CALCIUM 10.0 01/29/2021   GFR 66.06 01/29/2021   Lab Results  Component Value Date   CHOL 113 10/29/2020   Lab Results  Component Value Date   HDL 31.40 (L) 10/29/2020   Lab Results  Component Value Date   LDLCALC 46 10/29/2020   Lab Results  Component Value Date   TRIG 175.0 (H) 10/29/2020   Lab Results  Component Value Date   CHOLHDL 4 10/29/2020   Lab Results  Component Value Date   HGBA1C 6.0 01/29/2021      Assessment & Plan:   Problem  List Items Addressed This Visit     Hyperlipidemia, mixed - Primary   Relevant Medications   colesevelam (WELCHOL) 625 MG tablet   nebivolol (BYSTOLIC) 10 MG tablet   lisinopril (ZESTRIL) 40 MG tablet   atorvastatin (LIPITOR) 80 MG tablet   sildenafil (REVATIO) 20 MG tablet   Other Relevant Orders   Lipid panel   Obesity    Encouraged DASH or MIND diet, decrease po intake and increase exercise as tolerated. Needs 7-8 hours of sleep nightly. Avoid trans fats, eat small, frequent meals every 4-5 hours with lean proteins, complex carbs and healthy fats. Minimize simple carbs, high fat foods and processed foods      Essential hypertension    Well controlled, no changes to meds. Encouraged heart healthy diet such as the DASH diet and exercise as tolerated.       Relevant Medications   colesevelam (WELCHOL) 625 MG  tablet   nebivolol (BYSTOLIC) 10 MG tablet   lisinopril (ZESTRIL) 40 MG tablet   atorvastatin (LIPITOR) 80 MG tablet   sildenafil (REVATIO) 20 MG tablet   Other Relevant Orders   CBC   Comprehensive metabolic panel   TSH   Allergic rhinitis    Ragweed Season is when he flares and sometimes needs Albuterol for some wheezing, has not needed it recently. Refill given on the Albuterol      Type 2 diabetes mellitus with morbid obesity (HCC)    hgba1c acceptable, minimize simple carbs. Increase exercise as tolerated. Continue current meds. Flu shot given today      Relevant Medications   lisinopril (ZESTRIL) 40 MG tablet   atorvastatin (LIPITOR) 80 MG tablet   Other Relevant Orders   Hemoglobin A1c   Microalbumin / creatinine urine ratio   Gout    Hydrate and monitor      Muscle cramp    Hydrate and monitor      Relevant Orders   Magnesium   Other Visit Diagnoses     Need for influenza vaccination       Relevant Orders   Flu Vaccine QUAD 6+ mos PF IM (Fluarix Quad PF) (Completed)      Meds ordered this encounter  Medications   albuterol (PROAIR HFA) 108 (90 Base) MCG/ACT inhaler    Sig: Inhale 2 puffs by mouth into the lungs daily as needed.    Dispense:  6.7 g    Refill:  3    Do not fill unless patient requests   colesevelam (WELCHOL) 625 MG tablet    Sig: Take 3 tablets (1,875 mg total) by mouth 2 (two) times daily with a meal.    Dispense:  540 tablet    Refill:  1   nebivolol (BYSTOLIC) 10 MG tablet    Sig: Take 1 tablet (10 mg total) by mouth daily. Pt needs appt    Dispense:  90 tablet    Refill:  1    Requested drug refills are authorized, however, the patient needs further evaluation and/or laboratory testing before further refills are given. Ask him to make an appointment for this.   allopurinol (ZYLOPRIM) 100 MG tablet    Sig: Take 1 tablet (100 mg total) by mouth daily.    Dispense:  90 tablet    Refill:  1   lisinopril (ZESTRIL) 40 MG tablet     Sig: Take 1 tablet (40 mg total) by mouth daily.    Dispense:  90 tablet    Refill:  1   atorvastatin (  LIPITOR) 80 MG tablet    Sig: Take 1 tablet (80 mg total) by mouth daily. Pt need appt    Dispense:  90 tablet    Refill:  1    Requested drug refills are authorized, however, the patient needs further evaluation and/or laboratory testing before further refills are given. Ask him to make an appointment for this.   sildenafil (REVATIO) 20 MG tablet    Sig: TAKE ONE TO FIVE TABLETS BY MOUTH ONCE A DAY AS NEEDED    Dispense:  150 tablet    Refill:  1   I, Penni Homans, MD, personally preformed the services described in this documentation.  All medical record entries made by the scribe were at my direction and in my presence.  I have reviewed the chart and discharge instructions (if applicable) and agree that the record reflects my personal performance and is accurate and complete. 10/08/2021  I,Mohammed Iqbal,acting as a scribe for Penni Homans, MD.,have documented all relevant documentation on the behalf of Penni Homans, MD,as directed by  Penni Homans, MD while in the presence of Penni Homans, MD.  Penni Homans, MD

## 2021-10-07 NOTE — Assessment & Plan Note (Signed)
Encouraged DASH or MIND diet, decrease po intake and increase exercise as tolerated. Needs 7-8 hours of sleep nightly. Avoid trans fats, eat small, frequent meals every 4-5 hours with lean proteins, complex carbs and healthy fats. Minimize simple carbs, high fat foods and processed foods 

## 2021-10-07 NOTE — Assessment & Plan Note (Addendum)
hgba1c acceptable, minimize simple carbs. Increase exercise as tolerated. Continue current meds. Flu shot given today 

## 2021-10-07 NOTE — Assessment & Plan Note (Signed)
Hydrate and monitor 

## 2021-10-07 NOTE — Assessment & Plan Note (Signed)
Well controlled, no changes to meds. Encouraged heart healthy diet such as the DASH diet and exercise as tolerated.  °

## 2021-10-08 ENCOUNTER — Encounter: Payer: Self-pay | Admitting: Family Medicine

## 2021-10-08 ENCOUNTER — Ambulatory Visit: Payer: BC Managed Care – PPO | Admitting: Family Medicine

## 2021-10-08 ENCOUNTER — Other Ambulatory Visit (HOSPITAL_BASED_OUTPATIENT_CLINIC_OR_DEPARTMENT_OTHER): Payer: Self-pay

## 2021-10-08 VITALS — BP 138/80 | HR 71 | Temp 97.5°F | Resp 16 | Ht 70.0 in | Wt 285.4 lb

## 2021-10-08 DIAGNOSIS — E669 Obesity, unspecified: Secondary | ICD-10-CM

## 2021-10-08 DIAGNOSIS — E1169 Type 2 diabetes mellitus with other specified complication: Secondary | ICD-10-CM

## 2021-10-08 DIAGNOSIS — E782 Mixed hyperlipidemia: Secondary | ICD-10-CM

## 2021-10-08 DIAGNOSIS — Z23 Encounter for immunization: Secondary | ICD-10-CM

## 2021-10-08 DIAGNOSIS — R252 Cramp and spasm: Secondary | ICD-10-CM | POA: Diagnosis not present

## 2021-10-08 DIAGNOSIS — M1000 Idiopathic gout, unspecified site: Secondary | ICD-10-CM | POA: Diagnosis not present

## 2021-10-08 DIAGNOSIS — J309 Allergic rhinitis, unspecified: Secondary | ICD-10-CM

## 2021-10-08 DIAGNOSIS — I10 Essential (primary) hypertension: Secondary | ICD-10-CM | POA: Diagnosis not present

## 2021-10-08 LAB — LIPID PANEL
Cholesterol: 106 mg/dL (ref 0–200)
HDL: 37 mg/dL — ABNORMAL LOW (ref >39.00)
LDL Cholesterol: 42 mg/dL (ref 0–99)
NonHDL: 68.88
Total CHOL/HDL Ratio: 3
Triglycerides: 136 mg/dL (ref 0.0–149.0)
VLDL: 27.2 mg/dL (ref 0.0–40.0)

## 2021-10-08 LAB — COMPREHENSIVE METABOLIC PANEL
ALT: 27 U/L (ref 0–53)
AST: 25 U/L (ref 0–37)
Albumin: 4.3 g/dL (ref 3.5–5.2)
Alkaline Phosphatase: 51 U/L (ref 39–117)
BUN: 14 mg/dL (ref 6–23)
CO2: 27 mEq/L (ref 19–32)
Calcium: 9.3 mg/dL (ref 8.4–10.5)
Chloride: 104 mEq/L (ref 96–112)
Creatinine, Ser: 1.16 mg/dL (ref 0.40–1.50)
GFR: 69.15 mL/min (ref >60.00)
Glucose, Bld: 97 mg/dL (ref 70–99)
Potassium: 4.8 mEq/L (ref 3.5–5.1)
Sodium: 139 mEq/L (ref 135–145)
Total Bilirubin: 0.9 mg/dL (ref 0.2–1.2)
Total Protein: 6.5 g/dL (ref 6.0–8.3)

## 2021-10-08 LAB — CBC
HCT: 45.7 % (ref 39.0–52.0)
Hemoglobin: 15.4 g/dL (ref 13.0–17.0)
MCHC: 33.8 g/dL (ref 30.0–36.0)
MCV: 87.5 fl (ref 78.0–100.0)
Platelets: 238 10*3/uL (ref 150.0–400.0)
RBC: 5.22 Mil/uL (ref 4.22–5.81)
RDW: 13.7 % (ref 11.5–15.5)
WBC: 6.5 10*3/uL (ref 4.0–10.5)

## 2021-10-08 LAB — MAGNESIUM: Magnesium: 1.7 mg/dL (ref 1.5–2.5)

## 2021-10-08 LAB — MICROALBUMIN / CREATININE URINE RATIO
Creatinine,U: 112.3 mg/dL
Microalb Creat Ratio: 0.6 mg/g (ref 0.0–30.0)
Microalb, Ur: <0.7 mg/dL (ref 0.0–1.9)

## 2021-10-08 LAB — TSH: TSH: 1.31 u[IU]/mL (ref 0.35–5.50)

## 2021-10-08 LAB — HEMOGLOBIN A1C: Hgb A1c MFr Bld: 6.2 % (ref 4.6–6.5)

## 2021-10-08 MED ORDER — NEBIVOLOL HCL 10 MG PO TABS
10.0000 mg | ORAL_TABLET | Freq: Every day | ORAL | 1 refills | Status: DC
  Filled 2021-10-08: qty 90, 90d supply, fill #0
  Filled 2021-11-01: qty 30, 30d supply, fill #0
  Filled 2021-12-17: qty 30, 30d supply, fill #1
  Filled 2022-01-16: qty 30, 30d supply, fill #2
  Filled 2022-02-13: qty 30, 30d supply, fill #3
  Filled 2022-03-27: qty 30, 30d supply, fill #4
  Filled 2022-04-22: qty 30, 30d supply, fill #5

## 2021-10-08 MED ORDER — ALBUTEROL SULFATE HFA 108 (90 BASE) MCG/ACT IN AERS
2.0000 | INHALATION_SPRAY | Freq: Every day | RESPIRATORY_TRACT | 3 refills | Status: AC | PRN
  Filled 2021-10-08: qty 6.7, 25d supply, fill #0
  Filled 2021-11-01: qty 6.7, 21d supply, fill #0

## 2021-10-08 MED ORDER — SILDENAFIL CITRATE 20 MG PO TABS: ORAL_TABLET | ORAL | 1 refills | Status: DC

## 2021-10-08 MED ORDER — COLESEVELAM HCL 625 MG PO TABS
1875.0000 mg | ORAL_TABLET | Freq: Two times a day (BID) | ORAL | 1 refills | Status: DC
  Filled 2021-10-08 – 2022-02-13 (×2): qty 540, 90d supply, fill #0
  Filled 2022-08-29: qty 540, 90d supply, fill #1

## 2021-10-08 MED ORDER — LISINOPRIL 40 MG PO TABS
40.0000 mg | ORAL_TABLET | Freq: Every day | ORAL | 1 refills | Status: DC
  Filled 2021-10-08: qty 90, 90d supply, fill #0
  Filled 2021-11-01: qty 86, 86d supply, fill #0
  Filled 2021-11-01: qty 4, 4d supply, fill #0
  Filled 2022-02-13: qty 90, 90d supply, fill #1

## 2021-10-08 MED ORDER — ATORVASTATIN CALCIUM 80 MG PO TABS
80.0000 mg | ORAL_TABLET | Freq: Every day | ORAL | 1 refills | Status: DC
  Filled 2021-10-08 – 2021-11-01 (×2): qty 90, 90d supply, fill #0
  Filled 2022-02-13: qty 90, 90d supply, fill #1

## 2021-10-08 MED ORDER — ALLOPURINOL 100 MG PO TABS
100.0000 mg | ORAL_TABLET | Freq: Every day | ORAL | 1 refills | Status: DC
  Filled 2021-10-08 – 2021-11-01 (×2): qty 90, 90d supply, fill #0
  Filled 2022-02-13: qty 90, 90d supply, fill #1

## 2021-10-08 NOTE — Assessment & Plan Note (Signed)
Ragweed Season is when he flares and sometimes needs Albuterol for some wheezing, has not needed it recently. Refill given on the Albuterol

## 2021-10-08 NOTE — Patient Instructions (Signed)
Consider Prevnar 20 New Covid booster   Carbohydrate Counting for Diabetes Mellitus, Adult Carbohydrate counting is a method of keeping track of how many carbohydrates you eat. Eating carbohydrates increases the amount of sugar (glucose) in the blood. Counting how many carbohydrates you eat improves how well you manage your blood glucose. This, in turn, helps you manage your diabetes. Carbohydrates are measured in grams (g) per serving. It is important to know how many carbohydrates (in grams or by serving size) you can have in each meal. This is different for every person. A dietitian can help you make a meal plan and calculate how many carbohydrates you should have at each meal and snack. What foods contain carbohydrates? Carbohydrates are found in the following foods: Grains, such as breads and cereals. Dried beans and soy products. Starchy vegetables, such as potatoes, peas, and corn. Fruit and fruit juices. Milk and yogurt. Sweets and snack foods, such as cake, cookies, candy, chips, and soft drinks. How do I count carbohydrates in foods? There are two ways to count carbohydrates in food. You can read food labels or learn standard serving sizes of foods. You can use either of these methods or a combination of both. Using the Nutrition Facts label The Nutrition Facts list is included on the labels of almost all packaged foods and beverages in the [place]. It includes: The serving size. Information about nutrients in each serving, including the grams of carbohydrate per serving. To use the Nutrition Facts, decide how many servings you will have. Then, multiply the number of servings by the number of carbohydrates per serving. The resulting number is the total grams of carbohydrates that you will be having. Learning the standard serving sizes of foods When you eat carbohydrate foods that are not packaged or do not include Nutrition Facts on the label, you need to measure the servings in  order to count the grams of carbohydrates. Measure the foods that you will eat with a food scale or measuring cup, if needed. Decide how many standard-size servings you will eat. Multiply the number of servings by 15. For foods that contain carbohydrates, one serving equals 15 g of carbohydrates. For example, if you eat 2 cups or 10 oz (300 g) of strawberries, you will have eaten 2 servings and 30 g of carbohydrates (2 servings x 15 g = 30 g). For foods that have more than one food mixed, such as soups and casseroles, you must count the carbohydrates in each food that is included. The following list contains standard serving sizes of common carbohydrate-rich foods. Each of these servings has about 15 g of carbohydrates: 1 slice of bread. 1 six-inch (15 cm) tortilla. ? cup or 2 oz (53 g) cooked rice or pasta.  cup or 3 oz (85 g) cooked or canned, drained and rinsed beans or lentils.  cup or 3 oz (85 g) starchy vegetable, such as peas, corn, or squash.  cup or 4 oz (120 g) hot cereal.  cup or 3 oz (85 g) boiled or mashed potatoes, or  or 3 oz (85 g) of a large baked potato.  cup or 4 fl oz (118 mL) fruit juice. 1 cup or 8 fl oz (237 mL) milk. 1 small or 4 oz (106 g) apple.  or 2 oz (63 g) of a medium banana. 1 cup or 5 oz (150 g) strawberries. 3 cups or 1 oz (28.3 g) popped popcorn. What is an example of carbohydrate counting? To calculate the grams of  carbohydrates in this sample meal, follow the steps shown below. Sample meal 3 oz (85 g) chicken breast. ? cup or 4 oz (106 g) brown rice.  cup or 3 oz (85 g) corn. 1 cup or 8 fl oz (237 mL) milk. 1 cup or 5 oz (150 g) strawberries with sugar-free whipped topping. Carbohydrate calculation Identify the foods that contain carbohydrates: Rice. Corn. Milk. Strawberries. Calculate how many servings you have of each food: 2 servings rice. 1 serving corn. 1 serving milk. 1 serving strawberries. Multiply each number of servings by  15 g: 2 servings rice x 15 g = 30 g. 1 serving corn x 15 g = 15 g. 1 serving milk x 15 g = 15 g. 1 serving strawberries x 15 g = 15 g. Add together all of the amounts to find the total grams of carbohydrates eaten: 30 g + 15 g + 15 g + 15 g = 75 g of carbohydrates total. What are tips for following this plan? Shopping Develop a meal plan and then make a shopping list. Buy fresh and frozen vegetables, fresh and frozen fruit, dairy, eggs, beans, lentils, and whole grains. Look at food labels. Choose foods that have more fiber and less sugar. Avoid processed foods and foods with added sugars. Meal planning Aim to have the same number of grams of carbohydrates at each meal and for each snack time. Plan to have regular, balanced meals and snacks. Where to find more information American Diabetes Association: diabetes.org Centers for Disease Control and Prevention: [employer].[place] Academy of Nutrition and Dietetics: eatright.org Association of Diabetes Care & Education Specialists: diabeteseducator.org Summary Carbohydrate counting is a method of keeping track of how many carbohydrates you eat. Eating carbohydrates increases the amount of sugar (glucose) in your blood. Counting how many carbohydrates you eat improves how well you manage your blood glucose. This helps you manage your diabetes. A dietitian can help you make a meal plan and calculate how many carbohydrates you should have at each meal and snack. This information is not intended to replace advice given to you by your health care provider. Make sure you discuss any questions you have with your health care provider. Document Revised: 07/27/2019 Document Reviewed: 07/27/2019 Elsevier Patient Education  [place].

## 2021-10-17 ENCOUNTER — Other Ambulatory Visit (HOSPITAL_BASED_OUTPATIENT_CLINIC_OR_DEPARTMENT_OTHER): Payer: Self-pay

## 2021-11-01 ENCOUNTER — Other Ambulatory Visit (HOSPITAL_BASED_OUTPATIENT_CLINIC_OR_DEPARTMENT_OTHER): Payer: Self-pay

## 2021-12-17 ENCOUNTER — Other Ambulatory Visit (HOSPITAL_BASED_OUTPATIENT_CLINIC_OR_DEPARTMENT_OTHER): Payer: Self-pay

## 2021-12-18 ENCOUNTER — Other Ambulatory Visit (HOSPITAL_BASED_OUTPATIENT_CLINIC_OR_DEPARTMENT_OTHER): Payer: Self-pay

## 2022-01-16 ENCOUNTER — Other Ambulatory Visit (HOSPITAL_BASED_OUTPATIENT_CLINIC_OR_DEPARTMENT_OTHER): Payer: Self-pay

## 2022-02-13 ENCOUNTER — Other Ambulatory Visit (HOSPITAL_BASED_OUTPATIENT_CLINIC_OR_DEPARTMENT_OTHER): Payer: Self-pay

## 2022-02-14 ENCOUNTER — Other Ambulatory Visit (HOSPITAL_BASED_OUTPATIENT_CLINIC_OR_DEPARTMENT_OTHER): Payer: Self-pay

## 2022-03-27 ENCOUNTER — Other Ambulatory Visit: Payer: Self-pay

## 2022-04-22 ENCOUNTER — Other Ambulatory Visit (HOSPITAL_BASED_OUTPATIENT_CLINIC_OR_DEPARTMENT_OTHER): Payer: Self-pay

## 2022-05-21 ENCOUNTER — Other Ambulatory Visit: Payer: Self-pay | Admitting: Family Medicine

## 2022-05-22 ENCOUNTER — Other Ambulatory Visit (HOSPITAL_BASED_OUTPATIENT_CLINIC_OR_DEPARTMENT_OTHER): Payer: Self-pay

## 2022-05-28 ENCOUNTER — Other Ambulatory Visit: Payer: Self-pay | Admitting: Family Medicine

## 2022-05-29 ENCOUNTER — Other Ambulatory Visit (HOSPITAL_BASED_OUTPATIENT_CLINIC_OR_DEPARTMENT_OTHER): Payer: Self-pay

## 2022-05-29 MED ORDER — ATORVASTATIN CALCIUM 80 MG PO TABS
80.0000 mg | ORAL_TABLET | Freq: Every day | ORAL | 1 refills | Status: DC
  Filled 2022-05-29: qty 90, 90d supply, fill #0
  Filled 2022-08-29: qty 90, 90d supply, fill #1

## 2022-05-29 MED ORDER — ALLOPURINOL 100 MG PO TABS
100.0000 mg | ORAL_TABLET | Freq: Every day | ORAL | 1 refills | Status: DC
  Filled 2022-05-29: qty 90, 90d supply, fill #0
  Filled 2022-08-29: qty 90, 90d supply, fill #1

## 2022-05-29 MED ORDER — NEBIVOLOL HCL 10 MG PO TABS
10.0000 mg | ORAL_TABLET | Freq: Every day | ORAL | 1 refills | Status: DC
  Filled 2022-05-29: qty 30, 30d supply, fill #0
  Filled 2022-07-18: qty 30, 30d supply, fill #1
  Filled 2022-08-29: qty 30, 30d supply, fill #2
  Filled 2022-09-23: qty 30, 30d supply, fill #3
  Filled 2022-11-10: qty 30, 30d supply, fill #4
  Filled 2022-12-05: qty 30, 30d supply, fill #5

## 2022-05-29 MED ORDER — LISINOPRIL 40 MG PO TABS
40.0000 mg | ORAL_TABLET | Freq: Every day | ORAL | 1 refills | Status: DC
  Filled 2022-05-29: qty 90, 90d supply, fill #0
  Filled 2022-08-29: qty 90, 90d supply, fill #1

## 2022-07-19 ENCOUNTER — Other Ambulatory Visit: Payer: Self-pay

## 2022-07-21 ENCOUNTER — Other Ambulatory Visit (HOSPITAL_BASED_OUTPATIENT_CLINIC_OR_DEPARTMENT_OTHER): Payer: Self-pay

## 2022-08-29 ENCOUNTER — Other Ambulatory Visit (HOSPITAL_BASED_OUTPATIENT_CLINIC_OR_DEPARTMENT_OTHER): Payer: Self-pay

## 2022-08-29 ENCOUNTER — Other Ambulatory Visit: Payer: Self-pay

## 2022-09-01 ENCOUNTER — Other Ambulatory Visit (HOSPITAL_BASED_OUTPATIENT_CLINIC_OR_DEPARTMENT_OTHER): Payer: Self-pay

## 2022-09-23 ENCOUNTER — Other Ambulatory Visit (HOSPITAL_BASED_OUTPATIENT_CLINIC_OR_DEPARTMENT_OTHER): Payer: Self-pay

## 2022-09-25 ENCOUNTER — Other Ambulatory Visit (HOSPITAL_BASED_OUTPATIENT_CLINIC_OR_DEPARTMENT_OTHER): Payer: Self-pay

## 2022-10-08 NOTE — Assessment & Plan Note (Signed)
Well controlled, no changes to meds. Encouraged heart healthy diet such as the DASH diet and exercise as tolerated.  °

## 2022-10-08 NOTE — Assessment & Plan Note (Signed)
Hydrate and monitor 

## 2022-10-08 NOTE — Assessment & Plan Note (Signed)
Encouraged DASH or MIND diet, decrease po intake and increase exercise as tolerated. Needs 7-8 hours of sleep nightly. Avoid trans fats, eat small, frequent meals every 4-5 hours with lean proteins, complex carbs and healthy fats. Minimize simple carbs, high fat foods and processed foods 

## 2022-10-08 NOTE — Assessment & Plan Note (Signed)
Encourage heart healthy diet such as MIND or DASH diet, increase exercise, avoid trans fats, simple carbohydrates and processed foods, consider a krill or fish or flaxseed oil cap daily. Tolerating Atorvastatin 

## 2022-10-08 NOTE — Assessment & Plan Note (Addendum)
hgba1c acceptable, minimize simple carbs. Increase exercise as tolerated. Continue current meds 

## 2022-10-09 ENCOUNTER — Other Ambulatory Visit (HOSPITAL_BASED_OUTPATIENT_CLINIC_OR_DEPARTMENT_OTHER): Payer: Self-pay

## 2022-10-09 ENCOUNTER — Ambulatory Visit: Payer: BC Managed Care – PPO | Admitting: Family Medicine

## 2022-10-09 VITALS — BP 130/84 | HR 74 | Temp 98.0°F | Resp 16 | Ht 70.0 in | Wt 294.2 lb

## 2022-10-09 DIAGNOSIS — Z7985 Long-term (current) use of injectable non-insulin antidiabetic drugs: Secondary | ICD-10-CM

## 2022-10-09 DIAGNOSIS — E669 Obesity, unspecified: Secondary | ICD-10-CM | POA: Diagnosis not present

## 2022-10-09 DIAGNOSIS — M1000 Idiopathic gout, unspecified site: Secondary | ICD-10-CM

## 2022-10-09 DIAGNOSIS — Z23 Encounter for immunization: Secondary | ICD-10-CM

## 2022-10-09 DIAGNOSIS — I10 Essential (primary) hypertension: Secondary | ICD-10-CM

## 2022-10-09 DIAGNOSIS — E1169 Type 2 diabetes mellitus with other specified complication: Secondary | ICD-10-CM | POA: Diagnosis not present

## 2022-10-09 DIAGNOSIS — E782 Mixed hyperlipidemia: Secondary | ICD-10-CM | POA: Diagnosis not present

## 2022-10-09 DIAGNOSIS — R252 Cramp and spasm: Secondary | ICD-10-CM | POA: Diagnosis not present

## 2022-10-09 LAB — CBC WITH DIFFERENTIAL/PLATELET
Basophils Absolute: 0.1 10*3/uL (ref 0.0–0.1)
Basophils Relative: 1 % (ref 0.0–3.0)
Eosinophils Absolute: 0.2 10*3/uL (ref 0.0–0.7)
Eosinophils Relative: 2.5 % (ref 0.0–5.0)
HCT: 49.5 % (ref 39.0–52.0)
Hemoglobin: 16.3 g/dL (ref 13.0–17.0)
Lymphocytes Relative: 32.6 % (ref 12.0–46.0)
Lymphs Abs: 2.8 10*3/uL (ref 0.7–4.0)
MCHC: 33 g/dL (ref 30.0–36.0)
MCV: 89.4 fL (ref 78.0–100.0)
Monocytes Absolute: 0.7 10*3/uL (ref 0.1–1.0)
Monocytes Relative: 8.1 % (ref 3.0–12.0)
Neutro Abs: 4.9 10*3/uL (ref 1.4–7.7)
Neutrophils Relative %: 55.8 % (ref 43.0–77.0)
Platelets: 258 10*3/uL (ref 150.0–400.0)
RBC: 5.54 Mil/uL (ref 4.22–5.81)
RDW: 13.7 % (ref 11.5–15.5)
WBC: 8.7 10*3/uL (ref 4.0–10.5)

## 2022-10-09 LAB — COMPREHENSIVE METABOLIC PANEL
ALT: 26 U/L (ref 0–53)
AST: 26 U/L (ref 0–37)
Albumin: 4.3 g/dL (ref 3.5–5.2)
Alkaline Phosphatase: 55 U/L (ref 39–117)
BUN: 13 mg/dL (ref 6–23)
CO2: 31 meq/L (ref 19–32)
Calcium: 10.3 mg/dL (ref 8.4–10.5)
Chloride: 100 meq/L (ref 96–112)
Creatinine, Ser: 1.18 mg/dL (ref 0.40–1.50)
GFR: 67.27 mL/min (ref >60.00)
Glucose, Bld: 87 mg/dL (ref 70–99)
Potassium: 4.4 meq/L (ref 3.5–5.1)
Sodium: 139 meq/L (ref 135–145)
Total Bilirubin: 0.8 mg/dL (ref 0.2–1.2)
Total Protein: 6.8 g/dL (ref 6.0–8.3)

## 2022-10-09 LAB — HEMOGLOBIN A1C: Hgb A1c MFr Bld: 6.3 % (ref 4.6–6.5)

## 2022-10-09 LAB — LIPID PANEL
Cholesterol: 109 mg/dL (ref 0–200)
HDL: 37.6 mg/dL — ABNORMAL LOW (ref >39.00)
LDL Cholesterol: 25 mg/dL (ref 0–99)
NonHDL: 71.86
Total CHOL/HDL Ratio: 3
Triglycerides: 233 mg/dL — ABNORMAL HIGH (ref 0.0–149.0)
VLDL: 46.6 mg/dL — ABNORMAL HIGH (ref 0.0–40.0)

## 2022-10-09 LAB — MICROALBUMIN / CREATININE URINE RATIO
Creatinine,U: 150.3 mg/dL
Microalb Creat Ratio: 0.6 mg/g (ref 0.0–30.0)
Microalb, Ur: 0.9 mg/dL (ref 0.0–1.9)

## 2022-10-09 LAB — MAGNESIUM: Magnesium: 1.7 mg/dL (ref 1.5–2.5)

## 2022-10-09 LAB — TSH: TSH: 1.75 u[IU]/mL (ref 0.35–5.50)

## 2022-10-09 LAB — URIC ACID: Uric Acid, Serum: 7 mg/dL (ref 4.0–7.8)

## 2022-10-09 MED ORDER — TIRZEPATIDE 2.5 MG/0.5ML ~~LOC~~ SOAJ
2.5000 mg | SUBCUTANEOUS | 1 refills | Status: DC
  Filled 2022-10-09: qty 2, 28d supply, fill #0
  Filled 2022-11-10: qty 2, 28d supply, fill #1

## 2022-10-09 MED ORDER — GABAPENTIN 300 MG PO CAPS: 300.0000 mg | ORAL_CAPSULE | Freq: Two times a day (BID) | ORAL | Status: AC | PRN

## 2022-10-09 NOTE — Patient Instructions (Signed)

## 2022-10-10 ENCOUNTER — Other Ambulatory Visit (HOSPITAL_BASED_OUTPATIENT_CLINIC_OR_DEPARTMENT_OTHER): Payer: Self-pay

## 2022-10-10 ENCOUNTER — Telehealth: Payer: Self-pay

## 2022-10-10 NOTE — Telephone Encounter (Signed)
Message sent

## 2022-10-12 ENCOUNTER — Encounter: Payer: Self-pay | Admitting: Family Medicine

## 2022-10-12 NOTE — Progress Notes (Signed)
Subjective:    Patient ID: Barry Anthony, male    DOB: [birth year], 62 y.o.   MRN: [id number]  Chief Complaint  Patient presents with  . Medication Refill    Medication Refill    HPI Discussed the use of AI scribe software for clinical note transcription with the patient, who gave verbal consent to proceed.  History of Present Illness   The patient, with a history of hypertension and hypercholesterolemia, presents for a discussion about starting a new weight loss medication, Mounjaro. The patient expresses concerns about potential side effects and insurance coverage. They are currently on Welchol for cholesterol and a blood pressure medication. The patient also mentions having recovered from COVID-19 in August. They have not been using Flonase or albuterol regularly and have some leftover gabapentin, which they occasionally use for sleep. The patient also mentions having had muscle cramps in the past, which have been managed with pre-hydration and Gatorade.        Past Medical History:  Diagnosis Date  . Allergic rhinitis   . Allergy   . Bilateral hydrocele 12/08/2012  . GERD (gastroesophageal reflux disease)   . History of chicken pox   . Hyperglycemia 08/15/2008   Qualifier: Diagnosis of  By: Nena Jordan    . Hyperlipidemia   . Hypertension   . Lipoma of shoulder   . Preventative health care 02/12/2014  . Testicular pain, left 08/04/2016    Past Surgical History:  Procedure Laterality Date  . EYE SURGERY    . HERNIA REPAIR     groin  . KNEE SURGERY  1981   right knee  . TONSILLECTOMY  1969  . WISDOM TOOTH EXTRACTION      Family History  Problem Relation Age of Onset  . Thyroid cancer Father   . Hypertension Father   . Coronary artery disease Father   . Hyperlipidemia Father   . Alcohol abuse Father        smoker  . Heart disease Father   . Hypothyroidism Mother   . Arthritis Mother   . Dementia Maternal Aunt   . Dementia Maternal Uncle   . Cancer  Maternal Grandmother        breast  . Heart disease Paternal Grandmother        heart disease  . Other Other        no colon cancer, no prostate canceer  . Colon cancer Neg Hx     Social History   Socioeconomic History  . Marital status: Divorced    Spouse name: Not on file  . Number of children: Not on file  . Years of education: Not on file  . Highest education level: Bachelor's degree (e.g., BA, AB, BS)  Occupational History  . Not on file  Tobacco Use  . Smoking status: Never  . Smokeless tobacco: Never  Vaping Use  . Vaping status: Never Used  Substance and Sexual Activity  . Alcohol use: Yes    Comment: 2 drinks / week  . Drug use: No  . Sexual activity: Yes    Comment: lives with wife, works as a Games developer at [employer]. no major dietary restrictions  Other Topics Concern  . Not on file  Social History Narrative   Occupation:  Parts mgr   Married 16 yrs    2 children  10, 12    Never Smoked    Alcohol use-yes (social)    Social Determinants of Health   Financial  Resource Strain: Low Risk  (10/08/2022)   Overall Financial Resource Strain (CARDIA)   . Difficulty of Paying Living Expenses: Not hard at all  Food Insecurity: No Food Insecurity (10/08/2022)   Hunger Vital Sign   . Worried About Programme researcher, broadcasting/film/video in the Last Year: Never true   . Ran Out of Food in the Last Year: Never true  Transportation Needs: No Transportation Needs (10/08/2022)   PRAPARE - Transportation   . Lack of Transportation (Medical): No   . Lack of Transportation (Non-Medical): No  Physical Activity: Insufficiently Active (10/08/2022)   Exercise Vital Sign   . Days of Exercise per Week: 4 days   . Minutes of Exercise per Session: 30 min  Stress: No Stress Concern Present (10/08/2022)   [employer] of Occupational Health - Occupational Stress Questionnaire   . Feeling of Stress : Only a little  Social Connections: Moderately Isolated (10/08/2022)   Social  Connection and Isolation Panel [NHANES]   . Frequency of Communication with Friends and Family: More than three times a week   . Frequency of Social Gatherings with Friends and Family: Twice a week   . Attends Religious Services: 1 to 4 times per year   . Active Member of Clubs or Organizations: No   . Attends Banker Meetings: Not on file   . Marital Status: Divorced  Catering manager Violence: Not on file    Outpatient Medications Prior to Visit  Medication Sig Dispense Refill  . albuterol (PROAIR HFA) 108 (90 Base) MCG/ACT inhaler Inhale 2 puffs by mouth into the lungs daily as needed. 6.7 g 3  . allopurinol (ZYLOPRIM) 100 MG tablet Take 1 tablet (100 mg total) by mouth daily. 90 tablet 1  . atorvastatin (LIPITOR) 80 MG tablet Take 1 tablet (80 mg total) by mouth daily. Pt need appt 90 tablet 1  . colesevelam (WELCHOL) 625 MG tablet Take 3 tablets (1,875 mg total) by mouth 2 (two) times daily with a meal. 540 tablet 1  . fluticasone (FLONASE) 50 MCG/ACT nasal spray Place 2 sprays into both nostrils daily as needed. 48 g 3  . lisinopril (ZESTRIL) 40 MG tablet Take 1 tablet (40 mg total) by mouth daily. 90 tablet 1  . nebivolol (BYSTOLIC) 10 MG tablet Take 1 tablet (10 mg total) by mouth daily. Pt needs appt 90 tablet 1  . sildenafil (REVATIO) 20 MG tablet TAKE ONE TO FIVE TABLETS BY MOUTH ONCE A DAY AS NEEDED 150 tablet 1  . gabapentin (NEURONTIN) 300 MG capsule Take 1 capsule (300 mg total) by mouth 3 (three) times daily. 90 capsule 1   No facility-administered medications prior to visit.    No Known Allergies  Review of Systems  Constitutional:  Negative for fever and malaise/fatigue.  HENT:  Negative for congestion.   Eyes:  Negative for blurred vision.  Respiratory:  Negative for shortness of breath.   Cardiovascular:  Negative for chest pain, palpitations and leg swelling.  Gastrointestinal:  Negative for abdominal pain, blood in stool and nausea.  Genitourinary:   Negative for dysuria and frequency.  Musculoskeletal:  Positive for myalgias. Negative for falls.  Skin:  Negative for rash.  Neurological:  Negative for dizziness, loss of consciousness and headaches.  Endo/Heme/Allergies:  Negative for environmental allergies.  Psychiatric/Behavioral:  Negative for depression. The patient is not nervous/anxious.        Objective:    Physical Exam Vitals reviewed.  Constitutional:      Appearance: Normal  appearance. He is not ill-appearing.  HENT:     Head: Normocephalic and atraumatic.     Nose: Nose normal.  Eyes:     Conjunctiva/sclera: Conjunctivae normal.  Cardiovascular:     Rate and Rhythm: Normal rate.     Pulses: Normal pulses.     Heart sounds: Normal heart sounds. No murmur heard. Pulmonary:     Effort: Pulmonary effort is normal.     Breath sounds: Normal breath sounds. No wheezing.  Abdominal:     Palpations: Abdomen is soft. There is no mass.     Tenderness: There is no abdominal tenderness.  Musculoskeletal:     Cervical back: Normal range of motion.     Right lower leg: No edema.     Left lower leg: No edema.  Skin:    General: Skin is warm and dry.  Neurological:     General: No focal deficit present.     Mental Status: He is alert and oriented to person, place, and time.  Psychiatric:        Mood and Affect: Mood normal.    BP 130/84 (BP Location: Left Arm, Patient Position: Sitting, Cuff Size: Normal)   Pulse 74   Temp 98 F (36.7 C) (Oral)   Resp 16   Ht 5\' 10"  (1.778 m)   Wt 294 lb 3.2 oz (133.4 kg)   SpO2 95%   BMI 42.21 kg/m  Wt Readings from Last 3 Encounters:  10/09/22 294 lb 3.2 oz (133.4 kg)  10/08/21 285 lb 6.4 oz (129.5 kg)  01/29/21 295 lb (133.8 kg)    Diabetic Foot Exam - Simple   No data filed    Lab Results  Component Value Date   WBC 8.7 10/09/2022   HGB 16.3 10/09/2022   HCT 49.5 10/09/2022   PLT 258.0 10/09/2022   GLUCOSE 87 10/09/2022   CHOL 109 10/09/2022   TRIG 233.0 (H)  10/09/2022   HDL 37.60 (L) 10/09/2022   LDLDIRECT 74.0 04/28/2019   LDLCALC 25 10/09/2022   ALT 26 10/09/2022   AST 26 10/09/2022   NA 139 10/09/2022   K 4.4 10/09/2022   CL 100 10/09/2022   CREATININE 1.18 10/09/2022   BUN 13 10/09/2022   CO2 31 10/09/2022   TSH 1.75 10/09/2022   PSA 1.28 11/01/2019   HGBA1C 6.3 10/09/2022   MICROALBUR 0.9 10/09/2022    Lab Results  Component Value Date   TSH 1.75 10/09/2022   Lab Results  Component Value Date   WBC 8.7 10/09/2022   HGB 16.3 10/09/2022   HCT 49.5 10/09/2022   MCV 89.4 10/09/2022   PLT 258.0 10/09/2022   Lab Results  Component Value Date   NA 139 10/09/2022   K 4.4 10/09/2022   CO2 31 10/09/2022   GLUCOSE 87 10/09/2022   BUN 13 10/09/2022   CREATININE 1.18 10/09/2022   BILITOT 0.8 10/09/2022   ALKPHOS 55 10/09/2022   AST 26 10/09/2022   ALT 26 10/09/2022   PROT 6.8 10/09/2022   ALBUMIN 4.3 10/09/2022   CALCIUM 10.3 10/09/2022   GFR 67.27 10/09/2022   Lab Results  Component Value Date   CHOL 109 10/09/2022   Lab Results  Component Value Date   HDL 37.60 (L) 10/09/2022   Lab Results  Component Value Date   LDLCALC 25 10/09/2022   Lab Results  Component Value Date   TRIG 233.0 (H) 10/09/2022   Lab Results  Component Value Date   CHOLHDL 3 10/09/2022  Lab Results  Component Value Date   HGBA1C 6.3 10/09/2022       Assessment & Plan:  Essential hypertension Assessment & Plan: Well controlled, no changes to meds. Encouraged heart healthy diet such as the DASH diet and exercise as tolerated.   Orders: -     Comprehensive metabolic panel -     CBC with Differential/Platelet -     TSH  Hyperlipidemia, mixed Assessment & Plan: Encourage heart healthy diet such as MIND or DASH diet, increase exercise, avoid trans fats, simple carbohydrates and processed foods, consider a krill or fish or flaxseed oil cap daily. Tolerating Atorvastatin  Orders: -     Lipid panel  Obesity, unspecified  class, unspecified obesity type, unspecified whether serious comorbidity present Assessment & Plan: Encouraged DASH or MIND diet, decrease po intake and increase exercise as tolerated. Needs 7-8 hours of sleep nightly. Avoid trans fats, eat small, frequent meals every 4-5 hours with lean proteins, complex carbs and healthy fats. Minimize simple carbs, high fat foods and processed foods   Type 2 diabetes mellitus with morbid obesity (HCC) Assessment & Plan: hgba1c acceptable, minimize simple carbs. Increase exercise as tolerated. Continue current meds.   Orders: -     Hemoglobin A1c -     Microalbumin / creatinine urine ratio  Muscle cramp Assessment & Plan: Hydrate and monitor  Orders: -     Magnesium  Idiopathic gout, unspecified chronicity, unspecified site Assessment & Plan: Hydrate and monitor  Orders: -     Uric acid  Need for influenza vaccination -     Flu vaccine trivalent PF, 6mos and older(Flulaval,Afluria,Fluarix,Fluzone)  Other orders -     Tirzepatide; Inject 2.5 mg into the skin once a week.  Dispense: 2 mL; Refill: 1 -     Gabapentin; Take 1 capsule (300 mg total) by mouth 2 (two) times daily as needed.    Assessment and Plan    Weight Management/Diabetes Discussed starting Mounjaro for weight loss. The patient is aware of the potential side effects and the need for a healthy diet and hydration while on this medication. -Start Mounjaro at 2.5mg  weekly, with a plan to titrate up to 15mg  over 1-2 months. -Check baseline blood work today. -Encourage patient to maintain a healthy diet and hydration. -Plan to follow up in 3-4 months to assess response to [facility].  Chronic Medications The patient is currently on Welchol, Allopurinol, and occasionally uses Gabapentin for sleep. -Continue current medications. -Use Gabapentin as needed for sleep.  Vaccinations The patient received a flu shot today. -Plan to get a COVID booster in a couple of  weeks.  Follow-up Plan to follow up in 3-4 months to assess response to [facility] and again in 6 months for a regular check-up.         Danise Edge, MD

## 2022-10-13 ENCOUNTER — Other Ambulatory Visit (HOSPITAL_BASED_OUTPATIENT_CLINIC_OR_DEPARTMENT_OTHER): Payer: Self-pay

## 2022-10-14 ENCOUNTER — Other Ambulatory Visit (HOSPITAL_BASED_OUTPATIENT_CLINIC_OR_DEPARTMENT_OTHER): Payer: Self-pay

## 2022-10-15 ENCOUNTER — Telehealth: Payer: Self-pay

## 2022-10-15 NOTE — Telephone Encounter (Signed)
Pharmacy Patient Advocate Encounter   Received notification from Pt Calls Messages that prior authorization for Mounjaro 2.5MG /0.5ML auto-injectors is required/requested.   Insurance verification completed.   The patient is insured through [facility] .   Per test claim: PA required; PA submitted to [employer] via CoverMyMeds Key/confirmation #/EOC [id number] Status is pending

## 2022-10-15 NOTE — Telephone Encounter (Signed)
PA request has been Submitted. New Encounter created for follow up. For additional info see Pharmacy Prior Auth telephone encounter from 10/15/2022.

## 2022-10-16 ENCOUNTER — Other Ambulatory Visit (HOSPITAL_BASED_OUTPATIENT_CLINIC_OR_DEPARTMENT_OTHER): Payer: Self-pay

## 2022-10-17 ENCOUNTER — Other Ambulatory Visit (HOSPITAL_BASED_OUTPATIENT_CLINIC_OR_DEPARTMENT_OTHER): Payer: Self-pay

## 2022-10-17 ENCOUNTER — Encounter: Payer: Self-pay | Admitting: Family Medicine

## 2022-10-21 NOTE — Telephone Encounter (Signed)
Called pt PA has been approved Mounjaro.

## 2022-10-21 NOTE — Telephone Encounter (Signed)
Pharmacy Patient Advocate Encounter  Received notification from [facility] that Prior Authorization for Greggory Keen has been APPROVED from 10/17/2022 to 10/16/2023   PA #/Case ID/Reference #: [id number]  PLEASE NOTE: The allowed amount is 2 mL (4 pens) per 180 days. This medication is to be increased in strength every 30 days, as tolerated by the member, to a maintenance dose of up to 15mg /0.62mL (4 pens per 28 days). The program limit of 4 pens per 180 days of the 2.5mg /0.52mL strength allows for this dose increase.

## 2022-11-10 ENCOUNTER — Encounter: Payer: Self-pay | Admitting: Family Medicine

## 2022-11-10 ENCOUNTER — Other Ambulatory Visit: Payer: Self-pay | Admitting: Family Medicine

## 2022-11-10 ENCOUNTER — Other Ambulatory Visit (HOSPITAL_BASED_OUTPATIENT_CLINIC_OR_DEPARTMENT_OTHER): Payer: Self-pay

## 2022-11-10 MED ORDER — MOUNJARO 2.5 MG/0.5ML ~~LOC~~ SOAJ
2.5000 mg | SUBCUTANEOUS | 1 refills | Status: DC
  Filled 2022-11-10 – 2022-11-17 (×2): qty 2, 28d supply, fill #0

## 2022-11-10 NOTE — Telephone Encounter (Signed)
Okay to send in 2.5 mg?

## 2022-11-17 ENCOUNTER — Other Ambulatory Visit (HOSPITAL_BASED_OUTPATIENT_CLINIC_OR_DEPARTMENT_OTHER): Payer: Self-pay

## 2022-11-18 ENCOUNTER — Other Ambulatory Visit (HOSPITAL_BASED_OUTPATIENT_CLINIC_OR_DEPARTMENT_OTHER): Payer: Self-pay

## 2022-11-19 ENCOUNTER — Other Ambulatory Visit (HOSPITAL_BASED_OUTPATIENT_CLINIC_OR_DEPARTMENT_OTHER): Payer: Self-pay

## 2022-11-19 ENCOUNTER — Other Ambulatory Visit: Payer: Self-pay | Admitting: Family Medicine

## 2022-11-19 MED ORDER — TIRZEPATIDE 5 MG/0.5ML ~~LOC~~ SOAJ
5.0000 mg | SUBCUTANEOUS | 1 refills | Status: DC
  Filled 2022-11-19: qty 6, 84d supply, fill #0

## 2022-11-20 ENCOUNTER — Other Ambulatory Visit: Payer: Self-pay

## 2022-11-20 ENCOUNTER — Other Ambulatory Visit (HOSPITAL_BASED_OUTPATIENT_CLINIC_OR_DEPARTMENT_OTHER): Payer: Self-pay

## 2022-11-20 MED ORDER — TIRZEPATIDE 5 MG/0.5ML ~~LOC~~ SOAJ
5.0000 mg | SUBCUTANEOUS | 0 refills | Status: DC
  Filled 2022-11-20: qty 2, 28d supply, fill #0

## 2022-11-20 MED ORDER — TIRZEPATIDE 2.5 MG/0.5ML ~~LOC~~ SOAJ
2.5000 mg | SUBCUTANEOUS | 0 refills | Status: DC
  Filled 2022-11-20: qty 2, 28d supply, fill #0

## 2022-11-25 ENCOUNTER — Other Ambulatory Visit: Payer: Self-pay | Admitting: Family Medicine

## 2022-11-25 ENCOUNTER — Other Ambulatory Visit: Payer: Self-pay

## 2022-11-25 ENCOUNTER — Other Ambulatory Visit (HOSPITAL_BASED_OUTPATIENT_CLINIC_OR_DEPARTMENT_OTHER): Payer: Self-pay

## 2022-11-25 MED ORDER — COLESEVELAM HCL 625 MG PO TABS
1875.0000 mg | ORAL_TABLET | Freq: Two times a day (BID) | ORAL | 1 refills | Status: AC
  Filled 2022-11-25: qty 540, 90d supply, fill #0
  Filled 2023-09-28 (×2): qty 540, 90d supply, fill #1

## 2022-11-25 MED ORDER — LISINOPRIL 40 MG PO TABS
40.0000 mg | ORAL_TABLET | Freq: Every day | ORAL | 1 refills | Status: DC
  Filled 2022-11-25: qty 90, 90d supply, fill #0
  Filled 2023-02-22: qty 90, 90d supply, fill #1

## 2022-11-25 MED ORDER — ATORVASTATIN CALCIUM 80 MG PO TABS
80.0000 mg | ORAL_TABLET | Freq: Every day | ORAL | 1 refills | Status: DC
  Filled 2022-11-25: qty 90, 90d supply, fill #0
  Filled 2023-02-22: qty 90, 90d supply, fill #1

## 2022-11-25 MED ORDER — ALLOPURINOL 100 MG PO TABS
100.0000 mg | ORAL_TABLET | Freq: Every day | ORAL | 1 refills | Status: DC
  Filled 2022-11-25: qty 90, 90d supply, fill #0
  Filled 2023-02-22: qty 90, 90d supply, fill #1

## 2022-11-26 ENCOUNTER — Other Ambulatory Visit: Payer: Self-pay

## 2022-11-26 ENCOUNTER — Other Ambulatory Visit (HOSPITAL_BASED_OUTPATIENT_CLINIC_OR_DEPARTMENT_OTHER): Payer: Self-pay

## 2022-12-05 ENCOUNTER — Other Ambulatory Visit: Payer: Self-pay

## 2022-12-08 ENCOUNTER — Other Ambulatory Visit: Payer: Self-pay | Admitting: Family Medicine

## 2022-12-09 ENCOUNTER — Other Ambulatory Visit (HOSPITAL_BASED_OUTPATIENT_CLINIC_OR_DEPARTMENT_OTHER): Payer: Self-pay

## 2022-12-09 MED ORDER — MOUNJARO 5 MG/0.5ML ~~LOC~~ SOAJ
5.0000 mg | SUBCUTANEOUS | 1 refills | Status: DC
  Filled 2022-12-09 – 2022-12-11 (×2): qty 2, 28d supply, fill #0
  Filled ????-??-??: fill #1

## 2022-12-11 ENCOUNTER — Other Ambulatory Visit (HOSPITAL_BASED_OUTPATIENT_CLINIC_OR_DEPARTMENT_OTHER): Payer: Self-pay

## 2022-12-18 ENCOUNTER — Other Ambulatory Visit (HOSPITAL_BASED_OUTPATIENT_CLINIC_OR_DEPARTMENT_OTHER): Payer: Self-pay

## 2023-01-05 ENCOUNTER — Encounter: Payer: Self-pay | Admitting: Family Medicine

## 2023-01-05 ENCOUNTER — Other Ambulatory Visit: Payer: Self-pay | Admitting: Family Medicine

## 2023-01-05 ENCOUNTER — Other Ambulatory Visit: Payer: Self-pay | Admitting: Emergency Medicine

## 2023-01-05 ENCOUNTER — Other Ambulatory Visit (HOSPITAL_BASED_OUTPATIENT_CLINIC_OR_DEPARTMENT_OTHER): Payer: Self-pay

## 2023-01-05 MED ORDER — TIRZEPATIDE 7.5 MG/0.5ML ~~LOC~~ SOAJ
7.5000 mg | SUBCUTANEOUS | 1 refills | Status: DC
  Filled 2023-01-05: qty 2, 28d supply, fill #0
  Filled 2023-02-03: qty 2, 28d supply, fill #1
  Filled 2023-03-03: qty 2, 28d supply, fill #2
  Filled 2023-03-27: qty 2, 28d supply, fill #3
  Filled 2023-04-27: qty 2, 28d supply, fill #4
  Filled 2023-05-21: qty 2, 28d supply, fill #5

## 2023-01-05 MED ORDER — NEBIVOLOL HCL 10 MG PO TABS
10.0000 mg | ORAL_TABLET | Freq: Every day | ORAL | 1 refills | Status: DC
  Filled 2023-01-05: qty 90, 90d supply, fill #0
  Filled 2023-04-10: qty 90, 90d supply, fill #1

## 2023-01-05 MED ORDER — TIRZEPATIDE 7.5 MG/0.5ML ~~LOC~~ SOAJ: 7.5000 mg | SUBCUTANEOUS | 1 refills | Status: DC

## 2023-01-05 NOTE — Telephone Encounter (Signed)
Called patient and he wanted the [facility] sent to [facility] pharmacy. Called [facility] and cancelled the other one

## 2023-01-12 NOTE — Assessment & Plan Note (Signed)
 Hydrate and monitor

## 2023-01-12 NOTE — Assessment & Plan Note (Signed)
 hgba1c acceptable, minimize simple carbs. Increase exercise as tolerated. Continue current meds

## 2023-01-12 NOTE — Assessment & Plan Note (Signed)
 Encouraged DASH or MIND diet, decrease po intake and increase exercise as tolerated. Needs 7-8 hours of sleep nightly. Avoid trans fats, eat small, frequent meals every 4-5 hours with lean proteins, complex carbs and healthy fats. Minimize simple carbs, high fat foods and processed foods

## 2023-01-12 NOTE — Assessment & Plan Note (Signed)
 Well controlled, no changes to meds. Encouraged heart healthy diet such as the DASH diet and exercise as tolerated.

## 2023-01-12 NOTE — Assessment & Plan Note (Signed)
 Encourage heart healthy diet such as MIND or DASH diet, increase exercise, avoid trans fats, simple carbohydrates and processed foods, consider a krill or fish or flaxseed oil cap daily. Tolerating Atorvastatin

## 2023-01-13 ENCOUNTER — Ambulatory Visit: Payer: BC Managed Care – PPO | Admitting: Family Medicine

## 2023-01-13 ENCOUNTER — Other Ambulatory Visit (HOSPITAL_BASED_OUTPATIENT_CLINIC_OR_DEPARTMENT_OTHER): Payer: Self-pay

## 2023-01-13 ENCOUNTER — Encounter: Payer: Self-pay | Admitting: Family Medicine

## 2023-01-13 VITALS — BP 128/68 | HR 83 | Temp 98.1°F | Resp 18 | Ht 70.0 in | Wt 272.8 lb

## 2023-01-13 DIAGNOSIS — I10 Essential (primary) hypertension: Secondary | ICD-10-CM | POA: Diagnosis not present

## 2023-01-13 DIAGNOSIS — M1000 Idiopathic gout, unspecified site: Secondary | ICD-10-CM | POA: Diagnosis not present

## 2023-01-13 DIAGNOSIS — Z6839 Body mass index (BMI) 39.0-39.9, adult: Secondary | ICD-10-CM

## 2023-01-13 DIAGNOSIS — E669 Obesity, unspecified: Secondary | ICD-10-CM

## 2023-01-13 DIAGNOSIS — E782 Mixed hyperlipidemia: Secondary | ICD-10-CM | POA: Diagnosis not present

## 2023-01-13 DIAGNOSIS — R252 Cramp and spasm: Secondary | ICD-10-CM

## 2023-01-13 DIAGNOSIS — E1169 Type 2 diabetes mellitus with other specified complication: Secondary | ICD-10-CM | POA: Diagnosis not present

## 2023-01-13 LAB — CBC WITH DIFFERENTIAL/PLATELET
Basophils Absolute: 0.1 10*3/uL (ref 0.0–0.1)
Basophils Relative: 1 % (ref 0.0–3.0)
Eosinophils Absolute: 0.3 10*3/uL (ref 0.0–0.7)
Eosinophils Relative: 4.3 % (ref 0.0–5.0)
HCT: 44.2 % (ref 39.0–52.0)
Hemoglobin: 15 g/dL (ref 13.0–17.0)
Lymphocytes Relative: 29 % (ref 12.0–46.0)
Lymphs Abs: 2.3 10*3/uL (ref 0.7–4.0)
MCHC: 34 g/dL (ref 30.0–36.0)
MCV: 87.9 fL (ref 78.0–100.0)
Monocytes Absolute: 0.6 10*3/uL (ref 0.1–1.0)
Monocytes Relative: 8 % (ref 3.0–12.0)
Neutro Abs: 4.6 10*3/uL (ref 1.4–7.7)
Neutrophils Relative %: 57.7 % (ref 43.0–77.0)
Platelets: 236 10*3/uL (ref 150.0–400.0)
RBC: 5.03 Mil/uL (ref 4.22–5.81)
RDW: 13.8 % (ref 11.5–15.5)
WBC: 7.9 10*3/uL (ref 4.0–10.5)

## 2023-01-13 LAB — LIPID PANEL
Cholesterol: 103 mg/dL (ref 0–200)
HDL: 28.3 mg/dL — ABNORMAL LOW (ref >39.00)
LDL Cholesterol: 15 mg/dL (ref 0–99)
NonHDL: 74.6
Total CHOL/HDL Ratio: 4
Triglycerides: 300 mg/dL — ABNORMAL HIGH (ref 0.0–149.0)
VLDL: 60 mg/dL — ABNORMAL HIGH (ref 0.0–40.0)

## 2023-01-13 LAB — MICROALBUMIN / CREATININE URINE RATIO
Creatinine,U: 129 mg/dL
Microalb Creat Ratio: 0.6 mg/g (ref 0.0–30.0)
Microalb, Ur: 0.7 mg/dL (ref 0.0–1.9)

## 2023-01-13 LAB — COMPREHENSIVE METABOLIC PANEL
ALT: 13 U/L (ref 0–53)
AST: 16 U/L (ref 0–37)
Albumin: 4.1 g/dL (ref 3.5–5.2)
Alkaline Phosphatase: 67 U/L (ref 39–117)
BUN: 16 mg/dL (ref 6–23)
CO2: 30 meq/L (ref 19–32)
Calcium: 9.3 mg/dL (ref 8.4–10.5)
Chloride: 102 meq/L (ref 96–112)
Creatinine, Ser: 1.15 mg/dL (ref 0.40–1.50)
GFR: 69.26 mL/min (ref >60.00)
Glucose, Bld: 89 mg/dL (ref 70–99)
Potassium: 4.3 meq/L (ref 3.5–5.1)
Sodium: 140 meq/L (ref 135–145)
Total Bilirubin: 0.6 mg/dL (ref 0.2–1.2)
Total Protein: 6.4 g/dL (ref 6.0–8.3)

## 2023-01-13 LAB — HEMOGLOBIN A1C: Hgb A1c MFr Bld: 6.1 % (ref 4.6–6.5)

## 2023-01-13 LAB — TSH: TSH: 1.71 u[IU]/mL (ref 0.35–5.50)

## 2023-01-13 MED ORDER — COMIRNATY 30 MCG/0.3ML IM SUSY
0.3000 mL | PREFILLED_SYRINGE | Freq: Once | INTRAMUSCULAR | 0 refills | Status: AC
  Filled 2023-01-13: qty 0.3, 1d supply, fill #0

## 2023-01-13 MED ORDER — RSVPREF3 VAC RECOMB ADJUVANTED 120 MCG/0.5ML IM SUSR
0.5000 mL | Freq: Once | INTRAMUSCULAR | 0 refills | Status: AC
  Filled 2023-01-13: qty 0.5, 1d supply, fill #0

## 2023-01-13 NOTE — Patient Instructions (Addendum)
 RSV, Respiratory Syncitial Virus, Arexvy  at pharmacy  COVID, at pharmacy  Prevnar 20  at pharmacy  Encouraged increased hydration and fiber in diet. Daily probiotics. If bowels not moving can use MOM 2 tbls po in 4 oz of warm prune juice by mouth every 2-3 days. If no results then repeat in 4 hours with  Dulcolax suppository pr, may repeat again in 4 more hours as needed. Seek care if symptoms worsen. Consider daily Miralax and/or Dulcolax if symptoms persist.   Consider a probiotic to the Metamucil Constipation, Adult Constipation is when a person has fewer than three bowel movements in a week, has difficulty having a bowel movement, or has stools (feces) that are dry, hard, or larger than normal. Constipation may be caused by an underlying condition. It may become worse with age if a person takes certain medicines and does not take in enough fluids. Follow these instructions at home: Eating and drinking  Eat foods that have a lot of fiber, such as beans, whole grains, and fresh fruits and vegetables. Limit foods that are low in fiber and high in fat and processed sugars, such as fried or sweet foods. These include french fries, hamburgers, cookies, candies, and soda. Drink enough fluid to keep your urine pale yellow. General instructions Exercise regularly or as told by your health care provider. Try to do 150 minutes of moderate exercise each week. Use the bathroom when you have the urge to go. Do not hold it in. Take over-the-counter and prescription medicines only as told by your health care provider. This includes any fiber supplements. During bowel movements: Practice deep breathing while relaxing the lower abdomen. Practice pelvic floor relaxation. Watch your condition for any changes. Let your health care provider know about them. Keep all follow-up visits as told by your health care provider. This is important. Contact a health care provider if: You have pain that gets worse. You  have a fever. You do not have a bowel movement after 4 days. You vomit. You are not hungry or you lose weight. You are bleeding from the opening between the buttocks (anus). You have thin, pencil-like stools. Get help right away if: You have a fever and your symptoms suddenly get worse. You leak stool or have blood in your stool. Your abdomen is bloated. You have severe pain in your abdomen. You feel dizzy or you faint. Summary Constipation is when a person has fewer than three bowel movements in a week, has difficulty having a bowel movement, or has stools (feces) that are dry, hard, or larger than normal. Eat foods that have a lot of fiber, such as beans, whole grains, and fresh fruits and vegetables. Drink enough fluid to keep your urine pale yellow. Take over-the-counter and prescription medicines only as told by your health care provider. This includes any fiber supplements. This information is not intended to replace advice given to you by your health care provider. Make sure you discuss any questions you have with your health care provider. Document Revised: 11/06/2021 Document Reviewed: 11/06/2021 Elsevier Patient Education  2024 [employer].

## 2023-01-13 NOTE — Progress Notes (Signed)
 Subjective:    Patient ID: Barry Anthony, male    DOB: 1960-10-15, 63 y.o.   MRN: [id number]  Chief Complaint  Patient presents with  . Follow-up    3 month    HPI Discussed the use of AI scribe software for clinical note transcription with the patient, who gave verbal consent to proceed.  History of Present Illness   The patient, with a history of diabetes and obesity, presents for a routine follow-up. They report significant weight loss, approximately 22 pounds, with the help of a weight loss medication. The patient recently increased the dosage of the medication due to a plateau in weight loss. They report some changes in bowel habits, alternating between constipation and loose stools, which they manage with Metamucil. The patient also mentions a history of frequent pneumonia in childhood, possibly related to exposure to secondhand smoke.  The patient has not yet received their COVID vaccination or RSV vaccination and is due for a pneumonia vaccination. They express willingness to receive these vaccinations. The patient has already received their flu shot for the season.        Past Medical History:  Diagnosis Date  . Allergic rhinitis   . Allergy   . Bilateral hydrocele 12/08/2012  . GERD (gastroesophageal reflux disease)   . History of chicken pox   . Hyperglycemia 08/15/2008   Qualifier: Diagnosis of  By: Georgian ROSALEA CHARM Lamar    . Hyperlipidemia   . Hypertension   . Lipoma of shoulder   . Preventative health care 02/12/2014  . Testicular pain, left 08/04/2016    Past Surgical History:  Procedure Laterality Date  . EYE SURGERY    . HERNIA REPAIR     groin  . KNEE SURGERY  1981   right knee  . TONSILLECTOMY  1969  . WISDOM TOOTH EXTRACTION      Family History  Problem Relation Age of Onset  . Thyroid  cancer Father   . Hypertension Father   . Coronary artery disease Father   . Hyperlipidemia Father   . Alcohol abuse Father        smoker  . Heart disease Father    . Hypothyroidism Mother   . Arthritis Mother   . Dementia Maternal Aunt   . Dementia Maternal Uncle   . Cancer Maternal Grandmother        breast  . Heart disease Paternal Grandmother        heart disease  . Other Other        no colon cancer, no prostate canceer  . Colon cancer Neg Hx     Social History   Socioeconomic History  . Marital status: Divorced    Spouse name: Not on file  . Number of children: Not on file  . Years of education: Not on file  . Highest education level: Bachelor's degree (e.g., BA, AB, BS)  Occupational History  . Not on file  Tobacco Use  . Smoking status: Never  . Smokeless tobacco: Never  Vaping Use  . Vaping status: Never Used  Substance and Sexual Activity  . Alcohol use: Yes    Comment: 2 drinks / week  . Drug use: No  . Sexual activity: Yes    Comment: lives with wife, works as a games developer at [employer]. no major dietary restrictions  Other Topics Concern  . Not on file  Social History Narrative   Occupation:  Parts mgr   Married 16 yrs  2 children  10, 12    Never Smoked    Alcohol use-yes (social)    Social Drivers of Health   Financial Resource Strain: Low Risk  (01/12/2023)   Overall Financial Resource Strain (CARDIA)   . Difficulty of Paying Living Expenses: Not hard at all  Food Insecurity: No Food Insecurity (01/12/2023)   Hunger Vital Sign   . Worried About Programme Researcher[employer] Broadcasting/film[employer]video in the Last Year: Never true   . Ran Out of Food in the Last Year: Never true  Transportation Needs: No Transportation Needs (01/12/2023)   PRAPARE - Transportation   . Lack of Transportation (Medical): No   . Lack of Transportation (Non-Medical): No  Physical Activity: Insufficiently Active (01/12/2023)   Exercise Vital Sign   . Days of Exercise per Week: 2 days   . Minutes of Exercise per Session: 30 min  Stress: No Stress Concern Present (01/12/2023)   [employer] of Occupational Health - Occupational Stress Questionnaire   .  Feeling of Stress : Not at all  Social Connections: Socially Isolated (01/12/2023)   Social Connection and Isolation Panel [NHANES]   . Frequency of Communication with Friends and Family: More than three times a week   . Frequency of Social Gatherings with Friends and Family: Twice a week   . Attends Religious Services: Never   . Active Member of Clubs or Organizations: No   . Attends Banker Meetings: Not on file   . Marital Status: Divorced  Catering Manager Violence: Not on file    Outpatient Medications Prior to Visit  Medication Sig Dispense Refill  . albuterol  (PROAIR  HFA) 108 (90 Base) MCG/ACT inhaler Inhale 2 puffs by mouth into the lungs daily as needed. 6.7 g 3  . allopurinol  (ZYLOPRIM ) 100 MG tablet Take 1 tablet (100 mg total) by mouth daily. 90 tablet 1  . atorvastatin  (LIPITOR) 80 MG tablet Take 1 tablet (80 mg total) by mouth daily. 90 tablet 1  . colesevelam  (WELCHOL ) 625 MG tablet Take 3 tablets (1,875 mg total) by mouth 2 (two) times daily with a meal. 540 tablet 1  . fluticasone  (FLONASE ) 50 MCG/ACT nasal spray Place 2 sprays into both nostrils daily as needed. 48 g 3  . gabapentin  (NEURONTIN ) 300 MG capsule Take 1 capsule (300 mg total) by mouth 2 (two) times daily as needed.    . lisinopril  (ZESTRIL ) 40 MG tablet Take 1 tablet (40 mg total) by mouth daily. 90 tablet 1  . nebivolol  (BYSTOLIC ) 10 MG tablet Take 1 tablet (10 mg total) by mouth daily. 90 tablet 1  . sildenafil  (REVATIO ) 20 MG tablet TAKE ONE TO FIVE TABLETS BY MOUTH ONCE A DAY AS NEEDED 150 tablet 1  . tirzepatide  (MOUNJARO ) 7.5 MG/0.5ML Pen Inject 7.5 mg into the skin once a week. 6 mL 1   No facility-administered medications prior to visit.    No Known Allergies  Review of Systems  Constitutional:  Negative for fever and malaise/fatigue.  HENT:  Negative for congestion.   Eyes:  Negative for blurred vision.  Respiratory:  Negative for shortness of breath.   Cardiovascular:  Negative  for chest pain, palpitations and leg swelling.  Gastrointestinal:  Positive for constipation and diarrhea. Negative for abdominal pain, blood in stool and nausea.  Genitourinary:  Negative for dysuria and frequency.  Musculoskeletal:  Negative for falls.  Skin:  Negative for rash.  Neurological:  Negative for dizziness, loss of consciousness and headaches.  Endo/Heme/Allergies:  Negative for  environmental allergies.  Psychiatric/Behavioral:  Negative for depression. The patient is not nervous/anxious.        Objective:    Physical Exam Vitals reviewed.  Constitutional:      Appearance: Normal appearance. He is not ill-appearing.  HENT:     Head: Normocephalic and atraumatic.     Nose: Nose normal.  Eyes:     Conjunctiva/sclera: Conjunctivae normal.  Cardiovascular:     Rate and Rhythm: Normal rate.     Pulses: Normal pulses.     Heart sounds: Normal heart sounds. No murmur heard. Pulmonary:     Effort: Pulmonary effort is normal.     Breath sounds: Normal breath sounds. No wheezing.  Abdominal:     Palpations: Abdomen is soft. There is no mass.     Tenderness: There is no abdominal tenderness.  Musculoskeletal:     Cervical back: Normal range of motion.     Right lower leg: No edema.     Left lower leg: No edema.  Skin:    General: Skin is warm and dry.  Neurological:     General: No focal deficit present.     Mental Status: He is alert and oriented to person, place, and time.  Psychiatric:        Mood and Affect: Mood normal.    BP 128/68 (BP Location: Left Arm, Patient Position: Sitting, Cuff Size: Large)   Pulse 83   Temp 98.1 F (36.7 C) (Oral)   Resp 18   Ht 5' 10 (1.778 m)   Wt 272 lb 12.8 oz (123.7 kg)   SpO2 95%   BMI 39.14 kg/m  Wt Readings from Last 3 Encounters:  01/13/23 272 lb 12.8 oz (123.7 kg)  10/09/22 294 lb 3.2 oz (133.4 kg)  10/08/21 285 lb 6.4 oz (129.5 kg)    Diabetic Foot Exam - Simple   No data filed    Lab Results  Component  Value Date   WBC 7.9 01/13/2023   HGB 15.0 01/13/2023   HCT 44.2 01/13/2023   PLT 236.0 01/13/2023   GLUCOSE 89 01/13/2023   CHOL 103 01/13/2023   TRIG 300.0 (H) 01/13/2023   HDL 28.30 (L) 01/13/2023   LDLDIRECT 74.0 04/28/2019   LDLCALC 15 01/13/2023   ALT 13 01/13/2023   AST 16 01/13/2023   NA 140 01/13/2023   K 4.3 01/13/2023   CL 102 01/13/2023   CREATININE 1.15 01/13/2023   BUN 16 01/13/2023   CO2 30 01/13/2023   TSH 1.71 01/13/2023   PSA 1.28 11/01/2019   HGBA1C 6.1 01/13/2023   MICROALBUR 0.7 01/13/2023    Lab Results  Component Value Date   TSH 1.71 01/13/2023   Lab Results  Component Value Date   WBC 7.9 01/13/2023   HGB 15.0 01/13/2023   HCT 44.2 01/13/2023   MCV 87.9 01/13/2023   PLT 236.0 01/13/2023   Lab Results  Component Value Date   NA 140 01/13/2023   K 4.3 01/13/2023   CO2 30 01/13/2023   GLUCOSE 89 01/13/2023   BUN 16 01/13/2023   CREATININE 1.15 01/13/2023   BILITOT 0.6 01/13/2023   ALKPHOS 67 01/13/2023   AST 16 01/13/2023   ALT 13 01/13/2023   PROT 6.4 01/13/2023   ALBUMIN 4.1 01/13/2023   CALCIUM  9.3 01/13/2023   GFR 69.26 01/13/2023   Lab Results  Component Value Date   CHOL 103 01/13/2023   Lab Results  Component Value Date   HDL 28.30 (L) 01/13/2023  Lab Results  Component Value Date   LDLCALC 15 01/13/2023   Lab Results  Component Value Date   TRIG 300.0 (H) 01/13/2023   Lab Results  Component Value Date   CHOLHDL 4 01/13/2023   Lab Results  Component Value Date   HGBA1C 6.1 01/13/2023       Assessment & Plan:  Essential hypertension Assessment & Plan: Well controlled, no changes to meds. Encouraged heart healthy diet such as the DASH diet and exercise as tolerated.   Orders: -     Comprehensive metabolic panel -     CBC with Differential/Platelet -     TSH  Idiopathic gout, unspecified chronicity, unspecified site Assessment & Plan: Hydrate and monitor    Hyperlipidemia, mixed Assessment &  Plan: Encourage heart healthy diet such as MIND or DASH diet, increase exercise, avoid trans fats, simple carbohydrates and processed foods, consider a krill or fish or flaxseed oil cap daily. Tolerating Atorvastatin   Orders: -     Lipid panel  Muscle cramp Assessment & Plan: Hydrate and monitor   Obesity, unspecified class, unspecified obesity type, unspecified whether serious comorbidity present Assessment & Plan: Encouraged DASH or MIND diet, decrease po intake and increase exercise as tolerated. Needs 7-8 hours of sleep nightly. Avoid trans fats, eat small, frequent meals every 4-5 hours with lean proteins, complex carbs and healthy fats. Minimize simple carbs, high fat foods and processed foods   Type 2 diabetes mellitus with morbid obesity (HCC) Assessment & Plan: hgba1c acceptable, minimize simple carbs. Increase exercise as tolerated. Continue current meds.   Orders: -     Hemoglobin A1c -     Microalbumin / creatinine urine ratio    Assessment and Plan    Obesity Discussed the genetic and environmental factors contributing to obesity. Patient is currently on a weight loss medication and has lost significant weight. Discussed the possibility of increasing the dose if needed. -Continue current weight loss medication. -Consider increasing the dose if weight loss plateaus.  Constipation Patient reports constipation. Discussed the benefits of fiber supplements and probiotics. -Continue Metamucil. -Consider adding a probiotic. -Use Milk of Magnesia and warm prune juice as needed for constipation.  Immunizations Discussed the benefits of various vaccines including COVID-19 booster, RSV, and Prevnar 20 . -Receive COVID-19 booster and RSV vaccine today. -Receive Prevnar 20  vaccine in a couple of weeks.  General Health Maintenance -Order labs today to monitor metabolic health. -Schedule follow-up appointment in 3-4 months.         Harlene Horton, MD

## 2023-01-14 ENCOUNTER — Other Ambulatory Visit (HOSPITAL_BASED_OUTPATIENT_CLINIC_OR_DEPARTMENT_OTHER): Payer: Self-pay

## 2023-01-14 ENCOUNTER — Other Ambulatory Visit: Payer: Self-pay | Admitting: *Deleted

## 2023-01-14 MED ORDER — EZETIMIBE 10 MG PO TABS
10.0000 mg | ORAL_TABLET | Freq: Every day | ORAL | 3 refills | Status: DC
  Filled 2023-01-14: qty 30, 30d supply, fill #0
  Filled 2023-02-03 – 2023-02-09 (×2): qty 30, 30d supply, fill #1
  Filled 2023-03-12: qty 30, 30d supply, fill #2
  Filled 2023-04-10: qty 30, 30d supply, fill #3

## 2023-02-03 ENCOUNTER — Other Ambulatory Visit: Payer: Self-pay

## 2023-02-04 ENCOUNTER — Other Ambulatory Visit (HOSPITAL_BASED_OUTPATIENT_CLINIC_OR_DEPARTMENT_OTHER): Payer: Self-pay

## 2023-02-04 MED ORDER — PREVNAR 20 0.5 ML IM SUSY
0.5000 mL | PREFILLED_SYRINGE | Freq: Once | INTRAMUSCULAR | 0 refills | Status: AC
  Filled 2023-02-04: qty 0.5, 1d supply, fill #0

## 2023-04-10 ENCOUNTER — Other Ambulatory Visit: Payer: Self-pay

## 2023-04-16 ENCOUNTER — Encounter: Payer: BC Managed Care – PPO | Admitting: Family Medicine

## 2023-05-08 ENCOUNTER — Other Ambulatory Visit (HOSPITAL_BASED_OUTPATIENT_CLINIC_OR_DEPARTMENT_OTHER): Payer: Self-pay

## 2023-05-08 ENCOUNTER — Other Ambulatory Visit: Payer: Self-pay | Admitting: Family Medicine

## 2023-05-08 MED ORDER — EZETIMIBE 10 MG PO TABS
10.0000 mg | ORAL_TABLET | Freq: Every day | ORAL | 1 refills | Status: DC
  Filled 2023-05-08: qty 90, 90d supply, fill #0
  Filled 2023-08-10: qty 90, 90d supply, fill #1

## 2023-05-21 ENCOUNTER — Other Ambulatory Visit: Payer: Self-pay

## 2023-05-21 ENCOUNTER — Other Ambulatory Visit (HOSPITAL_BASED_OUTPATIENT_CLINIC_OR_DEPARTMENT_OTHER): Payer: Self-pay

## 2023-05-21 ENCOUNTER — Other Ambulatory Visit: Payer: Self-pay | Admitting: Family Medicine

## 2023-05-21 DIAGNOSIS — E782 Mixed hyperlipidemia: Secondary | ICD-10-CM

## 2023-05-21 DIAGNOSIS — M1000 Idiopathic gout, unspecified site: Secondary | ICD-10-CM

## 2023-05-21 DIAGNOSIS — I10 Essential (primary) hypertension: Secondary | ICD-10-CM

## 2023-05-21 MED ORDER — LISINOPRIL 40 MG PO TABS
40.0000 mg | ORAL_TABLET | Freq: Every day | ORAL | 0 refills | Status: DC
Start: 2023-05-21 — End: 2023-06-17
  Filled 2023-05-21: qty 30, 30d supply, fill #0

## 2023-05-21 MED ORDER — ATORVASTATIN CALCIUM 80 MG PO TABS
80.0000 mg | ORAL_TABLET | Freq: Every day | ORAL | 0 refills | Status: DC
  Filled 2023-05-21: qty 30, 30d supply, fill #0

## 2023-05-21 MED ORDER — ALLOPURINOL 100 MG PO TABS
100.0000 mg | ORAL_TABLET | Freq: Every day | ORAL | 0 refills | Status: DC
Start: 2023-05-21 — End: 2023-06-17
  Filled 2023-05-21: qty 30, 30d supply, fill #0

## 2023-06-17 ENCOUNTER — Other Ambulatory Visit: Payer: Self-pay | Admitting: Family Medicine

## 2023-06-17 ENCOUNTER — Other Ambulatory Visit (HOSPITAL_BASED_OUTPATIENT_CLINIC_OR_DEPARTMENT_OTHER): Payer: Self-pay

## 2023-06-17 DIAGNOSIS — M1000 Idiopathic gout, unspecified site: Secondary | ICD-10-CM

## 2023-06-17 DIAGNOSIS — I10 Essential (primary) hypertension: Secondary | ICD-10-CM

## 2023-06-17 DIAGNOSIS — E782 Mixed hyperlipidemia: Secondary | ICD-10-CM

## 2023-06-17 MED ORDER — ATORVASTATIN CALCIUM 80 MG PO TABS
80.0000 mg | ORAL_TABLET | Freq: Every day | ORAL | 1 refills | Status: DC
  Filled 2023-06-17: qty 90, 90d supply, fill #0
  Filled 2023-09-28 (×2): qty 90, 90d supply, fill #1

## 2023-06-17 MED ORDER — LISINOPRIL 40 MG PO TABS
40.0000 mg | ORAL_TABLET | Freq: Every day | ORAL | 1 refills | Status: DC
  Filled 2023-06-17: qty 90, 90d supply, fill #0
  Filled 2023-09-28 (×2): qty 90, 90d supply, fill #1

## 2023-06-17 MED ORDER — ALLOPURINOL 100 MG PO TABS
100.0000 mg | ORAL_TABLET | Freq: Every day | ORAL | 1 refills | Status: DC
  Filled 2023-06-17: qty 90, 90d supply, fill #0
  Filled 2023-09-28 (×2): qty 90, 90d supply, fill #1

## 2023-06-23 ENCOUNTER — Other Ambulatory Visit (HOSPITAL_BASED_OUTPATIENT_CLINIC_OR_DEPARTMENT_OTHER): Payer: Self-pay

## 2023-06-23 ENCOUNTER — Other Ambulatory Visit: Payer: Self-pay | Admitting: Family Medicine

## 2023-06-23 MED ORDER — MOUNJARO 7.5 MG/0.5ML ~~LOC~~ SOAJ
7.5000 mg | SUBCUTANEOUS | 1 refills | Status: DC
  Filled 2023-06-23: qty 6, 84d supply, fill #0

## 2023-06-24 NOTE — Assessment & Plan Note (Signed)
 Hydrate and monitor

## 2023-06-24 NOTE — Assessment & Plan Note (Signed)
 Well controlled, no changes to meds. Encouraged heart healthy diet such as the DASH diet and exercise as tolerated.

## 2023-06-24 NOTE — Assessment & Plan Note (Signed)
 hgba1c acceptable, minimize simple carbs. Increase exercise as tolerated. Continue current meds

## 2023-06-25 ENCOUNTER — Ambulatory Visit: Admitting: Family Medicine

## 2023-06-25 ENCOUNTER — Other Ambulatory Visit (HOSPITAL_BASED_OUTPATIENT_CLINIC_OR_DEPARTMENT_OTHER): Payer: Self-pay

## 2023-06-25 ENCOUNTER — Encounter: Payer: Self-pay | Admitting: Family Medicine

## 2023-06-25 VITALS — BP 116/66 | HR 77 | Resp 16 | Ht 70.0 in | Wt 242.8 lb

## 2023-06-25 DIAGNOSIS — I10 Essential (primary) hypertension: Secondary | ICD-10-CM

## 2023-06-25 DIAGNOSIS — E782 Mixed hyperlipidemia: Secondary | ICD-10-CM | POA: Diagnosis not present

## 2023-06-25 DIAGNOSIS — E1169 Type 2 diabetes mellitus with other specified complication: Secondary | ICD-10-CM | POA: Diagnosis not present

## 2023-06-25 DIAGNOSIS — Z0000 Encounter for general adult medical examination without abnormal findings: Secondary | ICD-10-CM

## 2023-06-25 DIAGNOSIS — K649 Unspecified hemorrhoids: Secondary | ICD-10-CM

## 2023-06-25 DIAGNOSIS — R351 Nocturia: Secondary | ICD-10-CM

## 2023-06-25 DIAGNOSIS — R252 Cramp and spasm: Secondary | ICD-10-CM | POA: Diagnosis not present

## 2023-06-25 DIAGNOSIS — M1000 Idiopathic gout, unspecified site: Secondary | ICD-10-CM

## 2023-06-25 MED ORDER — SILDENAFIL CITRATE 20 MG PO TABS
ORAL_TABLET | ORAL | 1 refills | Status: DC
  Filled 2023-06-25: qty 150, 30d supply, fill #0

## 2023-06-25 MED ORDER — SILDENAFIL CITRATE 20 MG PO TABS: ORAL_TABLET | ORAL | 1 refills | Status: AC

## 2023-06-25 MED ORDER — TIRZEPATIDE 10 MG/0.5ML ~~LOC~~ SOAJ
10.0000 mg | SUBCUTANEOUS | 2 refills | Status: DC
  Filled 2023-06-25: qty 6, 84d supply, fill #0
  Filled 2023-09-08: qty 2, 28d supply, fill #0
  Filled 2023-10-10: qty 2, 28d supply, fill #1
  Filled 2023-11-03: qty 2, 28d supply, fill #2
  Filled 2023-12-07: qty 2, 28d supply, fill #3
  Filled 2024-01-06: qty 2, 28d supply, fill #4

## 2023-06-25 NOTE — Assessment & Plan Note (Signed)
Patient encouraged to maintain heart healthy diet, regular exercise, adequate sleep. Consider daily probiotics. Take medications as prescribed. Labs ordered and reviewed. Given and reviewed copy of ACP documents from [place] Secretary of State and encouraged to complete and return 

## 2023-06-25 NOTE — Progress Notes (Unsigned)
 Subjective:    Patient ID: Barry Anthony, male    DOB: 11/09/1960, 63 y.o.   MRN: [id number]  Chief Complaint  Patient presents with  . Annual Exam    Patient presents today for physical exam.    HPI Discussed the use of AI scribe software for clinical note transcription with the patient, who gave verbal consent to proceed.  History of Present Illness Barry Anthony is a 63 year old male who presents for follow-up regarding weight management and medication adjustment.  He has lost 30 pounds since the last visit, attributing this to the medication regimen started in October. He has been on the lowest doses of his medication for about nine months and is currently on a 7.5 mg dose, which he plans to pick up today. He wants to increase the dose to 10 mg next month. He experiences minimal gastrointestinal side effects, describing them as 'minor' and manageable.  He notes significant improvement in his overall health, including reduced snoring, improved blood pressure, and decreased joint pain. His weight has decreased from 320 pounds to 242 pounds, and he feels better overall, with less 'food noise' and improved breathing. He generally adheres to a cleaner diet, which he believes enhances the medication's effectiveness, though he occasionally deviates. He reflects on the physical relief of not carrying excess weight.  He has a history of leg cramps, which have improved by drinking coconut water nightly. He notes an 80% reduction in cramps, though they worsen with sweating during physical activity.  He has a history of a hydrocele, which he has opted not to treat surgically due to concerns about potential numbness post-surgery. The hydrocele is occasionally annoying, but he prefers to tolerate it rather than pursue surgery, and he has not noticed any change with weight loss.  No recent emergency room visits for chest or abdominal pain. No significant changes in family history, though his mother  has experienced falls and is now in assisted living, where she is doing better socially and physically.  He has a stable nodule on his neck, which has been present for years without change or symptoms. He experiences occasional nocturia but not consistently. He reports improved sleep and reduced snoring.    Past Medical History:  Diagnosis Date  . Allergic rhinitis   . Allergy   . Bilateral hydrocele 12/08/2012  . GERD (gastroesophageal reflux disease)   . History of chicken pox   . Hyperglycemia 08/15/2008   Qualifier: Diagnosis of  By: Marthe Slain    . Hyperlipidemia   . Hypertension   . Lipoma of shoulder   . Preventative health care 02/12/2014  . Testicular pain, left 08/04/2016    Past Surgical History:  Procedure Laterality Date  . EYE SURGERY    . HERNIA REPAIR     groin  . KNEE SURGERY  1981   right knee  . TONSILLECTOMY  1969  . WISDOM TOOTH EXTRACTION      Family History  Problem Relation Age of Onset  . Thyroid  cancer Father   . Hypertension Father   . Coronary artery disease Father   . Hyperlipidemia Father   . Alcohol abuse Father        smoker  . Heart disease Father   . Hypothyroidism Mother   . Arthritis Mother   . Dementia Maternal Aunt   . Dementia Maternal Uncle   . Cancer Maternal Grandmother        breast  . Heart disease  Paternal Grandmother        heart disease  . Other Other        no colon cancer, no prostate canceer  . Colon cancer Neg Hx     Social History   Socioeconomic History  . Marital status: Divorced    Spouse name: Not on file  . Number of children: Not on file  . Years of education: Not on file  . Highest education level: Bachelor's degree (e.g., BA, AB, BS)  Occupational History  . Not on file  Tobacco Use  . Smoking status: Never  . Smokeless tobacco: Never  Vaping Use  . Vaping status: Never Used  Substance and Sexual Activity  . Alcohol use: Yes    Comment: 2 drinks / week  . Drug use: No  . Sexual  activity: Yes    Comment: lives with wife, works as a Games developer at [employer]. no major dietary restrictions  Other Topics Concern  . Not on file  Social History Narrative   Occupation:  Parts mgr   Married 16 yrs    2 children  10, 12    Never Smoked    Alcohol use-yes (social)    Social Drivers of Health   Financial Resource Strain: Low Risk  (06/23/2023)   Overall Financial Resource Strain (CARDIA)   . Difficulty of Paying Living Expenses: Not hard at all  Food Insecurity: No Food Insecurity (06/23/2023)   Hunger Vital Sign   . Worried About Programme researcher, broadcasting/film/video in the Last Year: Never true   . Ran Out of Food in the Last Year: Never true  Transportation Needs: No Transportation Needs (06/23/2023)   PRAPARE - Transportation   . Lack of Transportation (Medical): No   . Lack of Transportation (Non-Medical): No  Physical Activity: Insufficiently Active (06/23/2023)   Exercise Vital Sign   . Days of Exercise per Week: 4 days   . Minutes of Exercise per Session: 30 min  Stress: No Stress Concern Present (06/23/2023)   [employer] of Occupational Health - Occupational Stress Questionnaire   . Feeling of Stress: Only a little  Social Connections: Socially Isolated (06/23/2023)   Social Connection and Isolation Panel   . Frequency of Communication with Friends and Family: More than three times a week   . Frequency of Social Gatherings with Friends and Family: Twice a week   . Attends Religious Services: Never   . Active Member of Clubs or Organizations: No   . Attends Banker Meetings: Not on file   . Marital Status: Divorced  Catering manager Violence: Not on file    Outpatient Medications Prior to Visit  Medication Sig Dispense Refill  . albuterol  (PROAIR  HFA) 108 (90 Base) MCG/ACT inhaler Inhale 2 puffs by mouth into the lungs daily as needed. 6.7 g 3  . allopurinol  (ZYLOPRIM ) 100 MG tablet Take 1 tablet (100 mg total) by mouth daily. 90 tablet 1   . atorvastatin  (LIPITOR) 80 MG tablet Take 1 tablet (80 mg total) by mouth daily. 90 tablet 1  . colesevelam  (WELCHOL ) 625 MG tablet Take 3 tablets (1,875 mg total) by mouth 2 (two) times daily with a meal. 540 tablet 1  . ezetimibe  (ZETIA ) 10 MG tablet Take 1 tablet (10 mg total) by mouth daily. 90 tablet 1  . fluticasone  (FLONASE ) 50 MCG/ACT nasal spray Place 2 sprays into both nostrils daily as needed. 48 g 3  . gabapentin  (NEURONTIN ) 300 MG capsule Take 1 capsule (  300 mg total) by mouth 2 (two) times daily as needed.    . lisinopril  (ZESTRIL ) 40 MG tablet Take 1 tablet (40 mg total) by mouth daily. 90 tablet 1  . nebivolol  (BYSTOLIC ) 10 MG tablet Take 1 tablet (10 mg total) by mouth daily. 90 tablet 1  . tirzepatide  (MOUNJARO ) 7.5 MG/0.5ML Pen Inject 7.5 mg into the skin once a week. 6 mL 1  . sildenafil  (REVATIO ) 20 MG tablet TAKE ONE TO FIVE TABLETS BY MOUTH ONCE A DAY AS NEEDED 150 tablet 1   No facility-administered medications prior to visit.    No Known Allergies  Review of Systems  Constitutional:  Negative for chills, fever and malaise/fatigue.  HENT:  Negative for congestion and hearing loss.   Eyes:  Negative for blurred vision and discharge.  Respiratory:  Negative for cough, sputum production and shortness of breath.   Cardiovascular:  Negative for chest pain, palpitations and leg swelling.  Gastrointestinal:  Negative for abdominal pain, blood in stool, constipation, diarrhea, heartburn, nausea and vomiting.  Genitourinary:  Negative for dysuria, frequency, hematuria and urgency.  Musculoskeletal:  Positive for myalgias. Negative for back pain and falls.  Skin:  Negative for rash.  Neurological:  Negative for dizziness, sensory change, loss of consciousness, weakness and headaches.  Endo/Heme/Allergies:  Negative for environmental allergies. Does not bruise/bleed easily.  Psychiatric/Behavioral:  Negative for depression and suicidal ideas. The patient is not  nervous/anxious and does not have insomnia.        Objective:    Physical Exam Vitals reviewed.  Constitutional:      Appearance: Normal appearance. He is not ill-appearing.  HENT:     Head: Normocephalic and atraumatic.     Nose: Nose normal.   Eyes:     Conjunctiva/sclera: Conjunctivae normal.    Cardiovascular:     Rate and Rhythm: Normal rate.     Pulses: Normal pulses.     Heart sounds: Normal heart sounds. No murmur heard. Pulmonary:     Effort: Pulmonary effort is normal.     Breath sounds: Normal breath sounds. No wheezing.  Abdominal:     Palpations: Abdomen is soft. There is no mass.     Tenderness: There is no abdominal tenderness.   Musculoskeletal:        General: Swelling present. No tenderness.       Arms:     Cervical back: Normal range of motion.     Right lower leg: No edema.     Left lower leg: No edema.   Skin:    General: Skin is warm and dry.   Neurological:     General: No focal deficit present.     Mental Status: He is alert and oriented to person, place, and time.   Psychiatric:        Mood and Affect: Mood normal.    BP 116/66   Pulse 77   Resp 16   Ht 5' 10 (1.778 m)   Wt 242 lb 12.8 oz (110.1 kg)   SpO2 96%   BMI 34.84 kg/m  Wt Readings from Last 3 Encounters:  06/25/23 242 lb 12.8 oz (110.1 kg)  01/13/23 272 lb 12.8 oz (123.7 kg)  10/09/22 294 lb 3.2 oz (133.4 kg)    Diabetic Foot Exam - Simple   No data filed    Lab Results  Component Value Date   WBC 7.9 01/13/2023   HGB 15.0 01/13/2023   HCT 44.2 01/13/2023   PLT 236.0 01/13/2023  GLUCOSE 89 01/13/2023   CHOL 103 01/13/2023   TRIG 300.0 (H) 01/13/2023   HDL 28.30 (L) 01/13/2023   LDLDIRECT 74.0 04/28/2019   LDLCALC 15 01/13/2023   ALT 13 01/13/2023   AST 16 01/13/2023   NA 140 01/13/2023   K 4.3 01/13/2023   CL 102 01/13/2023   CREATININE 1.15 01/13/2023   BUN 16 01/13/2023   CO2 30 01/13/2023   TSH 1.71 01/13/2023   PSA 1.28 11/01/2019   HGBA1C  6.1 01/13/2023   MICROALBUR 0.7 01/13/2023    Lab Results  Component Value Date   TSH 1.71 01/13/2023   Lab Results  Component Value Date   WBC 7.9 01/13/2023   HGB 15.0 01/13/2023   HCT 44.2 01/13/2023   MCV 87.9 01/13/2023   PLT 236.0 01/13/2023   Lab Results  Component Value Date   NA 140 01/13/2023   K 4.3 01/13/2023   CO2 30 01/13/2023   GLUCOSE 89 01/13/2023   BUN 16 01/13/2023   CREATININE 1.15 01/13/2023   BILITOT 0.6 01/13/2023   ALKPHOS 67 01/13/2023   AST 16 01/13/2023   ALT 13 01/13/2023   PROT 6.4 01/13/2023   ALBUMIN 4.1 01/13/2023   CALCIUM  9.3 01/13/2023   GFR 69.26 01/13/2023   Lab Results  Component Value Date   CHOL 103 01/13/2023   Lab Results  Component Value Date   HDL 28.30 (L) 01/13/2023   Lab Results  Component Value Date   LDLCALC 15 01/13/2023   Lab Results  Component Value Date   TRIG 300.0 (H) 01/13/2023   Lab Results  Component Value Date   CHOLHDL 4 01/13/2023   Lab Results  Component Value Date   HGBA1C 6.1 01/13/2023       Assessment & Plan:  Essential hypertension Assessment & Plan: Well controlled, no changes to meds. Encouraged heart healthy diet such as the DASH diet and exercise as tolerated.   Orders: -     TSH -     CBC with Differential/Platelet -     Comprehensive metabolic panel with GFR  Idiopathic gout, unspecified chronicity, unspecified site Assessment & Plan: Hydrate and monitor    Muscle cramp Assessment & Plan: Hydrate and monitor   Orders: -     Magnesium; Future  Type 2 diabetes mellitus with morbid obesity (HCC) Assessment & Plan: hgba1c acceptable, minimize simple carbs. Increase exercise as tolerated. Continue current meds.   Orders: -     Hemoglobin A1c  Hyperlipidemia, mixed -     Lipid panel  Nocturia -     PSA  Preventative health care Assessment & Plan: Patient encouraged to maintain heart healthy diet, regular exercise, adequate sleep. Consider daily probiotics.  Take medications as prescribed. Labs ordered and reviewed. Given and reviewed copy of ACP documents from [employer] and encouraged to complete and return.   Other orders -     Tirzepatide ; Inject 10 mg into the skin once a week.  Dispense: 6 mL; Refill: 2 -     Sildenafil  Citrate; TAKE ONE TO FIVE TABLETS BY MOUTH ONCE A DAY AS NEEDED  Dispense: 150 tablet; Refill: 1    Assessment and Plan Assessment & Plan Obesity Significant weight loss from 320 lbs to 242 lbs with Tirzepatide  (Mounjaro ) and lifestyle changes. Improved symptoms and blood pressure control. Tolerated medication well. Discussed dietary influence on medication effectiveness and potential lifelong use. - Prescribe Tirzepatide  (Mounjaro ) 10 mg after completing 7.5 mg. - Educated on maintaining a  healthy diet to minimize side effects.  Type 2 Diabetes Mellitus Weight loss with Tirzepatide  aids diabetes control. Blood work needed to assess current status. - Order blood work including A1c.  Hypertension Blood pressure well-controlled with weight loss contributing to improvement. - Continue current antihypertensive regimen. - Encourage regular blood pressure monitoring.  Muscle Cramp Improved with coconut water. More pronounced with sweating and activity. Discussed alternative electrolyte options. - Continue using coconut water for electrolyte replenishment. - Consider Scratch rehydration powder as an alternative.  Hydrocele Asymptomatic hydrocele. Opted against surgery due to potential complications. No improvement with weight loss.  General Health Maintenance Due for colonoscopy next year. Discussed PSA testing value. Concerns about healthcare coverage with retirement. - Order PSA test. - Schedule colonoscopy for next year. - Discuss retirement plans and potential healthcare coverage options.  Follow-up Scheduled to monitor effects of increased Mounjaro  dose and overall health. - Schedule follow-up  appointment in four months. - Review lab results and adjust treatment as necessary.     Randie Bustle, MD

## 2023-06-25 NOTE — Patient Instructions (Signed)
 Skratch rehydration powder order at [email or web address] or [employer] [facility] 73-61 Years Old, Male Preventive care refers to lifestyle choices and visits with your health care provider that can promote health and wellness. Preventive care visits are also called wellness exams. What can I expect for my preventive care visit? Counseling During your preventive care visit, your health care provider may ask about your: Medical history, including: Past medical problems. Family medical history. Current health, including: Emotional well-being. Home life and relationship well-being. Sexual activity. Lifestyle, including: Alcohol, nicotine or tobacco, and drug use. Access to firearms. Diet, exercise, and sleep habits. Safety issues such as seatbelt and bike helmet use. Sunscreen use. Work and work Astronomer. Physical exam Your health care provider will check your: Height and weight. These may be used to calculate your BMI (body mass index). BMI is a measurement that tells if you are at a healthy weight. Waist circumference. This measures the distance around your waistline. This measurement also tells if you are at a healthy weight and may help predict your risk of certain diseases, such as type 2 diabetes and high blood pressure. Heart rate and blood pressure. Body temperature. Skin for abnormal spots. What immunizations do I need?  Vaccines are usually given at various ages, according to a schedule. Your health care provider will recommend vaccines for you based on your age, medical history, and lifestyle or other factors, such as travel or where you work. What tests do I need? Screening Your health care provider may recommend screening tests for certain conditions. This may include: Lipid and cholesterol levels. Diabetes screening. This is done by checking your blood sugar (glucose) after you have not eaten for a while (fasting). Hepatitis B test. Hepatitis C test. HIV (human  immunodeficiency virus) test. STI (sexually transmitted infection) testing, if you are at risk. Lung cancer screening. Prostate cancer screening. Colorectal cancer screening. Talk with your health care provider about your test results, treatment options, and if necessary, the need for more tests. Follow these instructions at home: Eating and drinking  Eat a diet that includes fresh fruits and vegetables, whole grains, lean protein, and low-fat dairy products. Take vitamin and mineral supplements as recommended by your health care provider. Do not drink alcohol if your health care provider tells you not to drink. If you drink alcohol: Limit how much you have to 0-2 drinks a day. Know how much alcohol is in your drink. In the [place] one drink equals one 12 oz bottle of beer (355 mL), one 5 oz glass of wine (148 mL), or one 1 oz glass of hard liquor (44 mL). Lifestyle Brush your teeth every morning and night with fluoride toothpaste. Floss one time each day. Exercise for at least 30 minutes 5 or more days each week. Do not use any products that contain nicotine or tobacco. These products include cigarettes, chewing tobacco, and vaping devices, such as e-cigarettes. If you need help quitting, ask your health care provider. Do not use drugs. If you are sexually active, practice safe sex. Use a condom or other form of protection to prevent STIs. Take aspirin only as told by your health care provider. Make sure that you understand how much to take and what form to take. Work with your health care provider to find out whether it is safe and beneficial for you to take aspirin daily. Find healthy ways to manage stress, such as: Meditation, yoga, or listening to music. Journaling. Talking to a trusted person. Spending time with friends  and family. Minimize exposure to UV radiation to reduce your risk of skin cancer. Safety Always wear your seat belt while driving or riding in a vehicle. Do not  drive: If you have been drinking alcohol. Do not ride with someone who has been drinking. When you are tired or distracted. While texting. If you have been using any mind-altering substances or drugs. Wear a helmet and other protective equipment during sports activities. If you have firearms in your house, make sure you follow all gun safety procedures. What's next? Go to your health care provider once a year for an annual wellness visit. Ask your health care provider how often you should have your eyes and teeth checked. Stay up to date on all vaccines. This information is not intended to replace advice given to you by your health care provider. Make sure you discuss any questions you have with your health care provider. Document Revised: 06/20/2020 Document Reviewed: 06/20/2020 Elsevier Patient Education  2024 [employer].

## 2023-06-26 ENCOUNTER — Ambulatory Visit: Payer: Self-pay | Admitting: Family Medicine

## 2023-06-26 LAB — CBC WITH DIFFERENTIAL/PLATELET
Basophils Absolute: 0.1 10*3/uL (ref 0.0–0.1)
Basophils Relative: 0.9 % (ref 0.0–3.0)
Eosinophils Absolute: 0.2 10*3/uL (ref 0.0–0.7)
Eosinophils Relative: 2.5 % (ref 0.0–5.0)
HCT: 44.4 % (ref 39.0–52.0)
Hemoglobin: 14.9 g/dL (ref 13.0–17.0)
Lymphocytes Relative: 27.1 % (ref 12.0–46.0)
Lymphs Abs: 2.5 10*3/uL (ref 0.7–4.0)
MCHC: 33.7 g/dL (ref 30.0–36.0)
MCV: 87.4 fl (ref 78.0–100.0)
Monocytes Absolute: 0.7 10*3/uL (ref 0.1–1.0)
Monocytes Relative: 7.2 % (ref 3.0–12.0)
Neutro Abs: 5.8 10*3/uL (ref 1.4–7.7)
Neutrophils Relative %: 62.3 % (ref 43.0–77.0)
Platelets: 253 10*3/uL (ref 150.0–400.0)
RBC: 5.08 Mil/uL (ref 4.22–5.81)
RDW: 13.7 % (ref 11.5–15.5)
WBC: 9.4 10*3/uL (ref 4.0–10.5)

## 2023-06-26 LAB — LIPID PANEL
Cholesterol: 80 mg/dL (ref 0–200)
HDL: 31.6 mg/dL — ABNORMAL LOW (ref >39.00)
LDL Cholesterol: 22 mg/dL (ref 0–99)
NonHDL: 48.18
Total CHOL/HDL Ratio: 3
Triglycerides: 133 mg/dL (ref 0.0–149.0)
VLDL: 26.6 mg/dL (ref 0.0–40.0)

## 2023-06-26 LAB — MAGNESIUM: Magnesium: 2 mg/dL (ref 1.5–2.5)

## 2023-06-26 LAB — COMPREHENSIVE METABOLIC PANEL WITH GFR
ALT: 17 U/L (ref 0–53)
AST: 22 U/L (ref 0–37)
Albumin: 4.5 g/dL (ref 3.5–5.2)
Alkaline Phosphatase: 48 U/L (ref 39–117)
BUN: 22 mg/dL (ref 6–23)
CO2: 29 meq/L (ref 19–32)
Calcium: 9.5 mg/dL (ref 8.4–10.5)
Chloride: 101 meq/L (ref 96–112)
Creatinine, Ser: 1.35 mg/dL (ref 0.40–1.50)
GFR: 56.96 mL/min — ABNORMAL LOW (ref >60.00)
Glucose, Bld: 87 mg/dL (ref 70–99)
Potassium: 4.9 meq/L (ref 3.5–5.1)
Sodium: 138 meq/L (ref 135–145)
Total Bilirubin: 0.7 mg/dL (ref 0.2–1.2)
Total Protein: 6.8 g/dL (ref 6.0–8.3)

## 2023-06-26 LAB — HEMOGLOBIN A1C: Hgb A1c MFr Bld: 5.8 % (ref 4.6–6.5)

## 2023-06-26 LAB — TSH: TSH: 1.56 u[IU]/mL (ref 0.35–5.50)

## 2023-06-26 LAB — PSA: PSA: 2.14 ng/mL (ref 0.10–4.00)

## 2023-07-17 ENCOUNTER — Other Ambulatory Visit (HOSPITAL_BASED_OUTPATIENT_CLINIC_OR_DEPARTMENT_OTHER): Payer: Self-pay

## 2023-07-17 ENCOUNTER — Other Ambulatory Visit: Payer: Self-pay | Admitting: Family Medicine

## 2023-07-17 MED ORDER — NEBIVOLOL HCL 10 MG PO TABS
10.0000 mg | ORAL_TABLET | Freq: Every day | ORAL | 1 refills | Status: DC
  Filled 2023-07-17: qty 90, 90d supply, fill #0
  Filled 2023-10-10: qty 90, 90d supply, fill #1

## 2023-09-08 ENCOUNTER — Other Ambulatory Visit (HOSPITAL_BASED_OUTPATIENT_CLINIC_OR_DEPARTMENT_OTHER): Payer: Self-pay

## 2023-09-28 ENCOUNTER — Other Ambulatory Visit (HOSPITAL_BASED_OUTPATIENT_CLINIC_OR_DEPARTMENT_OTHER): Payer: Self-pay

## 2023-09-29 ENCOUNTER — Other Ambulatory Visit: Payer: Self-pay

## 2023-10-11 ENCOUNTER — Other Ambulatory Visit (HOSPITAL_BASED_OUTPATIENT_CLINIC_OR_DEPARTMENT_OTHER): Payer: Self-pay

## 2023-10-19 ENCOUNTER — Other Ambulatory Visit (HOSPITAL_COMMUNITY): Payer: Self-pay

## 2023-10-20 ENCOUNTER — Telehealth: Payer: Self-pay | Admitting: Pharmacy Technician

## 2023-10-20 NOTE — Telephone Encounter (Signed)
 Pharmacy Patient Advocate Encounter   Received notification from CoverMyMeds that prior authorization for Mounjaro  2.5MG /0.5ML auto-injectors is due for renewal.   Insurance verification completed.   The patient is insured through [facility].  Action: Medication has been discontinued. Archived Key: [id number]  **Patient is on a higher strength of Mounjaro .**

## 2023-10-25 NOTE — Assessment & Plan Note (Signed)
 Encouraged DASH or MIND diet, decrease po intake and increase exercise as tolerated. Needs 7-8 hours of sleep nightly. Avoid trans fats, eat small, frequent meals every 4-5 hours with lean proteins, complex carbs and healthy fats. Minimize simple carbs, high fat foods and processed foods

## 2023-10-25 NOTE — Assessment & Plan Note (Signed)
 hgba1c acceptable, minimize simple carbs. Increase exercise as tolerated. Continue current meds

## 2023-10-25 NOTE — Assessment & Plan Note (Signed)
 Well controlled, no changes to meds. Encouraged heart healthy diet such as the DASH diet and exercise as tolerated.

## 2023-10-25 NOTE — Assessment & Plan Note (Signed)
 Hydrate and monitor

## 2023-10-25 NOTE — Progress Notes (Signed)
 Subjective:    Patient ID: Barry Anthony, male    DOB: December 16, 1960, 63 y.o.   MRN: [id number]  No chief complaint on file.   HPI Discussed the use of AI scribe software for clinical note transcription with the patient, who gave verbal consent to proceed.  History of Present Illness Barry Anthony is a 63 year old male who presents for a routine follow-up visit.  He has experienced a weight loss of four to four and a half pounds, which he considers positive. His blood pressure remains stable, and his blood sugar levels are well-controlled, with no readings over 200 or below 70. He does not monitor his blood sugar at home due to the lack of a glucometer.  No symptoms of increased urination, excessive thirst, or unexplained abdominal pain. He has not experienced a gout flare in years, and his uric acid levels are stable, with the last reading at 7, which is on the higher side of normal.  He recently received a flu shot and plans to get a COVID-19 vaccine. He discusses his mother's health, noting she is 1 years old, resides at [facility], and continues to smoke. She recently had a DEXA scan and received Prolia shots.  He mentions improvements in symptoms such as heartburn and knee pain with his weight loss, and he reports better sleep. He acknowledges that dietary choices impact his well-being, stating that 'the better, the cleaner you eat, the easier it'll be.'    Past Medical History:  Diagnosis Date   Allergic rhinitis    Allergy    Bilateral hydrocele 12/08/2012   GERD (gastroesophageal reflux disease)    History of chicken pox    Hyperglycemia 08/15/2008   Qualifier: Diagnosis of  By: Georgian ROSALEA CHARM Lamar     Hyperlipidemia    Hypertension    Lipoma of shoulder    Preventative health care 02/12/2014   Testicular pain, left 08/04/2016    Past Surgical History:  Procedure Laterality Date   EYE SURGERY     HERNIA REPAIR     groin   KNEE SURGERY  1981   right knee    TONSILLECTOMY  1969   WISDOM TOOTH EXTRACTION      Family History  Problem Relation Age of Onset   Thyroid  cancer Father    Hypertension Father    Coronary artery disease Father    Hyperlipidemia Father    Alcohol abuse Father        smoker   Heart disease Father    Hypothyroidism Mother    Arthritis Mother    Dementia Maternal Aunt    Dementia Maternal Uncle    Cancer Maternal Grandmother        breast   Heart disease Paternal Grandmother        heart disease   Other Other        no colon cancer, no prostate canceer   Colon cancer Neg Hx     Social History   Socioeconomic History   Marital status: Divorced    Spouse name: Not on file   Number of children: Not on file   Years of education: Not on file   Highest education level: Bachelor's degree (e.g., BA, AB, BS)  Occupational History   Not on file  Tobacco Use   Smoking status: Never   Smokeless tobacco: Never  Vaping Use   Vaping status: Never Used  Substance and Sexual Activity   Alcohol use: Yes  Comment: 2 drinks / week   Drug use: No   Sexual activity: Yes    Comment: lives with wife, works as a Games developer at [employer]. no major dietary restrictions  Other Topics Concern   Not on file  Social History Narrative   Occupation:  Parts mgr   Married 16 yrs    2 children  10, 12    Never Smoked    Alcohol use-yes (social)    Social Drivers of Corporate investment banker Strain: Low Risk  (10/21/2023)   Overall Financial Resource Strain (CARDIA)    Difficulty of Paying Living Expenses: Not hard at all  Food Insecurity: No Food Insecurity (10/21/2023)   Hunger Vital Sign    Worried About Running Out of Food in the Last Year: Never true    Ran Out of Food in the Last Year: Never true  Transportation Needs: No Transportation Needs (10/21/2023)   PRAPARE - Administrator, Civil Service (Medical): No    Lack of Transportation (Non-Medical): No  Physical Activity:  Insufficiently Active (10/21/2023)   Exercise Vital Sign    Days of Exercise per Week: 3 days    Minutes of Exercise per Session: 30 min  Stress: No Stress Concern Present (10/21/2023)   [employer] of Occupational Health - Occupational Stress Questionnaire    Feeling of Stress: Only a little  Social Connections: Moderately Isolated (10/21/2023)   Social Connection and Isolation Panel    Frequency of Communication with Friends and Family: More than three times a week    Frequency of Social Gatherings with Friends and Family: Twice a week    Attends Religious Services: 1 to 4 times per year    Active Member of [employer] or Organizations: No    Attends Engineer, structural: Not on file    Marital Status: Divorced  Intimate Partner Violence: Not on file    Outpatient Medications Prior to Visit  Medication Sig Dispense Refill   albuterol  (PROAIR  HFA) 108 (90 Base) MCG/ACT inhaler Inhale 2 puffs by mouth into the lungs daily as needed. 6.7 g 3   allopurinol  (ZYLOPRIM ) 100 MG tablet Take 1 tablet (100 mg total) by mouth daily. 90 tablet 1   atorvastatin  (LIPITOR) 80 MG tablet Take 1 tablet (80 mg total) by mouth daily. 90 tablet 1   colesevelam  (WELCHOL ) 625 MG tablet Take 3 tablets (1,875 mg total) by mouth 2 (two) times daily with a meal. 540 tablet 1   ezetimibe  (ZETIA ) 10 MG tablet Take 1 tablet (10 mg total) by mouth daily. 90 tablet 1   fluticasone  (FLONASE ) 50 MCG/ACT nasal spray Place 2 sprays into both nostrils daily as needed. 48 g 3   gabapentin  (NEURONTIN ) 300 MG capsule Take 1 capsule (300 mg total) by mouth 2 (two) times daily as needed.     lisinopril  (ZESTRIL ) 40 MG tablet Take 1 tablet (40 mg total) by mouth daily. 90 tablet 1   nebivolol  (BYSTOLIC ) 10 MG tablet Take 1 tablet (10 mg total) by mouth daily. 90 tablet 1   sildenafil  (REVATIO ) 20 MG tablet TAKE ONE TO FIVE TABLETS BY MOUTH ONCE A DAY AS NEEDED 150 tablet 1   tirzepatide  (MOUNJARO ) 10 MG/0.5ML Pen  Inject 10 mg into the skin once a week. 6 mL 2   tirzepatide  (MOUNJARO ) 7.5 MG/0.5ML Pen Inject 7.5 mg into the skin once a week. 6 mL 1   No facility-administered medications prior to visit.  No Known Allergies  Review of Systems  Constitutional:  Negative for fever and malaise/fatigue.  HENT:  Negative for congestion.   Eyes:  Negative for blurred vision.  Respiratory:  Negative for shortness of breath.   Cardiovascular:  Negative for chest pain, palpitations and leg swelling.  Gastrointestinal:  Negative for abdominal pain, blood in stool and nausea.  Genitourinary:  Negative for dysuria and frequency.  Musculoskeletal:  Negative for falls.  Skin:  Negative for rash.  Neurological:  Negative for dizziness, loss of consciousness and headaches.  Endo/Heme/Allergies:  Negative for environmental allergies.  Psychiatric/Behavioral:  Negative for depression. The patient is not nervous/anxious.        Objective:    Physical Exam Vitals reviewed.  Constitutional:      Appearance: Normal appearance. He is not ill-appearing.  HENT:     Head: Normocephalic and atraumatic.     Nose: Nose normal.  Eyes:     Conjunctiva/sclera: Conjunctivae normal.  Cardiovascular:     Rate and Rhythm: Normal rate.     Pulses: Normal pulses.     Heart sounds: Normal heart sounds. No murmur heard. Pulmonary:     Effort: Pulmonary effort is normal.     Breath sounds: Normal breath sounds. No wheezing.  Abdominal:     Palpations: Abdomen is soft. There is no mass.     Tenderness: There is no abdominal tenderness.  Musculoskeletal:     Cervical back: Normal range of motion.     Right lower leg: No edema.     Left lower leg: No edema.  Skin:    General: Skin is warm and dry.  Neurological:     General: No focal deficit present.     Mental Status: He is alert and oriented to person, place, and time.  Psychiatric:        Mood and Affect: Mood normal.    There were no vitals taken for this  visit. Wt Readings from Last 3 Encounters:  06/25/23 242 lb 12.8 oz (110.1 kg)  01/13/23 272 lb 12.8 oz (123.7 kg)  10/09/22 294 lb 3.2 oz (133.4 kg)    Diabetic Foot Exam - Simple   No data filed    Lab Results  Component Value Date   WBC 9.4 06/25/2023   HGB 14.9 06/25/2023   HCT 44.4 06/25/2023   PLT 253.0 06/25/2023   GLUCOSE 87 06/25/2023   CHOL 80 06/25/2023   TRIG 133.0 06/25/2023   HDL 31.60 (L) 06/25/2023   LDLDIRECT 74.0 04/28/2019   LDLCALC 22 06/25/2023   ALT 17 06/25/2023   AST 22 06/25/2023   NA 138 06/25/2023   K 4.9 06/25/2023   CL 101 06/25/2023   CREATININE 1.35 06/25/2023   BUN 22 06/25/2023   CO2 29 06/25/2023   TSH 1.56 06/25/2023   PSA 2.14 06/25/2023   HGBA1C 5.8 06/25/2023    Lab Results  Component Value Date   TSH 1.56 06/25/2023   Lab Results  Component Value Date   WBC 9.4 06/25/2023   HGB 14.9 06/25/2023   HCT 44.4 06/25/2023   MCV 87.4 06/25/2023   PLT 253.0 06/25/2023   Lab Results  Component Value Date   NA 138 06/25/2023   K 4.9 06/25/2023   CO2 29 06/25/2023   GLUCOSE 87 06/25/2023   BUN 22 06/25/2023   CREATININE 1.35 06/25/2023   BILITOT 0.7 06/25/2023   ALKPHOS 48 06/25/2023   AST 22 06/25/2023   ALT 17 06/25/2023   PROT  6.8 06/25/2023   ALBUMIN 4.5 06/25/2023   CALCIUM  9.5 06/25/2023   GFR 56.96 (L) 06/25/2023   Lab Results  Component Value Date   CHOL 80 06/25/2023   Lab Results  Component Value Date   HDL 31.60 (L) 06/25/2023   Lab Results  Component Value Date   LDLCALC 22 06/25/2023   Lab Results  Component Value Date   TRIG 133.0 06/25/2023   Lab Results  Component Value Date   CHOLHDL 3 06/25/2023   Lab Results  Component Value Date   HGBA1C 5.8 06/25/2023       Assessment & Plan:  Essential hypertension Assessment & Plan: Well controlled, no changes to meds. Encouraged heart healthy diet such as the DASH diet and exercise as tolerated.    Idiopathic gout, unspecified  chronicity, unspecified site Assessment & Plan: Hydrate and monitor    Hyperlipidemia, mixed Assessment & Plan: Encourage heart healthy diet such as MIND or DASH diet, increase exercise, avoid trans fats, simple carbohydrates and processed foods, consider a krill or fish or flaxseed oil cap daily. Tolerating Atorvastatin    Muscle cramp Assessment & Plan: Hydrate and monitor    Type 2 diabetes mellitus with morbid obesity (HCC) Assessment & Plan: hgba1c acceptable, minimize simple carbs. Increase exercise as tolerated. Continue current meds.    Obesity, unspecified class, unspecified obesity type, unspecified whether serious comorbidity present Assessment & Plan: Encouraged DASH or MIND diet, decrease po intake and increase exercise as tolerated. Needs 7-8 hours of sleep nightly. Avoid trans fats, eat small, frequent meals every 4-5 hours with lean proteins, complex carbs and healthy fats. Minimize simple carbs, high fat foods and processed foods     Assessment and Plan Assessment & Plan Type 2 diabetes mellitus Type 2 diabetes mellitus is well-controlled with no blood sugar readings over 200 or below 70. No symptoms of hyperglycemia or hypoglycemia. Weight loss is progressing positively, aiding diabetes management. - Prescribe glucometer with lancets and test strips to pharmacy downstairs. - Instruct to check blood sugar daily and as needed for educational purposes. - Order standard blood work to monitor glucose levels.  Essential hypertension Blood pressure is well-controlled. No recent issues reported.  Obesity Weight loss of 4.5 pounds with a positive trajectory over time. - Encourage continued weight loss through diet and exercise.  Mixed hyperlipidemia No specific discussion regarding mixed hyperlipidemia in this encounter.  Idiopathic gout No gout flares for years. Uric acid levels stable, last reading at 7, which is higher normal but consistent. Monitoring advised  due to potential association with heart disease progression. - Order uric acid test to monitor levels.  Recording duration: 21 minutes     Harlene Horton, MD

## 2023-10-25 NOTE — Assessment & Plan Note (Signed)
 Encourage heart healthy diet such as MIND or DASH diet, increase exercise, avoid trans fats, simple carbohydrates and processed foods, consider a krill or fish or flaxseed oil cap daily. Tolerating Atorvastatin

## 2023-10-26 ENCOUNTER — Ambulatory Visit: Admitting: Family Medicine

## 2023-10-26 ENCOUNTER — Ambulatory Visit: Payer: Self-pay | Admitting: Family Medicine

## 2023-10-26 ENCOUNTER — Other Ambulatory Visit (HOSPITAL_BASED_OUTPATIENT_CLINIC_OR_DEPARTMENT_OTHER): Payer: Self-pay

## 2023-10-26 ENCOUNTER — Encounter: Payer: Self-pay | Admitting: Family Medicine

## 2023-10-26 VITALS — BP 128/78 | HR 74 | Temp 98.0°F | Resp 18 | Ht 70.0 in | Wt 238.4 lb

## 2023-10-26 DIAGNOSIS — E1169 Type 2 diabetes mellitus with other specified complication: Secondary | ICD-10-CM

## 2023-10-26 DIAGNOSIS — I10 Essential (primary) hypertension: Secondary | ICD-10-CM | POA: Diagnosis not present

## 2023-10-26 DIAGNOSIS — M1000 Idiopathic gout, unspecified site: Secondary | ICD-10-CM

## 2023-10-26 DIAGNOSIS — R252 Cramp and spasm: Secondary | ICD-10-CM | POA: Diagnosis not present

## 2023-10-26 DIAGNOSIS — E782 Mixed hyperlipidemia: Secondary | ICD-10-CM | POA: Diagnosis not present

## 2023-10-26 DIAGNOSIS — Z23 Encounter for immunization: Secondary | ICD-10-CM | POA: Diagnosis not present

## 2023-10-26 DIAGNOSIS — E669 Obesity, unspecified: Secondary | ICD-10-CM

## 2023-10-26 LAB — CBC WITH DIFFERENTIAL/PLATELET
Basophils Absolute: 0 K/uL (ref 0.0–0.1)
Basophils Relative: 0.7 % (ref 0.0–3.0)
Eosinophils Absolute: 0.2 K/uL (ref 0.0–0.7)
Eosinophils Relative: 2.7 % (ref 0.0–5.0)
HCT: 45.3 % (ref 39.0–52.0)
Hemoglobin: 15.3 g/dL (ref 13.0–17.0)
Lymphocytes Relative: 31.3 % (ref 12.0–46.0)
Lymphs Abs: 2.1 K/uL (ref 0.7–4.0)
MCHC: 33.7 g/dL (ref 30.0–36.0)
MCV: 87.8 fl (ref 78.0–100.0)
Monocytes Absolute: 0.5 K/uL (ref 0.1–1.0)
Monocytes Relative: 7.6 % (ref 3.0–12.0)
Neutro Abs: 3.8 K/uL (ref 1.4–7.7)
Neutrophils Relative %: 57.7 % (ref 43.0–77.0)
Platelets: 235 K/uL (ref 150.0–400.0)
RBC: 5.16 Mil/uL (ref 4.22–5.81)
RDW: 13.3 % (ref 11.5–15.5)
WBC: 6.6 K/uL (ref 4.0–10.5)

## 2023-10-26 LAB — LIPID PANEL
Cholesterol: 92 mg/dL (ref 0–200)
HDL: 35.2 mg/dL — ABNORMAL LOW (ref >39.00)
LDL Cholesterol: 33 mg/dL (ref 0–99)
NonHDL: 56.82
Total CHOL/HDL Ratio: 3
Triglycerides: 117 mg/dL (ref 0.0–149.0)
VLDL: 23.4 mg/dL (ref 0.0–40.0)

## 2023-10-26 LAB — MICROALBUMIN / CREATININE URINE RATIO
Creatinine,U: 131.2 mg/dL
Microalb Creat Ratio: 7.8 mg/g (ref 0.0–30.0)
Microalb, Ur: 1 mg/dL (ref 0.0–1.9)

## 2023-10-26 LAB — COMPREHENSIVE METABOLIC PANEL WITH GFR
ALT: 15 U/L (ref 0–53)
AST: 19 U/L (ref 0–37)
Albumin: 4.3 g/dL (ref 3.5–5.2)
Alkaline Phosphatase: 51 U/L (ref 39–117)
BUN: 12 mg/dL (ref 6–23)
CO2: 29 meq/L (ref 19–32)
Calcium: 9.4 mg/dL (ref 8.4–10.5)
Chloride: 102 meq/L (ref 96–112)
Creatinine, Ser: 1.25 mg/dL (ref 0.40–1.50)
GFR: 62.32 mL/min (ref >60.00)
Glucose, Bld: 81 mg/dL (ref 70–99)
Potassium: 4.4 meq/L (ref 3.5–5.1)
Sodium: 139 meq/L (ref 135–145)
Total Bilirubin: 0.8 mg/dL (ref 0.2–1.2)
Total Protein: 6.4 g/dL (ref 6.0–8.3)

## 2023-10-26 LAB — URIC ACID: Uric Acid, Serum: 5.8 mg/dL (ref 4.0–7.8)

## 2023-10-26 LAB — TSH: TSH: 1 u[IU]/mL (ref 0.35–5.50)

## 2023-10-26 LAB — HEMOGLOBIN A1C: Hgb A1c MFr Bld: 5.7 % (ref 4.6–6.5)

## 2023-10-26 MED ORDER — ACCU-CHEK GUIDE W/DEVICE KIT
1.0000 | PACK | 1 refills | Status: AC
  Filled 2023-10-26: qty 1, 30d supply, fill #0

## 2023-10-26 MED ORDER — ACCU-CHEK SOFTCLIX LANCETS MISC
1.0000 | 5 refills | Status: AC
  Filled 2023-10-26: qty 100, 25d supply, fill #0

## 2023-10-26 MED ORDER — BLOOD GLUCOSE TEST VI STRP
1.0000 | ORAL_STRIP | Freq: Every day | 5 refills | Status: AC
  Filled 2023-10-26: qty 100, 25d supply, fill #0

## 2023-10-26 MED ORDER — LANCET DEVICE MISC
1.0000 | 5 refills | Status: AC
  Filled 2023-10-26: qty 100, fill #0

## 2023-10-26 NOTE — Patient Instructions (Addendum)
 [facility] pharmacies are all walk in vaccine clinics M-F 9-4 '

## 2023-11-03 ENCOUNTER — Other Ambulatory Visit: Payer: Self-pay | Admitting: Family Medicine

## 2023-11-03 ENCOUNTER — Other Ambulatory Visit: Payer: Self-pay

## 2023-11-03 ENCOUNTER — Other Ambulatory Visit (HOSPITAL_BASED_OUTPATIENT_CLINIC_OR_DEPARTMENT_OTHER): Payer: Self-pay

## 2023-11-03 MED ORDER — EZETIMIBE 10 MG PO TABS
10.0000 mg | ORAL_TABLET | Freq: Every day | ORAL | 1 refills | Status: AC
  Filled 2023-11-03: qty 90, 90d supply, fill #0
  Filled 2024-02-03: qty 90, 90d supply, fill #1

## 2023-12-21 ENCOUNTER — Other Ambulatory Visit: Payer: Self-pay | Admitting: Family Medicine

## 2023-12-21 ENCOUNTER — Other Ambulatory Visit (HOSPITAL_BASED_OUTPATIENT_CLINIC_OR_DEPARTMENT_OTHER): Payer: Self-pay

## 2023-12-21 DIAGNOSIS — E782 Mixed hyperlipidemia: Secondary | ICD-10-CM

## 2023-12-21 DIAGNOSIS — I10 Essential (primary) hypertension: Secondary | ICD-10-CM

## 2023-12-21 DIAGNOSIS — M1000 Idiopathic gout, unspecified site: Secondary | ICD-10-CM

## 2023-12-21 MED ORDER — ALLOPURINOL 100 MG PO TABS
100.0000 mg | ORAL_TABLET | Freq: Every day | ORAL | 1 refills | Status: AC
  Filled 2023-12-21: qty 90, 90d supply, fill #0

## 2023-12-21 MED ORDER — ATORVASTATIN CALCIUM 80 MG PO TABS
80.0000 mg | ORAL_TABLET | Freq: Every day | ORAL | 1 refills | Status: AC
  Filled 2023-12-21: qty 90, 90d supply, fill #0

## 2023-12-21 MED ORDER — LISINOPRIL 40 MG PO TABS
40.0000 mg | ORAL_TABLET | Freq: Every day | ORAL | 1 refills | Status: AC
  Filled 2023-12-21: qty 90, 90d supply, fill #0

## 2024-01-06 ENCOUNTER — Other Ambulatory Visit: Payer: Self-pay | Admitting: Family Medicine

## 2024-01-06 ENCOUNTER — Other Ambulatory Visit (HOSPITAL_BASED_OUTPATIENT_CLINIC_OR_DEPARTMENT_OTHER): Payer: Self-pay

## 2024-01-06 ENCOUNTER — Other Ambulatory Visit: Payer: Self-pay

## 2024-01-06 DIAGNOSIS — I10 Essential (primary) hypertension: Secondary | ICD-10-CM

## 2024-01-06 MED ORDER — NEBIVOLOL HCL 10 MG PO TABS
10.0000 mg | ORAL_TABLET | Freq: Every day | ORAL | 1 refills | Status: AC
  Filled 2024-01-06: qty 90, 90d supply, fill #0

## 2024-01-26 ENCOUNTER — Encounter: Payer: Self-pay | Admitting: Family Medicine

## 2024-01-27 ENCOUNTER — Other Ambulatory Visit: Payer: Self-pay | Admitting: Family Medicine

## 2024-01-27 MED ORDER — TIRZEPATIDE 12.5 MG/0.5ML ~~LOC~~ SOAJ: 12.5000 mg | SUBCUTANEOUS | 1 refills | Status: DC

## 2024-01-28 ENCOUNTER — Other Ambulatory Visit (HOSPITAL_BASED_OUTPATIENT_CLINIC_OR_DEPARTMENT_OTHER): Payer: Self-pay

## 2024-01-28 MED ORDER — TIRZEPATIDE 12.5 MG/0.5ML ~~LOC~~ SOAJ
12.5000 mg | SUBCUTANEOUS | 0 refills | Status: AC
  Filled 2024-01-28 – 2024-02-08 (×3): qty 6, 84d supply, fill #0

## 2024-02-03 ENCOUNTER — Other Ambulatory Visit (HOSPITAL_BASED_OUTPATIENT_CLINIC_OR_DEPARTMENT_OTHER): Payer: Self-pay

## 2024-02-03 ENCOUNTER — Other Ambulatory Visit: Payer: Self-pay

## 2024-02-09 ENCOUNTER — Other Ambulatory Visit (HOSPITAL_BASED_OUTPATIENT_CLINIC_OR_DEPARTMENT_OTHER): Payer: Self-pay

## 2024-02-09 ENCOUNTER — Telehealth: Payer: Self-pay

## 2024-02-09 ENCOUNTER — Other Ambulatory Visit (HOSPITAL_COMMUNITY): Payer: Self-pay

## 2024-02-09 NOTE — Telephone Encounter (Signed)
 Pharmacy Patient Advocate Encounter   Received notification from Physician's Office that prior authorization for Mounjaro  is required/requested.   Insurance verification completed.   The patient is insured through [employer]  .   Per test claim: The current 28 day co-pay is, $25.  No PA needed at this time. This test claim was processed through [facility]- copay amounts may vary at other pharmacies due to pharmacy/plan contracts, or as the patient moves through the different stages of their insurance plan.

## 2024-03-07 ENCOUNTER — Ambulatory Visit: Admitting: Family Medicine

## 2024-03-10 ENCOUNTER — Encounter: Admitting: Family Medicine

## 2024-06-27 ENCOUNTER — Encounter: Admitting: Family Medicine
